# Patient Record
Sex: Male | Born: 1995 | Race: White | Hispanic: No | Marital: Married
Health system: Southern US, Community
[De-identification: ages and names within clinical notes are randomized; demographics above are authoritative.]

## PROBLEM LIST (undated history)

## (undated) DIAGNOSIS — F32A Depression, unspecified: Secondary | ICD-10-CM

## (undated) DIAGNOSIS — F319 Bipolar disorder, unspecified: Secondary | ICD-10-CM

## (undated) DIAGNOSIS — F419 Anxiety disorder, unspecified: Secondary | ICD-10-CM

## (undated) DIAGNOSIS — W5911XA Bitten by nonvenomous snake, initial encounter: Secondary | ICD-10-CM

## (undated) DIAGNOSIS — Z930 Tracheostomy status: Secondary | ICD-10-CM

## (undated) HISTORY — PX: ELBOW SURGERY: SHX618

## (undated) HISTORY — DX: Bipolar disorder, unspecified: F31.9

## (undated) HISTORY — PX: FOREARM SURGERY: SHX651

## (undated) HISTORY — DX: Tracheostomy status: Z93.0

## (undated) HISTORY — PX: SPLENECTOMY, TOTAL: SHX788

## (undated) HISTORY — PX: FRACTURE SURGERY: SHX138

## (undated) HISTORY — PX: SHOULDER SURGERY: SHX246

## (undated) HISTORY — PX: CRANIOTOMY: SHX93

---
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
## 2013-11-26 HISTORY — PX: FOOT SURGERY: SHX648

## 2014-05-11 ENCOUNTER — Emergency Department (HOSPITAL_COMMUNITY): Payer: 59

## 2014-05-11 ENCOUNTER — Encounter (HOSPITAL_COMMUNITY): Payer: Self-pay | Admitting: Emergency Medicine

## 2014-05-11 ENCOUNTER — Emergency Department (HOSPITAL_COMMUNITY)
Admission: EM | Admit: 2014-05-11 | Discharge: 2014-05-12 | Disposition: A | Discharge status: Home / Self Care | Payer: 59 | Attending: Emergency Medicine | Admitting: Emergency Medicine

## 2014-05-11 DIAGNOSIS — F1010 Alcohol abuse, uncomplicated: Secondary | ICD-10-CM | POA: Insufficient documentation

## 2014-05-11 DIAGNOSIS — S0003XA Contusion of scalp, initial encounter: Secondary | ICD-10-CM | POA: Insufficient documentation

## 2014-05-11 DIAGNOSIS — T148XXA Other injury of unspecified body region, initial encounter: Secondary | ICD-10-CM

## 2014-05-11 DIAGNOSIS — S199XXA Unspecified injury of neck, initial encounter: Secondary | ICD-10-CM

## 2014-05-11 DIAGNOSIS — F17200 Nicotine dependence, unspecified, uncomplicated: Secondary | ICD-10-CM | POA: Insufficient documentation

## 2014-05-11 DIAGNOSIS — IMO0002 Reserved for concepts with insufficient information to code with codable children: Secondary | ICD-10-CM | POA: Insufficient documentation

## 2014-05-11 DIAGNOSIS — S0993XA Unspecified injury of face, initial encounter: Secondary | ICD-10-CM | POA: Insufficient documentation

## 2014-05-11 DIAGNOSIS — S1093XA Contusion of unspecified part of neck, initial encounter: Principal | ICD-10-CM

## 2014-05-11 DIAGNOSIS — S0083XA Contusion of other part of head, initial encounter: Principal | ICD-10-CM | POA: Insufficient documentation
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
## 2014-05-12 MED ORDER — IBUPROFEN 600 MG PO TABS: 600.0000 mg | ORAL_TABLET | Freq: Four times a day (QID) | ORAL | Status: DC | PRN
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
## 2014-05-12 NOTE — ED Notes (Signed)
Discharge instructions reviewed with pt. Pt verbalized understanding.   

## 2015-11-27 HISTORY — PX: ORIF ANKLE FRACTURE BIMALLEOLAR: SUR920

## 2016-03-08 ENCOUNTER — Encounter (HOSPITAL_COMMUNITY): Payer: Self-pay

## 2016-03-08 ENCOUNTER — Emergency Department (HOSPITAL_COMMUNITY)
Admission: EM | Admit: 2016-03-08 | Discharge: 2016-03-09 | Disposition: A | Discharge status: Home / Self Care | Payer: No Typology Code available for payment source | Attending: Emergency Medicine | Admitting: Emergency Medicine

## 2016-03-08 ENCOUNTER — Emergency Department (HOSPITAL_COMMUNITY): Payer: No Typology Code available for payment source

## 2016-03-08 DIAGNOSIS — S022XXA Fracture of nasal bones, initial encounter for closed fracture: Secondary | ICD-10-CM

## 2016-03-08 DIAGNOSIS — F17200 Nicotine dependence, unspecified, uncomplicated: Secondary | ICD-10-CM | POA: Insufficient documentation

## 2016-03-08 DIAGNOSIS — K08419 Partial loss of teeth due to trauma, unspecified class: Secondary | ICD-10-CM | POA: Diagnosis not present

## 2016-03-08 DIAGNOSIS — Y9389 Activity, other specified: Secondary | ICD-10-CM | POA: Diagnosis not present

## 2016-03-08 DIAGNOSIS — S0181XA Laceration without foreign body of other part of head, initial encounter: Secondary | ICD-10-CM | POA: Insufficient documentation

## 2016-03-08 DIAGNOSIS — Y998 Other external cause status: Secondary | ICD-10-CM | POA: Diagnosis not present

## 2016-03-08 DIAGNOSIS — Y92410 Unspecified street and highway as the place of occurrence of the external cause: Secondary | ICD-10-CM | POA: Diagnosis not present

## 2016-03-08 DIAGNOSIS — S0992XA Unspecified injury of nose, initial encounter: Secondary | ICD-10-CM | POA: Diagnosis present

## 2016-03-08 NOTE — ED Notes (Signed)
Pt refusing IV access and phleb stick.
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
## 2016-03-09 MED ORDER — ACETAMINOPHEN 500 MG PO TABS
1000.0000 mg | ORAL_TABLET | Freq: Once | ORAL | Status: AC
  Administered 2016-03-09: 1000 mg via ORAL
  Filled 2016-03-09: qty 2
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
## 2016-03-09 NOTE — Discharge Instructions (Signed)
Nasal Fracture Mr. Hunter Cook, your CT scan results are below and show a broken nose.  See ENT within 3 days for close follow up.  Take tylenol or ibuprofen as needed for pain. If symptoms worsen, come back to the ED immediately. Thank you.   IMPRESSION: 1. Comminuted and mildly displaced nasal bone fractures bilaterally. Nasal septal fracture. Marked soft tissue swelling. 2. Negative for acute intracranial traumatic injury. Normal brain. 3. Negative for acute cervical spine fracture.   A fracture is a break in a bone. A nasal fracture is a broken nose. Minor breaks do not need treatment. Serious breaks may need surgery. HOME CARE  If directed, put ice on the injured area:  Put ice in a plastic bag.  Place a towel between your skin and the bag.  Leave the ice on for 20 minutes, 2-3 times per day.  Take over-the-counter and prescription medicines only as told by your doctor.  If your nose bleeds, sit up while you gently squeeze your nose shut for 10 minutes.  Try to not blow your nose.  Return to your normal activities as told by your doctor. Ask your doctor what activities are safe for you.  Do not play contact sports for 3-4 weeks or as told by your doctor.  Keep all follow-up visits as told by your doctor. This is important. GET HELP IF:  You have more pain or very bad pain.  You keep having nosebleeds.  The shape of your nose does not return to normal after 5 days.  You have pus coming out of your nose. GET HELP RIGHT AWAY IF:  Your nose bleeds for more than 20 minutes.  You have clear fluid draining out of your nose.  You have a grape-like swelling on the inside of your nose.  You have trouble moving your eyes.  You keep throwing up (vomiting).   This information is not intended to replace advice given to you by your health care provider. Make sure you discuss any questions you have with your health care provider.   Document Released: 08/21/2008 Document  Revised: 08/03/2015 Document Reviewed: 12/20/2014 Elsevier Interactive Patient Education [employer].

## 2016-03-09 NOTE — ED Notes (Signed)
Reported pain to Dr. Mora Bellmanni, MD gives verbal order for tylenol 1gm.

## 2016-03-09 NOTE — ED Notes (Signed)
Patient up and ambulatory without assistance. [facility] informed.

## 2016-10-16 ENCOUNTER — Emergency Department (HOSPITAL_COMMUNITY)
Admission: EM | Admit: 2016-10-16 | Discharge: 2016-10-16 | Disposition: A | Discharge status: Home / Self Care | Payer: BLUE CROSS/BLUE SHIELD | Attending: Emergency Medicine | Admitting: Emergency Medicine

## 2016-10-16 ENCOUNTER — Encounter (HOSPITAL_COMMUNITY): Payer: Self-pay

## 2016-10-16 ENCOUNTER — Emergency Department (HOSPITAL_COMMUNITY): Payer: BLUE CROSS/BLUE SHIELD

## 2016-10-16 ENCOUNTER — Ambulatory Visit (INDEPENDENT_AMBULATORY_CARE_PROVIDER_SITE_OTHER): Payer: Self-pay | Admitting: Orthopaedic Surgery

## 2016-10-16 DIAGNOSIS — Y939 Activity, unspecified: Secondary | ICD-10-CM | POA: Diagnosis not present

## 2016-10-16 DIAGNOSIS — Y929 Unspecified place or not applicable: Secondary | ICD-10-CM | POA: Diagnosis not present

## 2016-10-16 DIAGNOSIS — S82852A Displaced trimalleolar fracture of left lower leg, initial encounter for closed fracture: Secondary | ICD-10-CM | POA: Diagnosis not present

## 2016-10-16 DIAGNOSIS — Y999 Unspecified external cause status: Secondary | ICD-10-CM | POA: Insufficient documentation

## 2016-10-16 DIAGNOSIS — F17200 Nicotine dependence, unspecified, uncomplicated: Secondary | ICD-10-CM | POA: Diagnosis not present

## 2016-10-16 DIAGNOSIS — S99912A Unspecified injury of left ankle, initial encounter: Secondary | ICD-10-CM | POA: Diagnosis present

## 2016-10-16 MED ORDER — OXYCODONE-ACETAMINOPHEN 5-325 MG PO TABS
1.0000 | ORAL_TABLET | Freq: Once | ORAL | Status: AC
  Administered 2016-10-16: 1 via ORAL
  Filled 2016-10-16: qty 1

## 2016-10-16 MED ORDER — OXYCODONE-ACETAMINOPHEN 5-325 MG PO TABS: 1.0000 | ORAL_TABLET | ORAL | 0 refills | Status: DC | PRN

## 2016-10-16 MED ORDER — IBUPROFEN 600 MG PO TABS: 600.0000 mg | ORAL_TABLET | Freq: Four times a day (QID) | ORAL | 0 refills | Status: DC | PRN

## 2016-10-16 MED ORDER — ONDANSETRON HCL 4 MG/2ML IJ SOLN
4.0000 mg | Freq: Once | INTRAMUSCULAR | Status: AC
  Administered 2016-10-16: 4 mg via INTRAVENOUS
  Filled 2016-10-16: qty 2

## 2016-10-16 MED ORDER — HYDROMORPHONE HCL 2 MG/ML IJ SOLN
1.0000 mg | Freq: Once | INTRAMUSCULAR | Status: AC
  Administered 2016-10-16: 1 mg via INTRAVENOUS
  Filled 2016-10-16: qty 1

## 2016-10-16 NOTE — ED Notes (Signed)
PT ambulating in hallway with crutches with assistance from ortho tech
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
## 2016-10-16 NOTE — ED Notes (Signed)
ED Provider at bedside. 

## 2016-10-16 NOTE — Discharge Instructions (Signed)
Ice and elevate your ankle. Ibuprofen for pain and inflammation. Percocet for severe pain only. Do not put any weight on your ankle. Follow up with Dr. Ophelia CharterYates in the office.

## 2016-10-16 NOTE — ED Notes (Signed)
Ortho tech aware 

## 2016-10-16 NOTE — Progress Notes (Signed)
Orthopedic Tech Progress Note Patient Details:  Hunter SereneCody L Cook 01/08/1996 [id number]  Ortho Devices Type of Ortho Device: Ace wrap, Post (short leg) splint, Stirrup splint Ortho Device/Splint Location: lle Ortho Device/Splint Interventions: Application  crutches Nikki DomCrawford, Verne Lanuza 10/16/2016, 12:44 PM

## 2016-10-16 NOTE — ED Notes (Signed)
Assisted Ortho with applying splint.
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
## 2016-10-16 NOTE — ED Notes (Signed)
PT reports left toes are numb. Osie Bondatyana K., PA made aware. She returns to bedside to reevaluate foot and pulses.

## 2016-10-17 ENCOUNTER — Encounter (INDEPENDENT_AMBULATORY_CARE_PROVIDER_SITE_OTHER): Payer: Self-pay | Admitting: Orthopaedic Surgery

## 2016-10-17 ENCOUNTER — Other Ambulatory Visit (INDEPENDENT_AMBULATORY_CARE_PROVIDER_SITE_OTHER): Payer: Self-pay | Admitting: Orthopaedic Surgery

## 2016-10-17 ENCOUNTER — Ambulatory Visit (INDEPENDENT_AMBULATORY_CARE_PROVIDER_SITE_OTHER): Payer: BLUE CROSS/BLUE SHIELD | Admitting: Orthopaedic Surgery

## 2016-10-17 VITALS — BP 140/83 | HR 99 | Ht 67.0 in | Wt 200.0 lb

## 2016-10-17 DIAGNOSIS — S82842A Displaced bimalleolar fracture of left lower leg, initial encounter for closed fracture: Secondary | ICD-10-CM

## 2016-10-17 DIAGNOSIS — S82852A Displaced trimalleolar fracture of left lower leg, initial encounter for closed fracture: Secondary | ICD-10-CM

## 2016-10-17 HISTORY — DX: Displaced trimalleolar fracture of left lower leg, initial encounter for closed fracture: S82.852A

## 2016-10-17 MED ORDER — OXYCODONE-ACETAMINOPHEN 5-325 MG PO TABS: 1.0000 | ORAL_TABLET | Freq: Four times a day (QID) | ORAL | 0 refills | Status: DC | PRN

## 2016-10-17 NOTE — Addendum Note (Signed)
Addended by: Rogers SeedsYEATTS, Rosanna Bickle M on: 10/17/2016 12:48 PM   Modules accepted: Orders
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
## 2016-10-19 ENCOUNTER — Encounter (HOSPITAL_COMMUNITY): Payer: Self-pay | Admitting: *Deleted

## 2016-10-19 NOTE — Progress Notes (Signed)
Spoke with pt for pre-op call. Pt denies cardiac history. Pt states he is not diabetic.  

## 2016-10-22 ENCOUNTER — Observation Stay (HOSPITAL_COMMUNITY)
Admission: RE | Admit: 2016-10-22 | Discharge: 2016-10-23 | Disposition: A | Discharge status: Home / Self Care | Payer: BLUE CROSS/BLUE SHIELD | Source: Ambulatory Visit | Attending: Orthopaedic Surgery | Admitting: Orthopaedic Surgery

## 2016-10-22 ENCOUNTER — Ambulatory Visit (HOSPITAL_COMMUNITY): Payer: BLUE CROSS/BLUE SHIELD

## 2016-10-22 ENCOUNTER — Encounter (HOSPITAL_COMMUNITY): Payer: Self-pay | Admitting: *Deleted

## 2016-10-22 ENCOUNTER — Ambulatory Visit (HOSPITAL_COMMUNITY): Payer: BLUE CROSS/BLUE SHIELD | Admitting: Anesthesiology

## 2016-10-22 ENCOUNTER — Encounter (HOSPITAL_COMMUNITY)
Admission: RE | Disposition: A | Discharge status: Home / Self Care | Payer: Self-pay | Source: Ambulatory Visit | Attending: Orthopaedic Surgery

## 2016-10-22 DIAGNOSIS — S82892A Other fracture of left lower leg, initial encounter for closed fracture: Secondary | ICD-10-CM | POA: Insufficient documentation

## 2016-10-22 DIAGNOSIS — Z419 Encounter for procedure for purposes other than remedying health state, unspecified: Secondary | ICD-10-CM

## 2016-10-22 DIAGNOSIS — F319 Bipolar disorder, unspecified: Secondary | ICD-10-CM | POA: Insufficient documentation

## 2016-10-22 DIAGNOSIS — Z8249 Family history of ischemic heart disease and other diseases of the circulatory system: Secondary | ICD-10-CM | POA: Insufficient documentation

## 2016-10-22 DIAGNOSIS — X58XXXA Exposure to other specified factors, initial encounter: Secondary | ICD-10-CM | POA: Insufficient documentation

## 2016-10-22 DIAGNOSIS — S82842A Displaced bimalleolar fracture of left lower leg, initial encounter for closed fracture: Secondary | ICD-10-CM

## 2016-10-22 DIAGNOSIS — S82852A Displaced trimalleolar fracture of left lower leg, initial encounter for closed fracture: Secondary | ICD-10-CM | POA: Diagnosis not present

## 2016-10-22 DIAGNOSIS — S93432A Sprain of tibiofibular ligament of left ankle, initial encounter: Secondary | ICD-10-CM | POA: Diagnosis present

## 2016-10-22 DIAGNOSIS — Z7982 Long term (current) use of aspirin: Secondary | ICD-10-CM | POA: Diagnosis not present

## 2016-10-22 DIAGNOSIS — F17210 Nicotine dependence, cigarettes, uncomplicated: Secondary | ICD-10-CM | POA: Diagnosis not present

## 2016-10-22 HISTORY — DX: Other fracture of left lower leg, initial encounter for closed fracture: S82.892A

## 2016-10-22 HISTORY — PX: ORIF ANKLE FRACTURE BIMALLEOLAR: SUR920

## 2016-10-22 HISTORY — PX: ORIF ANKLE FRACTURE: SHX5408

## 2016-10-22 LAB — BASIC METABOLIC PANEL
ANION GAP: 10 (ref 5–15)
BUN: 13 mg/dL (ref 6–20)
CO2: 24 mmol/L (ref 22–32)
Calcium: 9.6 mg/dL (ref 8.9–10.3)
Chloride: 103 mmol/L (ref 101–111)
Creatinine, Ser: 0.75 mg/dL (ref 0.61–1.24)
Glucose, Bld: 95 mg/dL (ref 65–99)
POTASSIUM: 4.1 mmol/L (ref 3.5–5.1)
SODIUM: 137 mmol/L (ref 135–145)

## 2016-10-22 LAB — SURGICAL PCR SCREEN
MRSA, PCR: NEGATIVE
STAPHYLOCOCCUS AUREUS: POSITIVE — AB

## 2016-10-22 LAB — CBC
HEMATOCRIT: 39.8 % (ref 39.0–52.0)
HEMOGLOBIN: 13.7 g/dL (ref 13.0–17.0)
MCH: 30.3 pg (ref 26.0–34.0)
MCHC: 34.4 g/dL (ref 30.0–36.0)
MCV: 88.1 fL (ref 78.0–100.0)
Platelets: 294 10*3/uL (ref 150–400)
RBC: 4.52 MIL/uL (ref 4.22–5.81)
RDW: 12.3 % (ref 11.5–15.5)
WBC: 8.2 10*3/uL (ref 4.0–10.5)

## 2016-10-22 SURGERY — OPEN REDUCTION INTERNAL FIXATION (ORIF) ANKLE FRACTURE
Anesthesia: General | Site: Ankle | Laterality: Left

## 2016-10-22 MED ORDER — METOCLOPRAMIDE HCL 5 MG PO TABS: 5.0000 mg | ORAL_TABLET | Freq: Three times a day (TID) | ORAL | Status: DC | PRN

## 2016-10-22 MED ORDER — ACETAMINOPHEN 650 MG RE SUPP: 650.0000 mg | Freq: Four times a day (QID) | RECTAL | Status: DC | PRN

## 2016-10-22 MED ORDER — FENTANYL CITRATE (PF) 100 MCG/2ML IJ SOLN
INTRAMUSCULAR | Status: AC
  Filled 2016-10-22: qty 4

## 2016-10-22 MED ORDER — ONDANSETRON HCL 4 MG PO TABS: 4.0000 mg | ORAL_TABLET | Freq: Four times a day (QID) | ORAL | Status: DC | PRN

## 2016-10-22 MED ORDER — CEFAZOLIN SODIUM-DEXTROSE 2-4 GM/100ML-% IV SOLN: 2.0000 g | INTRAVENOUS | Status: DC

## 2016-10-22 MED ORDER — FENTANYL CITRATE (PF) 100 MCG/2ML IJ SOLN
INTRAMUSCULAR | Status: DC | PRN
  Administered 2016-10-22 (×2): 50 ug via INTRAVENOUS
  Administered 2016-10-22: 25 ug via INTRAVENOUS
  Administered 2016-10-22 (×5): 50 ug via INTRAVENOUS
  Administered 2016-10-22: 25 ug via INTRAVENOUS

## 2016-10-22 MED ORDER — ACETAMINOPHEN 10 MG/ML IV SOLN
INTRAVENOUS | Status: AC
  Administered 2016-10-22: 1000 mg
  Filled 2016-10-22: qty 100

## 2016-10-22 MED ORDER — ACETAMINOPHEN 325 MG PO TABS: 650.0000 mg | ORAL_TABLET | Freq: Four times a day (QID) | ORAL | Status: DC | PRN

## 2016-10-22 MED ORDER — 0.9 % SODIUM CHLORIDE (POUR BTL) OPTIME
TOPICAL | Status: DC | PRN
  Administered 2016-10-22: 1000 mL

## 2016-10-22 MED ORDER — DOCUSATE SODIUM 100 MG PO CAPS
100.0000 mg | ORAL_CAPSULE | Freq: Two times a day (BID) | ORAL | Status: DC
  Administered 2016-10-22 – 2016-10-23 (×2): 100 mg via ORAL
  Filled 2016-10-22 (×2): qty 1

## 2016-10-22 MED ORDER — GLYCOPYRROLATE 0.2 MG/ML IJ SOLN
INTRAMUSCULAR | Status: DC | PRN
  Administered 2016-10-22: 0.4 mg via INTRAVENOUS

## 2016-10-22 MED ORDER — MIDAZOLAM HCL 2 MG/2ML IJ SOLN
1.0000 mg | Freq: Once | INTRAMUSCULAR | Status: AC
  Administered 2016-10-22: 2 mg via INTRAVENOUS

## 2016-10-22 MED ORDER — ASPIRIN 325 MG PO TABS
325.0000 mg | ORAL_TABLET | Freq: Every day | ORAL | Status: DC
  Administered 2016-10-23: 325 mg via ORAL
  Filled 2016-10-22: qty 1

## 2016-10-22 MED ORDER — METHOCARBAMOL 500 MG PO TABS
500.0000 mg | ORAL_TABLET | Freq: Four times a day (QID) | ORAL | Status: DC | PRN
  Administered 2016-10-22 – 2016-10-23 (×2): 500 mg via ORAL
  Filled 2016-10-22 (×2): qty 1

## 2016-10-22 MED ORDER — METHOCARBAMOL 1000 MG/10ML IJ SOLN
500.0000 mg | Freq: Four times a day (QID) | INTRAVENOUS | Status: DC | PRN
  Administered 2016-10-22: 500 mg via INTRAVENOUS
  Filled 2016-10-22 (×2): qty 5

## 2016-10-22 MED ORDER — CEFAZOLIN SODIUM-DEXTROSE 2-4 GM/100ML-% IV SOLN
INTRAVENOUS | Status: AC
  Filled 2016-10-22: qty 100

## 2016-10-22 MED ORDER — ONDANSETRON HCL 4 MG/2ML IJ SOLN: 4.0000 mg | Freq: Four times a day (QID) | INTRAMUSCULAR | Status: DC | PRN

## 2016-10-22 MED ORDER — PROPOFOL 10 MG/ML IV BOLUS
INTRAVENOUS | Status: DC | PRN
  Administered 2016-10-22: 200 mg via INTRAVENOUS

## 2016-10-22 MED ORDER — MIDAZOLAM HCL 5 MG/5ML IJ SOLN
INTRAMUSCULAR | Status: DC | PRN
  Administered 2016-10-22: 2 mg via INTRAVENOUS

## 2016-10-22 MED ORDER — FENTANYL CITRATE (PF) 100 MCG/2ML IJ SOLN
INTRAMUSCULAR | Status: AC
  Administered 2016-10-22: 100 ug via INTRAVENOUS
  Filled 2016-10-22: qty 2

## 2016-10-22 MED ORDER — HYDROMORPHONE HCL 1 MG/ML IJ SOLN
0.2500 mg | INTRAMUSCULAR | Status: DC | PRN
  Administered 2016-10-22: 0.5 mg via INTRAVENOUS

## 2016-10-22 MED ORDER — FENTANYL CITRATE (PF) 100 MCG/2ML IJ SOLN
INTRAMUSCULAR | Status: AC
  Filled 2016-10-22: qty 2

## 2016-10-22 MED ORDER — CEFAZOLIN SODIUM-DEXTROSE 2-4 GM/100ML-% IV SOLN
2.0000 g | INTRAVENOUS | Status: AC
  Administered 2016-10-22: 2 g via INTRAVENOUS

## 2016-10-22 MED ORDER — KETOROLAC TROMETHAMINE 30 MG/ML IJ SOLN
30.0000 mg | Freq: Four times a day (QID) | INTRAMUSCULAR | Status: DC | PRN
  Administered 2016-10-22: 30 mg via INTRAVENOUS
  Filled 2016-10-22: qty 1

## 2016-10-22 MED ORDER — BUPIVACAINE HCL (PF) 0.25 % IJ SOLN
INTRAMUSCULAR | Status: DC | PRN
Start: 2016-10-22 — End: 2016-10-22
  Administered 2016-10-22: 18 mL

## 2016-10-22 MED ORDER — HYDROMORPHONE HCL 1 MG/ML IJ SOLN
INTRAMUSCULAR | Status: AC
  Filled 2016-10-22: qty 1

## 2016-10-22 MED ORDER — MIDAZOLAM HCL 2 MG/2ML IJ SOLN
INTRAMUSCULAR | Status: AC
  Administered 2016-10-22: 2 mg via INTRAVENOUS
  Filled 2016-10-22: qty 2

## 2016-10-22 MED ORDER — ONDANSETRON HCL 4 MG/2ML IJ SOLN
INTRAMUSCULAR | Status: DC | PRN
  Administered 2016-10-22: 4 mg via INTRAVENOUS

## 2016-10-22 MED ORDER — BUPIVACAINE HCL (PF) 0.25 % IJ SOLN
INTRAMUSCULAR | Status: AC
  Filled 2016-10-22: qty 30

## 2016-10-22 MED ORDER — OXYCODONE HCL 5 MG PO TABS
ORAL_TABLET | ORAL | Status: AC
  Administered 2016-10-22: 10 mg via ORAL
  Filled 2016-10-22: qty 10

## 2016-10-22 MED ORDER — METOCLOPRAMIDE HCL 5 MG/ML IJ SOLN: 5.0000 mg | Freq: Three times a day (TID) | INTRAMUSCULAR | Status: DC | PRN

## 2016-10-22 MED ORDER — MIDAZOLAM HCL 2 MG/2ML IJ SOLN
INTRAMUSCULAR | Status: AC
  Filled 2016-10-22: qty 2

## 2016-10-22 MED ORDER — CHLORHEXIDINE GLUCONATE 4 % EX LIQD: 60.0000 mL | Freq: Once | CUTANEOUS | Status: DC

## 2016-10-22 MED ORDER — CEFAZOLIN IN D5W 1 GM/50ML IV SOLN
1.0000 g | Freq: Three times a day (TID) | INTRAVENOUS | Status: AC
Start: 2016-10-22 — End: 2016-10-23
  Administered 2016-10-22 – 2016-10-23 (×2): 1 g via INTRAVENOUS
  Filled 2016-10-22 (×2): qty 50

## 2016-10-22 MED ORDER — DIVALPROEX SODIUM ER 500 MG PO TB24
500.0000 mg | ORAL_TABLET | Freq: Every day | ORAL | Status: DC
  Administered 2016-10-22 – 2016-10-23 (×2): 500 mg via ORAL
  Filled 2016-10-22 (×2): qty 1

## 2016-10-22 MED ORDER — ROCURONIUM BROMIDE 100 MG/10ML IV SOLN
INTRAVENOUS | Status: DC | PRN
  Administered 2016-10-22: 50 mg via INTRAVENOUS

## 2016-10-22 MED ORDER — DEXAMETHASONE SODIUM PHOSPHATE 10 MG/ML IJ SOLN
INTRAMUSCULAR | Status: DC | PRN
  Administered 2016-10-22: 10 mg via INTRAVENOUS

## 2016-10-22 MED ORDER — PROPOFOL 10 MG/ML IV BOLUS
INTRAVENOUS | Status: AC
  Filled 2016-10-22: qty 20

## 2016-10-22 MED ORDER — SODIUM CHLORIDE 0.9 % IV SOLN
INTRAVENOUS | Status: DC
  Administered 2016-10-22: 17:00:00 via INTRAVENOUS

## 2016-10-22 MED ORDER — FENTANYL CITRATE (PF) 100 MCG/2ML IJ SOLN
50.0000 ug | Freq: Once | INTRAMUSCULAR | Status: AC
  Administered 2016-10-22: 100 ug via INTRAVENOUS

## 2016-10-22 MED ORDER — OXYCODONE HCL 5 MG PO TABS
5.0000 mg | ORAL_TABLET | ORAL | Status: DC | PRN
  Administered 2016-10-22 – 2016-10-23 (×5): 10 mg via ORAL
  Filled 2016-10-22 (×5): qty 2

## 2016-10-22 MED ORDER — LIDOCAINE HCL (CARDIAC) 20 MG/ML IV SOLN
INTRAVENOUS | Status: DC | PRN
  Administered 2016-10-22: 60 mg via INTRAVENOUS

## 2016-10-22 MED ORDER — OXYCODONE HCL 5 MG PO TABS: 5.0000 mg | ORAL_TABLET | Freq: Once | ORAL | Status: DC | PRN

## 2016-10-22 MED ORDER — OXYCODONE HCL 5 MG/5ML PO SOLN: 5.0000 mg | Freq: Once | ORAL | Status: DC | PRN

## 2016-10-22 MED ORDER — LACTATED RINGERS IV SOLN
INTRAVENOUS | Status: DC
  Administered 2016-10-22 (×2): via INTRAVENOUS

## 2016-10-22 MED ORDER — HYDROMORPHONE HCL 2 MG/ML IJ SOLN
1.0000 mg | INTRAMUSCULAR | Status: DC | PRN
  Administered 2016-10-22 – 2016-10-23 (×5): 1 mg via INTRAVENOUS
  Filled 2016-10-22 (×6): qty 1

## 2016-10-22 MED ORDER — NEOSTIGMINE METHYLSULFATE 10 MG/10ML IV SOLN
INTRAVENOUS | Status: DC | PRN
  Administered 2016-10-22: 3 mg via INTRAVENOUS

## 2016-10-22 SURGICAL SUPPLY — 68 items
BANDAGE ACE 4X5 VEL STRL LF (GAUZE/BANDAGES/DRESSINGS) ×3 IMPLANT
BANDAGE ACE 6X5 VEL STRL LF (GAUZE/BANDAGES/DRESSINGS) ×3 IMPLANT
BANDAGE ESMARK 6X9 LF (GAUZE/BANDAGES/DRESSINGS) IMPLANT
BIT DRILL 110X2.5XQCK CNCT (BIT) ×1 IMPLANT
BIT DRILL 2.5 (BIT) ×2
BIT DRILL 2.7XCANN QCK CNCT (BIT) ×1 IMPLANT
BIT DRILL CANN 2.7 (BIT) ×1
BIT DRILL CANN 2.7MM (BIT) ×1
BIT DRL 110X2.5XQCK CNCT (BIT) ×1
BIT DRL 2.7XCANN QCK CNCT (BIT) ×1
BNDG ESMARK 6X9 LF (GAUZE/BANDAGES/DRESSINGS)
COVER MAYO STAND STRL (DRAPES) IMPLANT
COVER SURGICAL LIGHT HANDLE (MISCELLANEOUS) ×3 IMPLANT
CUFF TOURNIQUET SINGLE 34IN LL (TOURNIQUET CUFF) ×3 IMPLANT
CUFF TOURNIQUET SINGLE 44IN (TOURNIQUET CUFF) IMPLANT
DRAPE C-ARM 42X72 X-RAY (DRAPES) IMPLANT
DRAPE INCISE IOBAN 66X45 STRL (DRAPES) ×3 IMPLANT
DRAPE PROXIMA HALF (DRAPES) IMPLANT
DRAPE U-SHAPE 47X51 STRL (DRAPES) ×3 IMPLANT
DRSG PAD ABDOMINAL 8X10 ST (GAUZE/BANDAGES/DRESSINGS) ×6 IMPLANT
DURAPREP 26ML APPLICATOR (WOUND CARE) ×3 IMPLANT
ELECT REM PT RETURN 9FT ADLT (ELECTROSURGICAL) ×3
ELECTRODE REM PT RTRN 9FT ADLT (ELECTROSURGICAL) ×1 IMPLANT
GAUZE SPONGE 4X4 12PLY STRL (GAUZE/BANDAGES/DRESSINGS) ×6 IMPLANT
GAUZE XEROFORM 5X9 LF (GAUZE/BANDAGES/DRESSINGS) ×3 IMPLANT
GLOVE BIOGEL PI IND STRL 6.5 (GLOVE) ×2 IMPLANT
GLOVE BIOGEL PI IND STRL 8 (GLOVE) ×2 IMPLANT
GLOVE BIOGEL PI INDICATOR 6.5 (GLOVE) ×4
GLOVE BIOGEL PI INDICATOR 8 (GLOVE) ×4
GLOVE ORTHO TXT STRL SZ7.5 (GLOVE) ×6 IMPLANT
GLOVE SURG SS PI 6.5 STRL IVOR (GLOVE) ×3 IMPLANT
GOWN STRL REUS W/ TWL LRG LVL3 (GOWN DISPOSABLE) ×1 IMPLANT
GOWN STRL REUS W/ TWL XL LVL3 (GOWN DISPOSABLE) ×1 IMPLANT
GOWN STRL REUS W/TWL 2XL LVL3 (GOWN DISPOSABLE) ×3 IMPLANT
GOWN STRL REUS W/TWL LRG LVL3 (GOWN DISPOSABLE) ×2
GOWN STRL REUS W/TWL XL LVL3 (GOWN DISPOSABLE) ×2
GUIDEWIRE PIN ORTH 6X1.6XSMTH (WIRE) ×2 IMPLANT
K-WIRE 1.6 (WIRE) ×4
KIT BASIN OR (CUSTOM PROCEDURE TRAY) ×3 IMPLANT
KIT ROOM TURNOVER OR (KITS) ×3 IMPLANT
MANIFOLD NEPTUNE II (INSTRUMENTS) ×3 IMPLANT
NS IRRIG 1000ML POUR BTL (IV SOLUTION) ×3 IMPLANT
PACK ORTHO EXTREMITY (CUSTOM PROCEDURE TRAY) ×3 IMPLANT
PAD ARMBOARD 7.5X6 YLW CONV (MISCELLANEOUS) ×6 IMPLANT
PAD CAST 4YDX4 CTTN HI CHSV (CAST SUPPLIES) ×1 IMPLANT
PADDING CAST COTTON 4X4 STRL (CAST SUPPLIES) ×2
PADDING CAST COTTON 6X4 STRL (CAST SUPPLIES) ×6 IMPLANT
PLATE 7HOLE 1/3 TUBULAR (Plate) ×3 IMPLANT
SCREW CANC PARTIAL THREAD 4.0 (Screw) ×3 IMPLANT
SCREW CANCELLOUS 35MM 4.0 (Screw) ×2 IMPLANT
SCREW CANCELLOUS 4.0X10MM (Screw) ×3 IMPLANT
SCREW CANN 1/3 THREAD 4.0X40MM (Screw) ×6 IMPLANT
SCREW CORTICAL 3.5 18MM (Screw) ×3 IMPLANT
SCREW CORTICAL 3.5X10 (Screw) ×9 IMPLANT
SCREW CORTICAL 3.5X12 (Screw) ×9 IMPLANT
SPONGE LAP 18X18 X RAY DECT (DISPOSABLE) ×3 IMPLANT
STAPLER VISISTAT 35W (STAPLE) ×3 IMPLANT
SUCTION FRAZIER HANDLE 10FR (MISCELLANEOUS) ×2
SUCTION TUBE FRAZIER 10FR DISP (MISCELLANEOUS) ×1 IMPLANT
SUT ETHILON 3 0 PS 1 (SUTURE) IMPLANT
SUT VIC AB 2-0 CT1 27 (SUTURE) ×4
SUT VIC AB 2-0 CT1 TAPERPNT 27 (SUTURE) ×2 IMPLANT
TOWEL OR 17X24 6PK STRL BLUE (TOWEL DISPOSABLE) ×3 IMPLANT
TOWEL OR 17X26 10 PK STRL BLUE (TOWEL DISPOSABLE) ×3 IMPLANT
TUBE CONNECTING 12'X1/4 (SUCTIONS) ×1
TUBE CONNECTING 12X1/4 (SUCTIONS) ×2 IMPLANT
WATER STERILE IRR 1000ML POUR (IV SOLUTION) ×3 IMPLANT
YANKAUER SUCT BULB TIP NO VENT (SUCTIONS) ×3 IMPLANT

## 2016-10-22 NOTE — Interval H&P Note (Signed)
History and Physical Interval Note:  10/22/2016 11:56 AM  Hunter Cook  has presented today for surgery, with the diagnosis of LEFT BIMALLEOLAR ANKLE FRACTURE  The various methods of treatment have been discussed with the patient and family. After consideration of risks, benefits and other options for treatment, the patient has consented to  Procedure(s): OPEN REDUCTION INTERNAL FIXATION (ORIF) BIMALLEOLAR ANKLE FRACTURE (Left) as a surgical intervention .  The patient's history has been reviewed, patient examined, no change in status, stable for surgery.  I have reviewed the patient's chart and labs.  Questions were answered to the patient's satisfaction.     Eldred MangesMark C Emory Leaver

## 2016-10-22 NOTE — Op Note (Signed)
Preop diagnosis: Left trimalleolar ankle fracture, possible syndesmosis disruption  Postop diagnosis: Left trimalleolar ankle fracture with syndesmosis rupture.  Procedure: ORIF bimalleolar fixation of trimalleolar ankle fracture (fixation of medial and lateral malleolus, posterior malleolus did not require fixation) open reduction and syndesmosis fixation with syndesmotic screw.  Surgeon: Annell GreeningMark Dezerae Freiberger M.D.  Assistant: Zonia KiefJames Owens PA-C present for the entire procedure  Anesthesia: General anesthesia plus Marcaine local  Tourniquet: 350 pressure 44 minutes  Procedure: After induction general anesthesia preoperative Ancef prophylaxis DuraPrep with proximal thigh tourniquet the usual extremity sheets and drapes were applied timeout procedure was completed. Incision was made laterally fibula was reduced held with self-retaining clamps and a 7 hole one third tubular plate was placed with fixation followed by interfrag screw. Second from the most distal screw hole was left open for syndesmotic screw placement.  Small incision was made medially over the medial malleolus it was reduced and fixed with 240 mm 40 cancellus partially threaded screws. C-arm was used intermittently to check screw length and screw position in fracture position. Syndesmosis was widened and a partially threaded 45 mm cancellus lag screw was placed through the second hole from the bottom on the fibular plate through both cortices of the fibula and into the metaphyseal region of the tibia with pressure applied between the medial lateral malleolus closing the syndesmosis and tightening the screw down securely. Zonia KiefJames Owens PA-C was used for compression and holding the syndesmosis closest screw was tightened down securely. The 2 K wires for the medial fixation medial malleolus were then removed and AP lateral mortise and oblique spot films were taken for confirmation of reduction of the syndesmosis anatomic fixation of the fibula except  for a small medial butterfly sliver too small for screw that was posterior medial. Syndesmosis was closed and ankle was in dorsiflexion is a syndesmosis was drilled and tightened. Tourniquet was deflated hemostasis was obtained all wounds were irrigated and then subtendinous tissue reapproximated with 2-0 Vicryl followed by skin staples and then a short leg splint.
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
## 2016-10-22 NOTE — Progress Notes (Signed)
patient complaining of pain, states  " If I'm getting the same thing here that I'm taking at home then I just want to go home.  What's the use", and stated  I don't want to take it. "just nevermind".  Encouraged pt to take osy and then would give him dilaudid in about 30 minutes when it's closer to the time that it's due.  Pt declined. Informed him I will notify MD.
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
## 2016-10-22 NOTE — Transfer of Care (Signed)
Immediate Anesthesia Transfer of Care Note  Patient: Hunter Cook  Procedure(s) Performed: Procedure(s): OPEN REDUCTION INTERNAL FIXATION (ORIF) BIMALLEOLAR ANKLE FRACTURE (Left)  Patient Location: PACU  Anesthesia Type:General  Level of Consciousness: awake, alert , oriented and patient cooperative  Airway & Oxygen Therapy: Patient Spontanous Breathing  Post-op Assessment: Report given to RN and Post -op Vital signs reviewed and stable  Post vital signs: Reviewed and stable  Last Vitals:  Vitals:   10/22/16 0937  BP: (!) 151/83  Pulse: 82  Resp: 18  Temp: 36.6 C    Last Pain:  Vitals:   10/22/16 1035  TempSrc:   PainSc: 10-Worst pain ever      Patients Stated Pain Goal: 5 (10/22/16 1035)  Complications: No apparent anesthesia complications

## 2016-10-22 NOTE — Brief Op Note (Signed)
10/22/2016  2:30 PM  PATIENT:  Hunter Cook  20 y.o. male  PRE-OPERATIVE DIAGNOSIS:  LEFT BIMALLEOLAR ANKLE FRACTURE  POST-OPERATIVE DIAGNOSIS:  LEFT BIMALLEOLAR ANKLE FRACTURE  PROCEDURE:  Procedure(s): OPEN REDUCTION INTERNAL FIXATION (ORIF) BIMALLEOLAR ANKLE FRACTURE (Left)  SURGEON:  Surgeon(s) and Role:    * Eldred MangesMark C Yates, MD - Primary  PHYSICIAN ASSISTANT: Zonia Kiefjames Masen Salvas pa-c    ANESTHESIA:   general  EBL:  Total I/O In: 800 [I.V.:800] Out: 40 [Blood:40]  BLOOD ADMINISTERED:none  DRAINS: none   LOCAL MEDICATIONS USED:  MARCAINE     SPECIMEN:  No Specimen  DISPOSITION OF SPECIMEN:  N/A  COUNTS:  YES  TOURNIQUET:   Total Tourniquet Time Documented: Thigh (Left) - 45 minutes Total: Thigh (Left) - 45 minutes   DICTATION: .Reubin Milanragon Dictation  PLAN OF CARE: Admit for overnight observation  PATIENT DISPOSITION:  PACU - hemodynamically stable.

## 2016-10-22 NOTE — Anesthesia Postprocedure Evaluation (Signed)
Anesthesia Post Note  Patient: Vicente SereneCody L Fleury  Procedure(s) Performed: Procedure(s) (LRB): OPEN REDUCTION INTERNAL FIXATION (ORIF) BIMALLEOLAR ANKLE FRACTURE (Left)  Patient location during evaluation: PACU Anesthesia Type: General Level of consciousness: awake and alert Pain management: pain level controlled Vital Signs Assessment: post-procedure vital signs reviewed and stable Respiratory status: spontaneous breathing, nonlabored ventilation, respiratory function stable and patient connected to nasal cannula oxygen Cardiovascular status: blood pressure returned to baseline and stable Postop Assessment: no signs of nausea or vomiting Anesthetic complications: no    Last Vitals:  Vitals:   10/22/16 1600 10/22/16 1605  BP:  139/89  Pulse: 73 (!) 51  Resp: 12 12  Temp:  36.6 C    Last Pain:  Vitals:   10/22/16 1600  TempSrc:   PainSc: 4                  Kennieth RadFitzgerald, Prentice Sackrider E

## 2016-10-22 NOTE — Progress Notes (Signed)
Due to patients anxiety and pain 2 versed and 2 fentanyl was given prior to surgery with verbal from Dr. Chaney MallingHodierne
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
## 2016-10-23 DIAGNOSIS — S82852A Displaced trimalleolar fracture of left lower leg, initial encounter for closed fracture: Secondary | ICD-10-CM | POA: Diagnosis not present

## 2016-10-23 LAB — BASIC METABOLIC PANEL
Anion gap: 10 (ref 5–15)
BUN: 14 mg/dL (ref 6–20)
CO2: 27 mmol/L (ref 22–32)
CREATININE: 0.79 mg/dL (ref 0.61–1.24)
Calcium: 9.8 mg/dL (ref 8.9–10.3)
Chloride: 101 mmol/L (ref 101–111)
GFR calc Af Amer: >60 mL/min (ref >60)
Glucose, Bld: 111 mg/dL — ABNORMAL HIGH (ref 65–99)
Potassium: 4.2 mmol/L (ref 3.5–5.1)
SODIUM: 138 mmol/L (ref 135–145)

## 2016-10-23 MED ORDER — ASPIRIN 325 MG PO TABS: 325.0000 mg | ORAL_TABLET | Freq: Every day | ORAL | Status: DC

## 2016-10-23 MED ORDER — OXYCODONE-ACETAMINOPHEN 5-325 MG PO TABS: 1.0000 | ORAL_TABLET | ORAL | 0 refills | Status: DC | PRN

## 2016-10-23 NOTE — Care Management Note (Signed)
Case Management Note  Patient Details  Name: Hunter Cook MRN: [id number] Date of Birth: 10/24/1996  Subjective/Objective:   [age] yr. old  Male s/p Left ankle ORIF.              Action/Plan: Case manager spoke with patient concerning discharge plan. Patient has no home health needs.     Expected Discharge Date:    10/23/16             Expected Discharge Plan:  Home/Self Care  In-House Referral:  NA  Discharge planning Services  CM Consult  Post Acute Care Choice:  Durable Medical Equipment Choice offered to:  Patient  DME Arranged:  Crutches DME Agency:  NA  HH Arranged:  NA HH Agency:  NA  Status of Service:  Completed, signed off  If discussed at Long Length of Stay Meetings, dates discussed:    Additional Comments:  Durenda GuthrieBrady, Kayston Jodoin Naomi, RN 10/23/2016, 10:50 AM

## 2016-10-23 NOTE — Progress Notes (Signed)
   Subjective: 1 Day Post-Op Procedure(s) (LRB): OPEN REDUCTION INTERNAL FIXATION (ORIF) BIMALLEOLAR ANKLE FRACTURE (Left) Patient reports pain as moderate and severe.  " this pain medicine does not work, it makes me itch, Hydro 5's don't work for me either, I don't like pain, get me out of this hospital"  Objective: Vital signs in last 24 hours: Temp:  [97.9 F (36.6 C)-98.6 F (37 C)] 97.9 F (36.6 C) (11/28 0416) Pulse Rate:  [49-92] 70 (11/28 0416) Resp:  [6-18] 16 (11/28 0416) BP: (127-161)/(68-103) 136/81 (11/28 0416) SpO2:  [96 %-100 %] 98 % (11/28 0416) Weight:  [200 lb (90.7 kg)] 200 lb (90.7 kg) (11/27 0937)  Intake/Output from previous day: 11/27 0701 - 11/28 0700 In: 2033.8 [P.O.:360; I.V.:1568.8; IV Piggyback:105] Out: 540 [Urine:500; Blood:40] Intake/Output this shift: No intake/output data recorded.   Recent Labs  10/22/16 1012  HGB 13.7    Recent Labs  10/22/16 1012  WBC 8.2  RBC 4.52  HCT 39.8  PLT 294    Recent Labs  10/22/16 1012 10/23/16 0401  NA 137 138  K 4.1 4.2  CL 103 101  CO2 24 27  BUN 13 14  CREATININE 0.75 0.79  GLUCOSE 95 111*  CALCIUM 9.6 9.8   No results for input(s): LABPT, INR in the last 72 hours.  Neurologically intact Dg Ankle Complete Left  Result Date: 10/22/2016 CLINICAL DATA:  ORIF of left ankle fracture FLUOROSCOPY TIME:  37 seconds. Images: 5 EXAM: LEFT ANKLE COMPLETE - 3+ VIEW; DG C-ARM 61-120 MIN COMPARISON:  None. FINDINGS: By the end of the study, 2 screws have been placed through the medial malleolus with plate and screws crossing the distal fibular fracture. A single screw traverses both the fibula and lateral aspect of the distal tibia. Hardware is in good position by the end of the study. IMPRESSION: ORIF of left ankle as above. Electronically Signed   By: Gerome Samavid  Williams III M.D   On: 10/22/2016 13:53   Dg C-arm 1-60 Min  Result Date: 10/22/2016 CLINICAL DATA:  ORIF of left ankle fracture FLUOROSCOPY  TIME:  37 seconds. Images: 5 EXAM: LEFT ANKLE COMPLETE - 3+ VIEW; DG C-ARM 61-120 MIN COMPARISON:  None. FINDINGS: By the end of the study, 2 screws have been placed through the medial malleolus with plate and screws crossing the distal fibular fracture. A single screw traverses both the fibula and lateral aspect of the distal tibia. Hardware is in good position by the end of the study. IMPRESSION: ORIF of left ankle as above. Electronically Signed   By: Gerome Samavid  Williams III M.D   On: 10/22/2016 13:53    Assessment/Plan: 1 Day Post-Op Procedure(s) (LRB): OPEN REDUCTION INTERNAL FIXATION (ORIF) BIMALLEOLAR ANKLE FRACTURE (Left) Plan: discharge Home with walker. Office one week  Eldred MangesMark C Laekyn Rayos 10/23/2016, 7:28 AM
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
## 2016-10-23 NOTE — Progress Notes (Addendum)
Pt insistent on being discharged. Refuses to work with therapy or to speak with a CM, stating "I don't need that, I move fine. What exercises could they possibly teach me? My grandma has crutches for me to use." Reinforced safety indications for working with trained therapists. Education/instructions reviewed with pt and prescriptions given. All questions/concerns addressed. IV removed and belongings gathered. Pt will be transported out via wheelchair to grandmother's vehicle. Will continue to monitor  After pt had time to calm down and do some relaxation techniques, he is now agreeable to wait and work with PT and speak with CM prior to discharge. Currently resting comfortably in bed with call-bell in reach. Will continue to monitor

## 2016-10-25 ENCOUNTER — Encounter (HOSPITAL_COMMUNITY): Payer: Self-pay | Admitting: Orthopaedic Surgery

## 2016-10-31 ENCOUNTER — Ambulatory Visit (INDEPENDENT_AMBULATORY_CARE_PROVIDER_SITE_OTHER): Payer: Self-pay

## 2016-10-31 ENCOUNTER — Ambulatory Visit (INDEPENDENT_AMBULATORY_CARE_PROVIDER_SITE_OTHER): Payer: Self-pay | Admitting: Orthopaedic Surgery

## 2016-10-31 ENCOUNTER — Telehealth (INDEPENDENT_AMBULATORY_CARE_PROVIDER_SITE_OTHER): Payer: Self-pay | Admitting: Orthopaedic Surgery

## 2016-10-31 ENCOUNTER — Encounter (INDEPENDENT_AMBULATORY_CARE_PROVIDER_SITE_OTHER): Payer: Self-pay | Admitting: Orthopaedic Surgery

## 2016-10-31 VITALS — Ht 67.0 in | Wt 200.0 lb

## 2016-10-31 DIAGNOSIS — S93432A Sprain of tibiofibular ligament of left ankle, initial encounter: Secondary | ICD-10-CM

## 2016-10-31 DIAGNOSIS — S82852D Displaced trimalleolar fracture of left lower leg, subsequent encounter for closed fracture with routine healing: Secondary | ICD-10-CM

## 2016-10-31 DIAGNOSIS — S82852A Displaced trimalleolar fracture of left lower leg, initial encounter for closed fracture: Secondary | ICD-10-CM

## 2016-10-31 MED ORDER — OXYCODONE-ACETAMINOPHEN 5-325 MG PO TABS: 1.0000 | ORAL_TABLET | ORAL | 0 refills | Status: DC | PRN

## 2016-10-31 NOTE — Telephone Encounter (Signed)
OK 

## 2016-10-31 NOTE — Telephone Encounter (Signed)
Patient  States he is sch'd to appear in court on 11/06/16 & 11/07/16 and wants to know if Dr Ophelia CharterYates will write a letter to his Atty Rayna Sextonichard Bruce requesting a medical continences.
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
## 2016-10-31 NOTE — Telephone Encounter (Signed)
Please advise 

## 2016-11-01 ENCOUNTER — Telehealth (INDEPENDENT_AMBULATORY_CARE_PROVIDER_SITE_OTHER): Payer: Self-pay | Admitting: Orthopaedic Surgery

## 2016-11-01 NOTE — Telephone Encounter (Signed)
Pt called with fax number for medical continuation for court date to send to his lawyer. Fax [phone]. For court dates on 12/12 and 12/13

## 2016-11-01 NOTE — Telephone Encounter (Signed)
Note done. I called patient and advised he could come and pick up. He wants to call back with a fax number.

## 2016-11-05 ENCOUNTER — Telehealth (INDEPENDENT_AMBULATORY_CARE_PROVIDER_SITE_OTHER): Payer: Self-pay | Admitting: Orthopaedic Surgery

## 2016-11-05 NOTE — Telephone Encounter (Signed)
Pt calling asking for the status of the paperwork he needed to be faxed over for medical continuation, he stated he needed it by tomorrow. [phone]

## 2016-11-06 NOTE — Telephone Encounter (Signed)
Letter faxed [phone]. I called left voicemail for the patient to make him aware this was addressed and done this morning.

## 2016-11-06 NOTE — Telephone Encounter (Signed)
Letter faxed [phone]

## 2016-11-09 ENCOUNTER — Ambulatory Visit (INDEPENDENT_AMBULATORY_CARE_PROVIDER_SITE_OTHER): Payer: Self-pay | Admitting: Orthopaedic Surgery

## 2016-11-09 ENCOUNTER — Encounter (INDEPENDENT_AMBULATORY_CARE_PROVIDER_SITE_OTHER): Payer: Self-pay | Admitting: Orthopaedic Surgery

## 2016-11-09 VITALS — BP 114/76 | HR 94 | Ht 68.0 in | Wt 200.0 lb

## 2016-11-09 DIAGNOSIS — S82852A Displaced trimalleolar fracture of left lower leg, initial encounter for closed fracture: Secondary | ICD-10-CM

## 2016-11-09 NOTE — Progress Notes (Signed)
   Office Visit Note   Patient: Hunter Cook           Date of Birth: 04/04/1996           MRN: [id number] Visit Date: 11/09/2016              [facility] [address] [place] [id number] [facility]   Assessment & Plan: Visit Diagnoses:  1. Trimalleolar fracture of ankle, closed, left, initial encounter            Surgery on 10/22/16  Plan: Staples out Steri-Strips applied short leg fiberglass cast return 4 weeks for cast off x-rays and probable inability.  Follow-Up Instructions: Return in about 4 weeks (around 12/07/2016).   Orders:  No orders of the defined types were placed in this encounter.  No orders of the defined types were placed in this encounter.     Procedures: No procedures performed   Clinical Data: No additional findings.   Subjective: Chief Complaint  Patient presents with  . Left Ankle - Routine Post Op    Patient ambulates with crutches, currently in a splint, staples still intact. States doing okay.      Review of Systems unchanged from surgery   Objective: Vital Signs: BP 114/76   Pulse 94   Ht 5\' 8"  (1.727 m)   Wt 200 lb (90.7 kg)   BMI 30.41 kg/m   Physical Exam incisions look good still mild swelling of the foot  Ortho Exam  Specialty Comments:  No specialty comments available.  Imaging: No results found.   PMFS History: Patient Active Problem List   Diagnosis Date Noted  . Closed left ankle fracture 10/22/2016  . Ankle syndesmosis disruption, left, initial encounter 10/22/2016  . Trimalleolar fracture of ankle, closed, left, initial encounter 10/17/2016   Past Medical History:  Diagnosis Date  . Bipolar 1 disorder (HCC)     Family History  Problem Relation Age of Onset  . CAD Father   . Heart attack Father     Past Surgical History:  Procedure Laterality Date  . FRACTURE SURGERY    . INCISION AND DRAINAGE Right 2012   snake bite to hand;  had ~ 6 ORs related to this  . ORIF ANKLE FRACTURE Left 10/22/2016   Procedure: OPEN REDUCTION INTERNAL FIXATION (ORIF) BIMALLEOLAR ANKLE FRACTURE;  Surgeon: Eldred MangesMark C Huda Petrey, MD;  Location: [facility] OR;  Service: Orthopedics;  Laterality: Left;  . ORIF ANKLE FRACTURE BIMALLEOLAR Left 10/22/2016  . SKIN GRAFT  2012   "related to snake bite"   Social History   Occupational History  . Not on file.   Social History Main Topics  . Smoking status: Current Every Day Smoker    Packs/day: 1.00    Years: 8.00    Types: Cigarettes  . Smokeless tobacco: Current User    Types: Chew     Comment: 10/22/2016 "chew q once in a while"  . Alcohol use Yes     Comment: 10/22/2016 "quit in October 2017"  . Drug use:     Types: Marijuana     Comment: 10/22/2016 "qd"  . Sexual activity: Yes

## 2016-11-13 ENCOUNTER — Telehealth (INDEPENDENT_AMBULATORY_CARE_PROVIDER_SITE_OTHER): Payer: Self-pay | Admitting: *Deleted
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
## 2016-11-13 NOTE — Telephone Encounter (Signed)
Can you send these? Thanks.

## 2016-11-13 NOTE — Telephone Encounter (Signed)
[employer] adjuster called for op report and office notes faxed. FAX [phone]

## 2016-11-25 ENCOUNTER — Emergency Department
Admission: EM | Admit: 2016-11-25 | Discharge: 2016-11-25 | Disposition: A | Discharge status: Home / Self Care | Payer: BLUE CROSS/BLUE SHIELD | Attending: Emergency Medicine | Admitting: Emergency Medicine

## 2016-11-25 ENCOUNTER — Encounter: Payer: Self-pay | Admitting: Emergency Medicine

## 2016-11-25 DIAGNOSIS — F17220 Nicotine dependence, chewing tobacco, uncomplicated: Secondary | ICD-10-CM | POA: Insufficient documentation

## 2016-11-25 DIAGNOSIS — Z791 Long term (current) use of non-steroidal anti-inflammatories (NSAID): Secondary | ICD-10-CM | POA: Insufficient documentation

## 2016-11-25 DIAGNOSIS — F1490 Cocaine use, unspecified, uncomplicated: Secondary | ICD-10-CM | POA: Insufficient documentation

## 2016-11-25 DIAGNOSIS — F4320 Adjustment disorder, unspecified: Secondary | ICD-10-CM

## 2016-11-25 DIAGNOSIS — Z5181 Encounter for therapeutic drug level monitoring: Secondary | ICD-10-CM | POA: Insufficient documentation

## 2016-11-25 DIAGNOSIS — F17210 Nicotine dependence, cigarettes, uncomplicated: Secondary | ICD-10-CM | POA: Insufficient documentation

## 2016-11-25 DIAGNOSIS — Z7982 Long term (current) use of aspirin: Secondary | ICD-10-CM | POA: Insufficient documentation

## 2016-11-25 LAB — CBC
HEMATOCRIT: 44.3 % (ref 40.0–52.0)
HEMOGLOBIN: 15.4 g/dL (ref 13.0–18.0)
MCH: 30.5 pg (ref 26.0–34.0)
MCHC: 34.7 g/dL (ref 32.0–36.0)
MCV: 87.9 fL (ref 80.0–100.0)
Platelets: 276 10*3/uL (ref 150–440)
RBC: 5.04 MIL/uL (ref 4.40–5.90)
RDW: 13.3 % (ref 11.5–14.5)
WBC: 10.2 10*3/uL (ref 3.8–10.6)

## 2016-11-25 LAB — COMPREHENSIVE METABOLIC PANEL
ALT: 14 U/L — ABNORMAL LOW (ref 17–63)
ANION GAP: 12 (ref 5–15)
AST: 22 U/L (ref 15–41)
Albumin: 4.7 g/dL (ref 3.5–5.0)
Alkaline Phosphatase: 65 U/L (ref 38–126)
BILIRUBIN TOTAL: 0.9 mg/dL (ref 0.3–1.2)
BUN: 10 mg/dL (ref 6–20)
CO2: 26 mmol/L (ref 22–32)
Calcium: 9.8 mg/dL (ref 8.9–10.3)
Chloride: 103 mmol/L (ref 101–111)
Creatinine, Ser: 0.94 mg/dL (ref 0.61–1.24)
Glucose, Bld: 96 mg/dL (ref 65–99)
POTASSIUM: 4 mmol/L (ref 3.5–5.1)
Sodium: 141 mmol/L (ref 135–145)
TOTAL PROTEIN: 8 g/dL (ref 6.5–8.1)

## 2016-11-25 LAB — URINE DRUG SCREEN, QUALITATIVE (ARMC ONLY)
Amphetamines, Ur Screen: NOT DETECTED
BARBITURATES, UR SCREEN: NOT DETECTED
Benzodiazepine, Ur Scrn: NOT DETECTED
CANNABINOID 50 NG, UR ~~LOC~~: POSITIVE — AB
COCAINE METABOLITE, UR ~~LOC~~: POSITIVE — AB
MDMA (ECSTASY) UR SCREEN: NOT DETECTED
METHADONE SCREEN, URINE: NOT DETECTED
OPIATE, UR SCREEN: NOT DETECTED
Phencyclidine (PCP) Ur S: NOT DETECTED
TRICYCLIC, UR SCREEN: NOT DETECTED

## 2016-11-25 LAB — ETHANOL: ALCOHOL ETHYL (B): 15 mg/dL — AB (ref <5)

## 2016-11-25 LAB — SALICYLATE LEVEL: Salicylate Lvl: <7 mg/dL (ref 2.8–30.0)

## 2016-11-25 LAB — ACETAMINOPHEN LEVEL: Acetaminophen (Tylenol), Serum: <10 ug/mL — ABNORMAL LOW (ref 10–30)

## 2016-11-25 NOTE — ED Notes (Signed)
IVC papers rescinded. Pt given clothes and cell phone. Waiting discharge paperwork.

## 2016-11-25 NOTE — ED Provider Notes (Signed)
[facility] Emergency Department Provider Note   ____________________________________________   First MD Initiated Contact with Patient 11/25/16 1603     (approximate)  I have reviewed the triage vital signs and the nursing notes.   HISTORY  Chief Complaint Psychiatric Evaluation    HPI Hunter Cook is a 20 y.o. male history of bipolar disorder. Brought in on involuntary commitment for reportedly text and suicidal statements to girlfriend.  Patient tells me that he is not suicidal, states that he was getting childish and wanted to upset his girlfriend whom he is been having problems with and therefore texture that he was going to kill himself and slit his wrists, and reports that he did this as he was upset with her and did not have any intent to harm himself.  Denies any thoughts of suicide, hallucinations, or wanting to harm anyone.     Past Medical History:  Diagnosis Date  . Bipolar 1 disorder [facility](HCC)     Patient Active Problem List   Diagnosis Date Noted  . Closed left ankle fracture 10/22/2016  . Ankle syndesmosis disruption, left, initial encounter 10/22/2016  . Trimalleolar fracture of ankle, closed, left, initial encounter 10/17/2016    Past Surgical History:  Procedure Laterality Date  . FRACTURE SURGERY    . INCISION AND DRAINAGE Right 2012   snake bite to hand; had ~ 6 ORs related to this  . ORIF ANKLE FRACTURE Left 10/22/2016   Procedure: OPEN REDUCTION INTERNAL FIXATION (ORIF) BIMALLEOLAR ANKLE FRACTURE;  Surgeon: Eldred MangesMark C Yates, MD;  Location: [facility] OR;  Service: Orthopedics;  Laterality: Left;  . ORIF ANKLE FRACTURE BIMALLEOLAR Left 10/22/2016  . SKIN GRAFT  2012   "related to snake bite"    Prior to Admission medications   Medication Sig Start Date End Date Taking? Authorizing Provider  aspirin (BAYER ASPIRIN) 325 MG tablet Take 1 tablet (325 mg total) by mouth daily. 10/23/16   Eldred MangesMark C Yates, MD  divalproex (DEPAKOTE ER) 500  MG 24 hr tablet Take 500 mg by mouth daily.  09/25/16   Historical Provider, MD  ibuprofen (ADVIL,MOTRIN) 600 MG tablet Take 1 tablet (600 mg total) by mouth every 6 (six) hours as needed. 10/16/16   Tatyana Kirichenko, PA-C  oxyCODONE-acetaminophen (ROXICET) 5-325 MG tablet Take 1-2 tablets by mouth every 4 (four) hours as needed for severe pain. 10/31/16   Eldred MangesMark C Yates, MD    Allergies No known allergies  Family History  Problem Relation Age of Onset  . CAD Father   . Heart attack Father     Social History Social History  Substance Use Topics  . Smoking status: Current Every Day Smoker    Packs/day: 1.00    Years: 8.00    Types: Cigarettes  . Smokeless tobacco: Current User    Types: Chew     Comment: 10/22/2016 "chew q once in a while"  . Alcohol use Yes     Comment: 10/22/2016 "quit in October 2017"    Review of Systems Constitutional: No fever/chills Eyes: No visual changes. ENT: No sore throat. Cardiovascular: Denies chest pain. Respiratory: Denies shortness of breath. Gastrointestinal: No abdominal pain.  No nausea, no vomiting.  No diarrhea.  No constipation. Genitourinary: Negative for dysuria. Musculoskeletal: Negative for back pain. Skin: Negative for rash. Neurological: Negative for headaches, focal weakness or numbness.  10-point ROS otherwise negative.  ____________________________________________   PHYSICAL EXAM:  VITAL SIGNS: ED Triage Vitals  Enc Vitals Group  BP 11/25/16 1409 (!) 146/97     Pulse Rate 11/25/16 1409 (!) 118     Resp 11/25/16 1409 16     Temp 11/25/16 1409 98 F (36.7 C)     Temp Source 11/25/16 1409 Oral     SpO2 11/25/16 1409 97 %     Weight 11/25/16 1410 200 lb (90.7 kg)     Height 11/25/16 1410 5\' 7"  (1.702 m)     Head Circumference --      Peak Flow --      Pain Score 11/25/16 1435 8     Pain Loc --      Pain Edu? --      Excl. in GC? --     Constitutional: Alert and oriented. Well appearing and in no acute  distress. Eyes: Conjunctivae are normal. PERRL. EOMI. Head: Atraumatic. Nose: No congestion/rhinnorhea. Mouth/Throat: Mucous membranes are moist.  Oropharynx non-erythematous. Neck: No stridor.   Cardiovascular: Normal rate, regular rhythm. Grossly normal heart sounds.  Good peripheral circulation. Respiratory: Normal respiratory effort.  No retractions. Lungs CTAB. Gastrointestinal: Soft and nontender. No distention. No abdominal bruits. No CVA tenderness. Musculoskeletal: No lower extremity tenderness nor edemaWith normal use except left lower leg is in a cast, toes able to wiggle well with normal capillary refill.  No joint effusions. Neurologic:  Normal speech and language. No gross focal neurologic deficits are appreciated. No gait instability. Skin:  Skin is warm, dry and intact. No rash noted. Psychiatric: Mood and affect are normal. Speech and behavior are normal. Denies suicidal thoughts or harm himself.  ____________________________________________   LABS (all labs ordered are listed, but only abnormal results are displayed)  Labs Reviewed  COMPREHENSIVE METABOLIC PANEL - Abnormal; Notable for the following:       Result Value   ALT 14 (*)    All other components within normal limits  ETHANOL - Abnormal; Notable for the following:    Alcohol, Ethyl (B) 15 (*)    All other components within normal limits  ACETAMINOPHEN LEVEL - Abnormal; Notable for the following:    Acetaminophen (Tylenol), Serum <10 (*)    All other components within normal limits  URINE DRUG SCREEN, QUALITATIVE ([facility] ONLY) - Abnormal; Notable for the following:    Cocaine Metabolite,Ur [place] POSITIVE (*)    Cannabinoid 50 Ng, Ur [place] POSITIVE (*)    All other components within normal limits  SALICYLATE LEVEL  CBC    ____________________________________________  EKG   ____________________________________________  RADIOLOGY   ____________________________________________   PROCEDURES  Procedure(s) performed: None  Procedures  Critical Care performed: No  ____________________________________________   INITIAL IMPRESSION / ASSESSMENT AND PLAN / ED COURSE  Pertinent labs & imaging results that were available during my care of the patient were reviewed by me and considered in my medical decision making (see chart for details).  Patient normal hemodynamics, no complaint of any acute medical illness, denies any overdose or ingestion. Patient presently denying any suicidal ideation or intent, but reports this was acting out to anger upset his girlfriend potentially. Despite this, I will continue him on involuntary commitment and consult specialist from psychiatry team for further evaluation  ------  Patient seen by psychiatrist, may recommend discharge. I agree, patient denies any ideation to harm himself, anyone else and has been pleasant and compliant throughout his ED stay. Recommend he follow up with [facility] in the outpatient setting and he is agreeable  Clinical Course      ____________________________________________   FINAL  CLINICAL IMPRESSION(S) / ED DIAGNOSES  Final diagnoses:  Adjustment disorder, unspecified type  Cocaine use      NEW MEDICATIONS STARTED DURING THIS VISIT:  Discharge Medication List as of 11/25/2016  5:48 PM       Note:  This document was prepared using Dragon voice recognition software and may include unintentional dictation errors.     Sharyn CreamerMark Quale, MD 11/26/16 0100

## 2016-11-25 NOTE — ED Notes (Signed)
FIRST NURSE NOTE: Pt arrived with [employer] after they received reports of the patient wanting to hurt himself.

## 2016-11-25 NOTE — ED Notes (Signed)
Inventory: shorts, shoes, hat, shirt  iphone (sent with security)

## 2016-11-25 NOTE — ED Notes (Signed)
Pt given meal and drink ? ?

## 2016-11-25 NOTE — Discharge Instructions (Signed)
You have been seen in the Emergency Department (ED)  today for psychiatric issues.  You have been evaluated by the behavioral medicine specialists and are being referred to: ° °[facility] °[address] °[place], [place] [address] °Phone:  [phone] or [phone] ° °Open Access:   °Walk-in ASSESSMENT hours, M-W-F, 8:00am - 3:00pm °Advanced Acess CRISIS:  M-F, 8:00am - 8:00pm °Outpatient Services Office Hours:  M-F, 8:00am - 5:00pm ° °Please return to the Emergency Department (ED)  immediately if you have ANY thoughts of hurting yourself or anyone else, so that we may help you. ° °Please avoid alcohol and drug use. ° °Follow up with your doctor and/or therapist as soon as possible regarding today's ED  visit.   Please follow up any other recommendations and clinic appointments provided by the psychiatry team that saw you in the Emergency Department. ° °
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
## 2016-11-25 NOTE — ED Notes (Signed)
Key # [id number] in ED main pyxis.

## 2016-11-30 ENCOUNTER — Telehealth (INDEPENDENT_AMBULATORY_CARE_PROVIDER_SITE_OTHER): Payer: Self-pay | Admitting: *Deleted

## 2016-11-30 NOTE — Telephone Encounter (Signed)
Yes. Rx called into pharmacy.

## 2016-11-30 NOTE — Telephone Encounter (Signed)
OK for ultram # 15 .   One po bid prn pain. No refills
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
## 2016-11-30 NOTE — Telephone Encounter (Signed)
Did you call for patient?

## 2016-11-30 NOTE — Telephone Encounter (Signed)
Pt called for hydrocodone 5mg  refill

## 2016-11-30 NOTE — Telephone Encounter (Signed)
Please advise 

## 2016-12-07 ENCOUNTER — Ambulatory Visit (INDEPENDENT_AMBULATORY_CARE_PROVIDER_SITE_OTHER): Payer: Self-pay | Admitting: Orthopaedic Surgery

## 2016-12-07 ENCOUNTER — Ambulatory Visit (INDEPENDENT_AMBULATORY_CARE_PROVIDER_SITE_OTHER): Payer: Self-pay

## 2016-12-07 ENCOUNTER — Encounter (INDEPENDENT_AMBULATORY_CARE_PROVIDER_SITE_OTHER): Payer: Self-pay | Admitting: Orthopaedic Surgery

## 2016-12-07 VITALS — BP 121/80 | HR 65 | Ht 68.0 in | Wt 200.0 lb

## 2016-12-07 DIAGNOSIS — S82852D Displaced trimalleolar fracture of left lower leg, subsequent encounter for closed fracture with routine healing: Secondary | ICD-10-CM
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
## 2017-01-01 ENCOUNTER — Encounter (INDEPENDENT_AMBULATORY_CARE_PROVIDER_SITE_OTHER): Payer: Self-pay | Admitting: Orthopaedic Surgery

## 2017-01-01 ENCOUNTER — Ambulatory Visit (INDEPENDENT_AMBULATORY_CARE_PROVIDER_SITE_OTHER): Payer: No Typology Code available for payment source | Admitting: Orthopaedic Surgery

## 2017-01-01 VITALS — BP 148/90 | HR 79 | Ht 68.0 in | Wt 200.0 lb

## 2017-01-01 DIAGNOSIS — S82852A Displaced trimalleolar fracture of left lower leg, initial encounter for closed fracture: Secondary | ICD-10-CM

## 2017-01-01 NOTE — Progress Notes (Signed)
   Post-Op Visit Note   Patient: Hunter SereneCody L Dobratz           Date of Birth: 05/10/1996           MRN: [id number] Visit Date: 01/01/2017 PCP: No PCP Per Patient   Assessment & Plan:  Chief Complaint:  Chief Complaint  Patient presents with  . Left Ankle - Routine Post Op   Visit Diagnoses:Bimalleolar ankle fracture with syndesmosis ORIF.  Plan: We'll schedule him for a syndesmotic screw removal he states he cannot tolerated having done in the office and got very anxious only discussed this.  Follow-Up Instructions: Patient be scheduled for syndesmotic removal as an outpatient with some IV sedation Versed and fentanyl as well as local anesthesia mini C-arm use and use of a Zimmer small fragment screwdriver  Orders:  No orders of the defined types were placed in this encounter.  No orders of the defined types were placed in this encounter.  HPI Patient returns for wound check. He is status post ORIF Bimalleolar Ankle Fracture on Left on 10/22/16.  He is 10 weeks 1 day post op. He complains of continued pain and swelling in his left ankle. He is nonweightbearing in the CAM boot and on crutches. He states that the pain is worse if trying to apply weight. He is not taking anything for pain because he doesn't have anything.  Imaging: No results found.  PMFS History: Patient Active Problem List   Diagnosis Date Noted  . Closed left ankle fracture 10/22/2016  . Ankle syndesmosis disruption, left, initial encounter 10/22/2016  . Trimalleolar fracture of ankle, closed, left, initial encounter 10/17/2016   Past Medical History:  Diagnosis Date  . Bipolar 1 disorder (HCC)     Family History  Problem Relation Age of Onset  . CAD Father   . Heart attack Father     Past Surgical History:  Procedure Laterality Date  . FRACTURE SURGERY    . INCISION AND DRAINAGE Right 2012   snake bite to hand; had ~ 6 ORs related to this  . ORIF ANKLE FRACTURE Left 10/22/2016   Procedure: OPEN  REDUCTION INTERNAL FIXATION (ORIF) BIMALLEOLAR ANKLE FRACTURE;  Surgeon: Eldred MangesMark C Demarie Hyneman, MD;  Location: [facility] OR;  Service: Orthopedics;  Laterality: Left;  . ORIF ANKLE FRACTURE BIMALLEOLAR Left 10/22/2016  . SKIN GRAFT  2012   "related to snake bite"   Social History   Occupational History  . Not on file.   Social History Main Topics  . Smoking status: Current Every Day Smoker    Packs/day: 1.00    Years: 8.00    Types: Cigarettes  . Smokeless tobacco: Current User    Types: Chew     Comment: 10/22/2016 "chew q once in a while"  . Alcohol use Yes     Comment: 10/22/2016 "quit in October 2017"  . Drug use: Yes    Types: Marijuana     Comment: 10/22/2016 "qd"  . Sexual activity: Yes

## 2017-01-11 ENCOUNTER — Encounter (HOSPITAL_COMMUNITY): Payer: Self-pay | Admitting: *Deleted

## 2017-01-14 ENCOUNTER — Encounter (HOSPITAL_COMMUNITY)
Admission: RE | Disposition: A | Discharge status: Home / Self Care | Payer: Self-pay | Source: Ambulatory Visit | Attending: Orthopaedic Surgery

## 2017-01-14 ENCOUNTER — Ambulatory Visit (HOSPITAL_COMMUNITY): Payer: Self-pay | Admitting: Anesthesiology

## 2017-01-14 ENCOUNTER — Ambulatory Visit (HOSPITAL_COMMUNITY)
Admission: RE | Admit: 2017-01-14 | Discharge: 2017-01-14 | Disposition: A | Discharge status: Home / Self Care | Payer: Self-pay | Source: Ambulatory Visit | Attending: Orthopaedic Surgery | Admitting: Orthopaedic Surgery

## 2017-01-14 ENCOUNTER — Encounter (HOSPITAL_COMMUNITY): Payer: Self-pay | Admitting: Anesthesiology

## 2017-01-14 DIAGNOSIS — F319 Bipolar disorder, unspecified: Secondary | ICD-10-CM | POA: Insufficient documentation

## 2017-01-14 DIAGNOSIS — F17210 Nicotine dependence, cigarettes, uncomplicated: Secondary | ICD-10-CM | POA: Insufficient documentation

## 2017-01-14 DIAGNOSIS — Z472 Encounter for removal of internal fixation device: Secondary | ICD-10-CM | POA: Insufficient documentation

## 2017-01-14 DIAGNOSIS — Z8249 Family history of ischemic heart disease and other diseases of the circulatory system: Secondary | ICD-10-CM | POA: Insufficient documentation

## 2017-01-14 DIAGNOSIS — F1290 Cannabis use, unspecified, uncomplicated: Secondary | ICD-10-CM | POA: Insufficient documentation

## 2017-01-14 DIAGNOSIS — S93432A Sprain of tibiofibular ligament of left ankle, initial encounter: Secondary | ICD-10-CM | POA: Diagnosis present

## 2017-01-14 HISTORY — PX: HARDWARE REMOVAL: SHX979

## 2017-01-14 LAB — COMPREHENSIVE METABOLIC PANEL
ALT: 20 U/L (ref 17–63)
AST: 21 U/L (ref 15–41)
Albumin: 4.1 g/dL (ref 3.5–5.0)
Alkaline Phosphatase: 63 U/L (ref 38–126)
Anion gap: 11 (ref 5–15)
BUN: 15 mg/dL (ref 6–20)
CHLORIDE: 106 mmol/L (ref 101–111)
CO2: 23 mmol/L (ref 22–32)
Calcium: 9.5 mg/dL (ref 8.9–10.3)
Creatinine, Ser: 0.9 mg/dL (ref 0.61–1.24)
GFR calc non Af Amer: >60 mL/min (ref >60)
Glucose, Bld: 82 mg/dL (ref 65–99)
Potassium: 4.1 mmol/L (ref 3.5–5.1)
SODIUM: 140 mmol/L (ref 135–145)
Total Bilirubin: 0.5 mg/dL (ref 0.3–1.2)
Total Protein: 6.9 g/dL (ref 6.5–8.1)

## 2017-01-14 LAB — CBC
HEMATOCRIT: 48 % (ref 39.0–52.0)
Hemoglobin: 16.3 g/dL (ref 13.0–17.0)
MCH: 30.8 pg (ref 26.0–34.0)
MCHC: 34 g/dL (ref 30.0–36.0)
MCV: 90.6 fL (ref 78.0–100.0)
Platelets: 266 10*3/uL (ref 150–400)
RBC: 5.3 MIL/uL (ref 4.22–5.81)
RDW: 13.9 % (ref 11.5–15.5)
WBC: 9.4 10*3/uL (ref 4.0–10.5)

## 2017-01-14 SURGERY — REMOVAL, HARDWARE
Anesthesia: Monitor Anesthesia Care | Site: Foot | Laterality: Left

## 2017-01-14 MED ORDER — CHLORHEXIDINE GLUCONATE 4 % EX LIQD: 60.0000 mL | Freq: Once | CUTANEOUS | Status: DC

## 2017-01-14 MED ORDER — MEPERIDINE HCL 25 MG/ML IJ SOLN
6.2500 mg | INTRAMUSCULAR | Status: DC | PRN
  Administered 2017-01-14: 12.5 mg via INTRAVENOUS

## 2017-01-14 MED ORDER — MIDAZOLAM HCL 2 MG/2ML IJ SOLN
INTRAMUSCULAR | Status: AC
  Filled 2017-01-14: qty 2

## 2017-01-14 MED ORDER — PROPOFOL 10 MG/ML IV BOLUS
INTRAVENOUS | Status: AC
  Filled 2017-01-14: qty 20

## 2017-01-14 MED ORDER — HYDROMORPHONE HCL 1 MG/ML IJ SOLN
0.2500 mg | INTRAMUSCULAR | Status: DC | PRN
  Administered 2017-01-14 (×2): 0.5 mg via INTRAVENOUS

## 2017-01-14 MED ORDER — FENTANYL CITRATE (PF) 100 MCG/2ML IJ SOLN
INTRAMUSCULAR | Status: AC
  Filled 2017-01-14: qty 2

## 2017-01-14 MED ORDER — MIDAZOLAM HCL 5 MG/5ML IJ SOLN
INTRAMUSCULAR | Status: DC | PRN
  Administered 2017-01-14: 2 mg via INTRAVENOUS

## 2017-01-14 MED ORDER — PROPOFOL 10 MG/ML IV BOLUS
INTRAVENOUS | Status: DC | PRN
  Administered 2017-01-14 (×2): 50 mg via INTRAVENOUS

## 2017-01-14 MED ORDER — MIDAZOLAM HCL 2 MG/2ML IJ SOLN
0.5000 mg | Freq: Once | INTRAMUSCULAR | Status: AC | PRN
  Administered 2017-01-14: 1 mg via INTRAVENOUS

## 2017-01-14 MED ORDER — BUPIVACAINE HCL (PF) 0.25 % IJ SOLN
INTRAMUSCULAR | Status: AC
  Filled 2017-01-14: qty 30

## 2017-01-14 MED ORDER — PROPOFOL 500 MG/50ML IV EMUL
INTRAVENOUS | Status: DC | PRN
  Administered 2017-01-14: 300 ug/kg/min via INTRAVENOUS

## 2017-01-14 MED ORDER — LIDOCAINE 2% (20 MG/ML) 5 ML SYRINGE
INTRAMUSCULAR | Status: AC
  Filled 2017-01-14: qty 5

## 2017-01-14 MED ORDER — OXYCODONE-ACETAMINOPHEN 5-325 MG PO TABS: 1.0000 | ORAL_TABLET | Freq: Three times a day (TID) | ORAL | 0 refills | Status: DC | PRN

## 2017-01-14 MED ORDER — PROMETHAZINE HCL 25 MG/ML IJ SOLN: 6.2500 mg | INTRAMUSCULAR | Status: DC | PRN

## 2017-01-14 MED ORDER — BUPIVACAINE HCL (PF) 0.25 % IJ SOLN
INTRAMUSCULAR | Status: DC | PRN
  Administered 2017-01-14: 2 mL

## 2017-01-14 MED ORDER — MIDAZOLAM HCL 2 MG/2ML IJ SOLN
INTRAMUSCULAR | Status: AC
  Administered 2017-01-14: 1 mg via INTRAVENOUS
  Filled 2017-01-14: qty 2

## 2017-01-14 MED ORDER — FENTANYL CITRATE (PF) 100 MCG/2ML IJ SOLN
INTRAMUSCULAR | Status: DC | PRN
  Administered 2017-01-14 (×4): 50 ug via INTRAVENOUS

## 2017-01-14 MED ORDER — LACTATED RINGERS IV SOLN
INTRAVENOUS | Status: DC
  Administered 2017-01-14: 14:00:00 via INTRAVENOUS

## 2017-01-14 MED ORDER — HYDROMORPHONE HCL 1 MG/ML IJ SOLN
INTRAMUSCULAR | Status: AC
  Administered 2017-01-14: 0.5 mg via INTRAVENOUS
  Filled 2017-01-14: qty 1

## 2017-01-14 MED ORDER — MEPERIDINE HCL 25 MG/ML IJ SOLN
INTRAMUSCULAR | Status: AC
  Administered 2017-01-14: 12.5 mg via INTRAVENOUS
  Filled 2017-01-14: qty 1

## 2017-01-14 MED ORDER — CEFAZOLIN IN D5W 1 GM/50ML IV SOLN
1.0000 g | INTRAVENOUS | Status: AC
  Administered 2017-01-14: 2 g via INTRAVENOUS
  Filled 2017-01-14: qty 50

## 2017-01-14 MED ORDER — OXYCODONE-ACETAMINOPHEN 5-325 MG PO TABS
ORAL_TABLET | ORAL | Status: AC
  Administered 2017-01-14: 2
  Filled 2017-01-14: qty 2

## 2017-01-14 MED ORDER — PROPOFOL 1000 MG/100ML IV EMUL
INTRAVENOUS | Status: AC
  Filled 2017-01-14: qty 100

## 2017-01-14 SURGICAL SUPPLY — 46 items
BANDAGE ACE 4X5 VEL STRL LF (GAUZE/BANDAGES/DRESSINGS) IMPLANT
BANDAGE ACE 6X5 VEL STRL LF (GAUZE/BANDAGES/DRESSINGS) ×3 IMPLANT
BANDAGE ESMARK 6X9 LF (GAUZE/BANDAGES/DRESSINGS) IMPLANT
BNDG COHESIVE 4X5 TAN STRL (GAUZE/BANDAGES/DRESSINGS) IMPLANT
BNDG ESMARK 6X9 LF (GAUZE/BANDAGES/DRESSINGS)
BNDG GAUZE ELAST 4 BULKY (GAUZE/BANDAGES/DRESSINGS) IMPLANT
COVER SURGICAL LIGHT HANDLE (MISCELLANEOUS) ×3 IMPLANT
DRAPE C-ARM 42X72 X-RAY (DRAPES) IMPLANT
DRAPE C-ARM MINI 42X72 WSTRAPS (DRAPES) ×3 IMPLANT
DRAPE HALF SHEET 40X57 (DRAPES) IMPLANT
DRAPE INCISE IOBAN 66X45 STRL (DRAPES) IMPLANT
DRAPE ORTHO SPLIT 77X108 STRL (DRAPES)
DRAPE PROXIMA HALF (DRAPES) IMPLANT
DRAPE SURG ORHT 6 SPLT 77X108 (DRAPES) IMPLANT
DRSG EMULSION OIL 3X3 NADH (GAUZE/BANDAGES/DRESSINGS) IMPLANT
DRSG PAD ABDOMINAL 8X10 ST (GAUZE/BANDAGES/DRESSINGS) IMPLANT
ELECT REM PT RETURN 9FT ADLT (ELECTROSURGICAL)
ELECTRODE REM PT RTRN 9FT ADLT (ELECTROSURGICAL) IMPLANT
GAUZE SPONGE 4X4 12PLY STRL (GAUZE/BANDAGES/DRESSINGS) ×3 IMPLANT
GLOVE BIOGEL PI IND STRL 8 (GLOVE) ×2 IMPLANT
GLOVE BIOGEL PI INDICATOR 8 (GLOVE) ×4
GLOVE ORTHO TXT STRL SZ7.5 (GLOVE) ×6 IMPLANT
GOWN STRL REUS W/ TWL LRG LVL3 (GOWN DISPOSABLE) ×1 IMPLANT
GOWN STRL REUS W/ TWL XL LVL3 (GOWN DISPOSABLE) ×1 IMPLANT
GOWN STRL REUS W/TWL 2XL LVL3 (GOWN DISPOSABLE) ×3 IMPLANT
GOWN STRL REUS W/TWL LRG LVL3 (GOWN DISPOSABLE) ×2
GOWN STRL REUS W/TWL XL LVL3 (GOWN DISPOSABLE) ×2
KIT BASIN OR (CUSTOM PROCEDURE TRAY) ×3 IMPLANT
KIT ROOM TURNOVER OR (KITS) ×3 IMPLANT
MANIFOLD NEPTUNE II (INSTRUMENTS) IMPLANT
NS IRRIG 1000ML POUR BTL (IV SOLUTION) ×3 IMPLANT
PACK GENERAL/GYN (CUSTOM PROCEDURE TRAY) IMPLANT
PACK ORTHO EXTREMITY (CUSTOM PROCEDURE TRAY) ×3 IMPLANT
PAD ARMBOARD 7.5X6 YLW CONV (MISCELLANEOUS) ×6 IMPLANT
PAD CAST 4YDX4 CTTN HI CHSV (CAST SUPPLIES) IMPLANT
PADDING CAST COTTON 4X4 STRL (CAST SUPPLIES)
STAPLER VISISTAT 35W (STAPLE) IMPLANT
STOCKINETTE IMPERVIOUS 9X36 MD (GAUZE/BANDAGES/DRESSINGS) IMPLANT
SUT ETHILON 4 0 FS 1 (SUTURE) ×6 IMPLANT
SUT VIC AB 0 CT1 27 (SUTURE)
SUT VIC AB 0 CT1 27XBRD ANBCTR (SUTURE) IMPLANT
SUT VIC AB 2-0 CT1 27 (SUTURE)
SUT VIC AB 2-0 CT1 TAPERPNT 27 (SUTURE) IMPLANT
TOWEL OR 17X24 6PK STRL BLUE (TOWEL DISPOSABLE) IMPLANT
TOWEL OR 17X26 10 PK STRL BLUE (TOWEL DISPOSABLE) IMPLANT
WATER STERILE IRR 1000ML POUR (IV SOLUTION) IMPLANT

## 2017-01-14 NOTE — Brief Op Note (Signed)
01/14/2017  2:57 PM  PATIENT:  Vicente Sereneody L Dimaio  21 y.o. male  PRE-OPERATIVE DIAGNOSIS:  Retained Hardware Left Ankle  POST-OPERATIVE DIAGNOSIS:  Retained Hardware Left Ankle  PROCEDURE:  Procedure(s): Left Ankle Syndesmotic Screw Removal (Left)  SURGEON:  Surgeon(s) and Role:    * Eldred MangesMark C Yates, MD - Primary  PHYSICIAN ASSISTANT:     ANESTHESIA:   general  EBL:  Total I/O In: 700 [I.V.:700] Out: -   BLOOD ADMINISTERED:none  DRAINS: none   LOCAL MEDICATIONS USED:  Marcaine  SPECIMEN:  No Specimen  DISPOSITION OF SPECIMEN:  N/A  COUNTS:  YES  TOURNIQUET:  * No tourniquets in log *  DICTATION: .Dragon Dictation  PLAN OF CARE: Discharge to home after PACU  PATIENT DISPOSITION:  PACU - hemodynamically stable.

## 2017-01-14 NOTE — Anesthesia Preprocedure Evaluation (Addendum)
Anesthesia Evaluation  Patient identified by MRN, date of birth, ID band Patient awake    Reviewed: Allergy & Precautions, NPO status , Patient's Chart, lab work & pertinent test results  History of Anesthesia Complications Negative for: history of anesthetic complications  Airway Mallampati: II  TM Distance: >3 FB Neck ROM: Full    Dental  (+) Poor Dentition, Chipped, Caps, Dental Advisory Given, Loose   Pulmonary Current Smoker,    breath sounds clear to auscultation       Cardiovascular negative cardio ROS   Rhythm:Regular Rate:Normal     Neuro/Psych Bipolar Disorder negative neurological ROS     GI/Hepatic negative GI ROS, (+)     substance abuse  marijuana use,   Endo/Other  negative endocrine ROSMorbid obesity  Renal/GU      Musculoskeletal   Abdominal (+) + obese,   Peds  Hematology negative hematology ROS (+)   Anesthesia Other Findings   Reproductive/Obstetrics                            Anesthesia Physical Anesthesia Plan  ASA: II  Anesthesia Plan: MAC   Post-op Pain Management:    Induction:   Airway Management Planned: Natural Airway and Nasal Cannula  Additional Equipment:   Intra-op Plan:   Post-operative Plan:   Informed Consent: I have reviewed the patients History and Physical, chart, labs and discussed the procedure including the risks, benefits and alternatives for the proposed anesthesia with the patient or authorized representative who has indicated his/her understanding and acceptance.   Dental advisory given  Plan Discussed with: CRNA and Surgeon  Anesthesia Plan Comments: (Plan routine monitors, MAC)        Anesthesia Quick Evaluation

## 2017-01-14 NOTE — Anesthesia Postprocedure Evaluation (Addendum)
Anesthesia Post Note  Patient: Hunter Cook  Procedure(s) Performed: Procedure(s) (LRB): Left Ankle Syndesmotic Screw Removal (Left)  Patient location during evaluation: PACU Anesthesia Type: MAC Level of consciousness: awake and alert, oriented and patient cooperative Pain management: pain level controlled Vital Signs Assessment: post-procedure vital signs reviewed and stable Respiratory status: spontaneous breathing, nonlabored ventilation and respiratory function stable Cardiovascular status: blood pressure returned to baseline and stable Postop Assessment: no signs of nausea or vomiting Anesthetic complications: no       Last Vitals:  Vitals:   01/14/17 1515 01/14/17 1530  BP: 114/80 125/80  Pulse: 74 81  Resp: 19 15  Temp:  36.7 C    Last Pain:  Vitals:   01/14/17 1545  TempSrc:   PainSc: 4                  Jasher Barkan,E. Jaelyn Bourgoin

## 2017-01-14 NOTE — Anesthesia Procedure Notes (Signed)
Procedure Name: MAC Date/Time: 01/14/2017 2:32 PM Performed by: Lance Coon Pre-anesthesia Checklist: Patient identified, Emergency Drugs available, Suction available, Patient being monitored and Timeout performed Patient Re-evaluated:Patient Re-evaluated prior to inductionOxygen Delivery Method: Nasal cannula

## 2017-01-14 NOTE — Interval H&P Note (Signed)
History and Physical Interval Note:  01/14/2017 1:52 PM  Hunter Cook  has presented today for surgery, with the diagnosis of Retained Hardware Left Ankle  The various methods of treatment have been discussed with the patient and family. After consideration of risks, benefits and other options for treatment, the patient has consented to  Procedure(s): Left Ankle Syndesmotic Screw Removal (Left) as a surgical intervention .  The patient's history has been reviewed, patient examined, no change in status, stable for surgery.  I have reviewed the patient's chart and labs.  Questions were answered to the patient's satisfaction.     Eldred MangesMark C Mase Dhondt

## 2017-01-14 NOTE — Op Note (Signed)
Preop diagnosis: Left ankle fracture with retained syndesmotic screw  Postop diagnosis: Same  Procedure: Removal of left ankle syndesmotic screw  Surgeon: Annell GreeningMark Tuesday Terlecki M.D.  Anesthesia: IV sedation plus Marcaine local.  Procedure after induction of IV sedation standard prepping with DuraPrep Ancef prophylaxis C-arm was brought in for localization after externally sheets and drapes of been applied with the stockinette. Palpable screws of bone by hand second screw from the distal end of the plate and photo was taken with the scissors in line with the screw. Stab incision was made directly over the screw head screwdriver was inserted photograph was taken then the screw was removed. Freer elevator was used to clean off soft tissue over the screw head and the 3 different screwdrivers or try before we had the appropriate screwdriver to fit the screw. Cancellus partially threaded screw was removed irrigated and a single 2-0 nylon suture was placed followed by soft dressing 4 x 4 Curlex and Ace wrap. Patient was transferred recovery room and will be discharged home weightbearing as tolerated

## 2017-01-14 NOTE — H&P (Signed)
Hunter Cook is an 21 y.o. male.   Chief Complaint: Left ankle pain and retained hardware  HPI: patient s/p orif left ankle presents with above complaint.    Past Medical History:  Diagnosis Date  . Bipolar 1 disorder [facility](HCC)     Past Surgical History:  Procedure Laterality Date  . FRACTURE SURGERY    . INCISION AND DRAINAGE Right 2012   snake bite to hand; had ~ 6 ORs related to this  . ORIF ANKLE FRACTURE Left 10/22/2016   Procedure: OPEN REDUCTION INTERNAL FIXATION (ORIF) BIMALLEOLAR ANKLE FRACTURE;  Surgeon: Eldred MangesMark C Yates, MD;  Location: [facility] OR;  Service: Orthopedics;  Laterality: Left;  . ORIF ANKLE FRACTURE BIMALLEOLAR Left 10/22/2016  . SKIN GRAFT  2012   "related to snake bite"    Family History  Problem Relation Age of Onset  . CAD Father   . Heart attack Father    Social History:  reports that he has been smoking Cigarettes.  He has a 8.00 pack-year smoking history. His smokeless tobacco use includes Chew. He reports that he drinks about 1.2 oz of alcohol per week . He reports that he uses drugs, including Marijuana.  Allergies: No Known Allergies  No prescriptions prior to admission.    No results found for this or any previous visit (from the past 48 hour(s)). No results found.  Review of Systems  Constitutional: Negative.   HENT: Negative.   Respiratory: Negative.   Cardiovascular: Negative.   Genitourinary: Negative.   Musculoskeletal: Positive for joint pain.  Skin: Negative.   Neurological: Negative.   Psychiatric/Behavioral: Negative.     There were no vitals taken for this visit. Physical Exam  Constitutional: He is oriented to person, place, and time. He appears well-developed. No distress.  HENT:  Head: Normocephalic and atraumatic.  Eyes: EOM are normal. Pupils are equal, round, and reactive to light.  Neck: Normal range of motion.  Respiratory: No respiratory distress.  GI: He exhibits no distension.  Musculoskeletal: He exhibits  tenderness (ankle).  Neurological: He is alert and oriented to person, place, and time.  Skin: Skin is warm and dry.  Psychiatric: He has a normal mood and affect.     Assessment/Plan Left ankle pain and retained hardware  Will proceed with Left Ankle Syndesmotic Screw Removal as scheduled.  Procedure discussed and all questions answered.    Hunter KiefJames Jaiah Weigel, PA-C 01/14/2017, 9:28 AM

## 2017-01-14 NOTE — Transfer of Care (Signed)
Immediate Anesthesia Transfer of Care Note  Patient: Hunter Cook  Procedure(s) Performed: Procedure(s): Left Ankle Syndesmotic Screw Removal (Left)  Patient Location: PACU  Anesthesia Type:MAC  Level of Consciousness: awake, alert , oriented and patient cooperative  Airway & Oxygen Therapy: Patient Spontanous Breathing  Post-op Assessment: Report given to RN and Post -op Vital signs reviewed and stable  Post vital signs: Reviewed and stable  Last Vitals:  Vitals:   01/14/17 1259  BP: (!) 154/78  Pulse: 79  Resp: 20  Temp: 36.9 C    Last Pain:  Vitals:   01/14/17 1321  TempSrc:   PainSc: 8       Patients Stated Pain Goal: 8 (24/46/28 6381)  Complications: No apparent anesthesia complications

## 2017-01-15 ENCOUNTER — Encounter (HOSPITAL_COMMUNITY): Payer: Self-pay | Admitting: Orthopaedic Surgery

## 2017-01-22 ENCOUNTER — Ambulatory Visit (INDEPENDENT_AMBULATORY_CARE_PROVIDER_SITE_OTHER): Payer: Self-pay

## 2017-01-22 ENCOUNTER — Encounter (INDEPENDENT_AMBULATORY_CARE_PROVIDER_SITE_OTHER): Payer: Self-pay | Admitting: Orthopaedic Surgery

## 2017-01-22 ENCOUNTER — Ambulatory Visit (INDEPENDENT_AMBULATORY_CARE_PROVIDER_SITE_OTHER): Payer: No Typology Code available for payment source | Admitting: Orthopaedic Surgery

## 2017-01-22 VITALS — BP 142/95 | HR 77 | Ht 68.0 in | Wt 200.0 lb

## 2017-01-22 DIAGNOSIS — S82892D Other fracture of left lower leg, subsequent encounter for closed fracture with routine healing: Secondary | ICD-10-CM

## 2017-01-22 NOTE — Progress Notes (Signed)
Post-Op Visit Note   Patient: Hunter Cook           Date of Birth: 11/01/1996           MRN: [id number] Visit Date: 01/22/2017 PCP: No PCP Per Patient   Assessment & Plan:  Chief Complaint:  Chief Complaint  Patient presents with  . Left Ankle - Routine Post Op   Visit Diagnoses:  1. Closed fracture of left ankle with routine healing, subsequent encounter     Plan: He can gradually progress his weightbearing of from 2 crutches to 1 in the opposite side and then go to a cane. We discussed working on ankle range of motion. Suture was removed from the syndesmotic screw removal done as an outpatient. Return one month.  Follow-Up Instructions: Return in about 1 month (around 02/19/2017).   Orders:  Orders Placed This Encounter  Procedures  . XR Ankle Complete Left   No orders of the defined types were placed in this encounter.  HPI Patient returns for follow up status post left ankle syndesmotic screw removal 01/14/2017. He is 8 days post op. He states that the pain he had lateral ankle is better but the medial ankle pain is still there. He was taking Oxycodone for pain, but states that he is out. Suture is intact. He is nonweightbearing on crutches and in CAM boot.    Imaging: Xr Ankle Complete Left  Result Date: 01/22/2017 Three-view x-rays right ankle obtained post syndesmotic screw removal with continued medial ankle pain. Bimalleolar ankle fracture is completely healed. Remnant of sclerotic bone for the syndesmotic screw was removed as visualized. Impression: Healed bimalleolar ankle fracture. Syndesmotic screw removal. There is calcification of the interosseous membrane from the old injury. Fracture has healed.   PMFS History: Patient Active Problem List   Diagnosis Date Noted  . Closed left ankle fracture 10/22/2016  . Ankle syndesmosis disruption, left, initial encounter 10/22/2016  . Trimalleolar fracture of ankle, closed, left, initial encounter 10/17/2016    Past Medical History:  Diagnosis Date  . Bipolar 1 disorder (HCC)     Family History  Problem Relation Age of Onset  . CAD Father   . Heart attack Father     Past Surgical History:  Procedure Laterality Date  . FRACTURE SURGERY    . HARDWARE REMOVAL Left 01/14/2017   Procedure: Left Ankle Syndesmotic Screw Removal;  Surgeon: Eldred MangesMark C Yates, MD;  [facility];  Service: Orthopedics;  Laterality: Left;  . INCISION AND DRAINAGE Right 2012   snake bite to hand; had ~ 6 ORs related to this  . ORIF ANKLE FRACTURE Left 10/22/2016   Procedure: OPEN REDUCTION INTERNAL FIXATION (ORIF) BIMALLEOLAR ANKLE FRACTURE;  Surgeon: Eldred MangesMark C Yates, MD;  Location: [facility] OR;  Service: Orthopedics;  Laterality: Left;  . ORIF ANKLE FRACTURE BIMALLEOLAR Left 10/22/2016  . SKIN GRAFT  2012   "related to snake bite"   Social History   Occupational History  . Not on file.   Social History Main Topics  . Smoking status: Current Every Day Smoker    Packs/day: 1.00    Years: 8.00    Types: Cigarettes  . Smokeless tobacco: Current User    Types: Chew     Comment: quit using chewing tobacco  . Alcohol use 1.2 oz/week    2 Cans of beer per week  . Drug use: Yes    Types: Marijuana     Comment: 10/22/2016 "qd"  . Sexual activity: Yes

## 2017-02-26 ENCOUNTER — Ambulatory Visit (INDEPENDENT_AMBULATORY_CARE_PROVIDER_SITE_OTHER): Payer: Self-pay | Admitting: Orthopaedic Surgery

## 2017-03-01 ENCOUNTER — Ambulatory Visit (INDEPENDENT_AMBULATORY_CARE_PROVIDER_SITE_OTHER): Payer: No Typology Code available for payment source | Admitting: Orthopaedic Surgery

## 2017-03-01 ENCOUNTER — Ambulatory Visit (INDEPENDENT_AMBULATORY_CARE_PROVIDER_SITE_OTHER): Payer: Self-pay

## 2017-03-01 ENCOUNTER — Encounter (INDEPENDENT_AMBULATORY_CARE_PROVIDER_SITE_OTHER): Payer: Self-pay | Admitting: Orthopaedic Surgery

## 2017-03-01 VITALS — BP 137/85 | HR 83 | Ht 68.0 in | Wt 190.0 lb

## 2017-03-01 DIAGNOSIS — M25572 Pain in left ankle and joints of left foot: Secondary | ICD-10-CM

## 2017-03-01 DIAGNOSIS — S82852A Displaced trimalleolar fracture of left lower leg, initial encounter for closed fracture: Secondary | ICD-10-CM

## 2017-03-05 DIAGNOSIS — M25572 Pain in left ankle and joints of left foot: Secondary | ICD-10-CM | POA: Insufficient documentation
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
## 2017-03-25 ENCOUNTER — Telehealth (INDEPENDENT_AMBULATORY_CARE_PROVIDER_SITE_OTHER): Payer: Self-pay

## 2017-03-25 NOTE — Telephone Encounter (Signed)
Faxed completed 25r form back to [employer]. Rating is 10% to the left foot.

## 2017-03-29 ENCOUNTER — Ambulatory Visit (INDEPENDENT_AMBULATORY_CARE_PROVIDER_SITE_OTHER): Payer: Self-pay | Admitting: Orthopaedic Surgery

## 2017-06-10 NOTE — Addendum Note (Signed)
Addendum  created 06/10/17 1539 by Jairo BenJackson, Fardeen Steinberger, MD   Sign clinical note

## 2017-06-10 NOTE — Anesthesia Postprocedure Evaluation (Deleted)
Anesthesia Post Note  Patient: Hunter SereneCody L Mouser  Procedure(s) Performed: Procedure(s) (LRB): Left Ankle Syndesmotic Screw Removal (Left)     Anesthesia Post Evaluation  Last Vitals:  Vitals:   01/14/17 1515 01/14/17 1530  BP: 114/80 125/80  Pulse: 74 81  Resp: 19 15  Temp:  36.7 C    Last Pain:  Vitals:   01/14/17 1545  TempSrc:   PainSc: 4                  Jimy Gates,E. Amonte Brookover

## 2017-06-10 NOTE — Addendum Note (Signed)
Addendum  created 06/10/17 1623 by Jairo BenJackson, Olajuwon Fosdick, MD   Delete clinical note, Sign clinical note

## 2018-07-10 ENCOUNTER — Emergency Department (HOSPITAL_COMMUNITY): Payer: Self-pay

## 2018-07-10 ENCOUNTER — Encounter (HOSPITAL_COMMUNITY): Payer: Self-pay

## 2018-07-10 ENCOUNTER — Emergency Department (HOSPITAL_COMMUNITY)
Admission: EM | Admit: 2018-07-10 | Discharge: 2018-07-10 | Disposition: A | Discharge status: Home / Self Care | Payer: Self-pay | Attending: Emergency Medicine | Admitting: Emergency Medicine

## 2018-07-10 ENCOUNTER — Other Ambulatory Visit: Payer: Self-pay

## 2018-07-10 DIAGNOSIS — F17210 Nicotine dependence, cigarettes, uncomplicated: Secondary | ICD-10-CM | POA: Insufficient documentation

## 2018-07-10 DIAGNOSIS — L03115 Cellulitis of right lower limb: Secondary | ICD-10-CM | POA: Insufficient documentation

## 2018-07-10 LAB — CBC WITH DIFFERENTIAL/PLATELET
Abs Immature Granulocytes: 0.1 10*3/uL (ref 0.0–0.1)
BASOS ABS: 0.1 10*3/uL (ref 0.0–0.1)
BASOS PCT: 1 %
EOS ABS: 0.1 10*3/uL (ref 0.0–0.7)
EOS PCT: 0 %
HCT: 43.5 % (ref 39.0–52.0)
Hemoglobin: 14.3 g/dL (ref 13.0–17.0)
IMMATURE GRANULOCYTES: 1 %
Lymphocytes Relative: 12 %
Lymphs Abs: 1.6 10*3/uL (ref 0.7–4.0)
MCH: 30.5 pg (ref 26.0–34.0)
MCHC: 32.9 g/dL (ref 30.0–36.0)
MCV: 92.8 fL (ref 78.0–100.0)
Monocytes Absolute: 1 10*3/uL (ref 0.1–1.0)
Monocytes Relative: 7 %
Neutro Abs: 10.5 10*3/uL — ABNORMAL HIGH (ref 1.7–7.7)
Neutrophils Relative %: 79 %
PLATELETS: 257 10*3/uL (ref 150–400)
RBC: 4.69 MIL/uL (ref 4.22–5.81)
RDW: 14.6 % (ref 11.5–15.5)
WBC: 13.2 10*3/uL — AB (ref 4.0–10.5)

## 2018-07-10 LAB — BASIC METABOLIC PANEL
Anion gap: 10 (ref 5–15)
BUN: 15 mg/dL (ref 6–20)
CALCIUM: 9.3 mg/dL (ref 8.9–10.3)
CHLORIDE: 104 mmol/L (ref 98–111)
CO2: 23 mmol/L (ref 22–32)
CREATININE: 1.08 mg/dL (ref 0.61–1.24)
GFR calc Af Amer: >60 mL/min (ref >60)
GFR calc non Af Amer: >60 mL/min (ref >60)
Glucose, Bld: 119 mg/dL — ABNORMAL HIGH (ref 70–99)
Potassium: 3.7 mmol/L (ref 3.5–5.1)
Sodium: 137 mmol/L (ref 135–145)

## 2018-07-10 LAB — I-STAT CG4 LACTIC ACID, ED: Lactic Acid, Venous: 0.94 mmol/L (ref 0.5–1.9)

## 2018-07-10 MED ORDER — HYDROMORPHONE HCL 1 MG/ML IJ SOLN
1.0000 mg | Freq: Once | INTRAMUSCULAR | Status: AC
  Administered 2018-07-10: 1 mg via INTRAVENOUS
  Filled 2018-07-10: qty 1

## 2018-07-10 MED ORDER — OXYCODONE-ACETAMINOPHEN 5-325 MG PO TABS: 2.0000 | ORAL_TABLET | Freq: Four times a day (QID) | ORAL | 0 refills | Status: AC | PRN

## 2018-07-10 MED ORDER — CLINDAMYCIN PHOSPHATE 600 MG/50ML IV SOLN
600.0000 mg | Freq: Once | INTRAVENOUS | Status: AC
  Administered 2018-07-10: 600 mg via INTRAVENOUS
  Filled 2018-07-10: qty 50

## 2018-07-10 MED ORDER — IOHEXOL 300 MG/ML  SOLN
100.0000 mL | Freq: Once | INTRAMUSCULAR | Status: AC | PRN
  Administered 2018-07-10: 100 mL via INTRAVENOUS

## 2018-07-10 MED ORDER — MORPHINE SULFATE (PF) 4 MG/ML IV SOLN
4.0000 mg | Freq: Once | INTRAVENOUS | Status: AC
  Administered 2018-07-10: 4 mg via INTRAVENOUS
  Filled 2018-07-10: qty 1

## 2018-07-10 MED ORDER — ONDANSETRON HCL 4 MG PO TABS: 4.0000 mg | ORAL_TABLET | Freq: Four times a day (QID) | ORAL | 0 refills | Status: AC

## 2018-07-10 MED ORDER — CLINDAMYCIN HCL 300 MG PO CAPS: 300.0000 mg | ORAL_CAPSULE | Freq: Three times a day (TID) | ORAL | 0 refills | Status: AC

## 2018-07-10 NOTE — ED Triage Notes (Signed)
Pt arrives to ED from home with complaints of right ankle swelling and redness since two days ago after being bit by what he thinks was a spider. pt reports the redness has gotten worse and what used to be one bite mark has progressed into several "pimple looking things". Pt has tried to treat it at home with neosporin and hydrogen peroxide. Pt placed in position of comfort with bed locked and lowered, call bell in reach.

## 2018-07-10 NOTE — ED Provider Notes (Signed)
[facility] Provider Note   CSN: [id number] Arrival date & time: 07/10/18  1334     History   Chief Complaint Chief Complaint  Patient presents with  . Insect Bite    HPI Hunter Cook is a 22 y.o. male ending for evaluation of redness, pain, and swelling of his right foot.  Patient states that 2 days ago, he noticed an area of redness and some pain of his right lower leg.  Since then, his entire right lower leg, foot, and ankle has become red and swollen and painful.  It is to the point where he can no longer walk due to pain.  He denies numbness or tingling.  He states it radiates up the back of his calf.  He is unsure if he was bit by anything.  He denies fall, trauma, or injury.  He denies history of similar.  He takes testosterone shots, is on no other medications.  He is not on any blood thinners.  He is immunocompetent.  Is been using Neosporin and hydrogen peroxide without improvement of symptoms.  He has not tried anything else.  He denies symptoms elsewhere on his body including his other foot.  Patient denies fevers, chills, chest pain, shortness of breath, nausea, vomiting, abdominal pain.  HPI  Past Medical History:  Diagnosis Date  . Bipolar 1 disorder [facility](HCC)     Patient Active Problem List   Diagnosis Date Noted  . Pain in left ankle and joints of left foot 03/05/2017  . Closed left ankle fracture 10/22/2016  . Ankle syndesmosis disruption, left, initial encounter 10/22/2016  . Trimalleolar fracture of ankle, closed, left, initial encounter 10/17/2016    Past Surgical History:  Procedure Laterality Date  . FRACTURE SURGERY    . HARDWARE REMOVAL Left 01/14/2017   Procedure: Left Ankle Syndesmotic Screw Removal;  Surgeon: Eldred MangesMark C Yates, MD;  [facility];  Service: Orthopedics;  Laterality: Left;  . INCISION AND DRAINAGE Right 2012   snake bite to hand; had ~ 6 ORs related to this  . ORIF ANKLE FRACTURE Left 10/22/2016   Procedure: OPEN REDUCTION INTERNAL FIXATION (ORIF) BIMALLEOLAR ANKLE FRACTURE;  Surgeon: Eldred MangesMark C Yates, MD;  Location: [facility] OR;  Service: Orthopedics;  Laterality: Left;  . ORIF ANKLE FRACTURE BIMALLEOLAR Left 10/22/2016  . SKIN GRAFT  2012   "related to snake bite"        Home Medications    Prior to Admission medications   Medication Sig Start Date End Date Taking? Authorizing Provider  ibuprofen (ADVIL,MOTRIN) 600 MG tablet Take 1 tablet (600 mg total) by mouth every 6 (six) hours as needed. Patient not taking: Reported on 01/01/2017 10/16/16   Jaynie CrumbleKirichenko, Tatyana, PA-C  oxyCODONE-acetaminophen (ROXICET) 5-325 MG tablet Take 1-2 tablets by mouth every 8 (eight) hours as needed for severe pain. Patient not taking: Reported on 01/22/2017 01/14/17   Naida Sleightwens, James M, PA-C    Family History Family History  Problem Relation Age of Onset  . CAD Father   . Heart attack Father     Social History Social History   Tobacco Use  . Smoking status: Current Every Day Smoker    Packs/day: 0.50    Years: 8.00    Pack years: 4.00    Types: Cigarettes  . Smokeless tobacco: Current User    Types: Chew  . Tobacco comment: quit using chewing tobacco  Substance Use Topics  . Alcohol use: Yes    Alcohol/week: 40.0 standard  drinks    Types: 40 Cans of beer per week    Comment: every day use  . Drug use: Yes    Types: Marijuana    Comment: rarely     Allergies   Patient has no known allergies.   Review of Systems Review of Systems  Musculoskeletal: Positive for arthralgias, joint swelling and myalgias.  Skin: Positive for color change.  All other systems reviewed and are negative.    Physical Exam Updated Vital Signs BP (!) 144/87 (BP Location: Right Arm)   Pulse 95   Temp 98.8 F (37.1 C) (Oral)   Ht 5\' 8"  (1.727 m)   Wt 88.5 kg   SpO2 100%   BMI 29.65 kg/m   Physical Exam  Constitutional: He is oriented to person, place, and time. He appears well-developed and  well-nourished. No distress.  Appears uncomfortable due to pain, no acute distress.  HENT:  Head: Normocephalic and atraumatic.  Eyes: Pupils are equal, round, and reactive to light. Conjunctivae and EOM are normal.  Neck: Normal range of motion. Neck supple.  Cardiovascular: Normal rate, regular rhythm and intact distal pulses.  Pulmonary/Chest: Effort normal and breath sounds normal. No respiratory distress. He has no wheezes.  Abdominal: Soft. He exhibits no distension. There is no tenderness.  Musculoskeletal: Normal range of motion. He exhibits tenderness.  Obvious swelling of the right foot, ankle, and lower leg.  Pedal pulses intact bilaterally.  Erythema noted of the skin.  Significant tenderness palpation of the right foot.  No streaking noted up the calf.  She is unable to range ankle due to pain.  No difficulty ranging knee and toes.  Sensation intact, good cap refill.  Neurological: He is alert and oriented to person, place, and time.  Skin: Skin is warm and dry. Capillary refill takes less than 2 seconds. There is erythema.  See picture below.  Generalized erythema of the lower leg and foot with localized area of deeper redness with small blood and pus filled vesicles.  Psychiatric: He has a normal mood and affect.  Nursing note and vitals reviewed.       ED Treatments / Results  Labs (all labs ordered are listed, but only abnormal results are displayed) Labs Reviewed  CBC WITH DIFFERENTIAL/PLATELET - Abnormal; Notable for the following components:      Result Value   WBC 13.2 (*)    Neutro Abs 10.5 (*)    All other components within normal limits  BASIC METABOLIC PANEL  I-STAT CG4 LACTIC ACID, ED    EKG None  Radiology No results found.  Procedures Procedures (including critical care time)  Medications Ordered in ED Medications  clindamycin (CLEOCIN) IVPB 600 mg (600 mg Intravenous New Bag/Given 07/10/18 1530)  morphine 4 MG/ML injection 4 mg (4 mg  Intravenous Given 07/10/18 1530)     Initial Impression / Assessment and Plan / ED Course  I have reviewed the triage vital signs and the nursing notes.  Pertinent labs & imaging results that were available during my care of the patient were reviewed by me and considered in my medical decision making (see chart for details).  Clinical Course as of Jul 11 1735  Thu Jul 10, 2018  1707 Case discussed with Daphine Deutscher, PA-C. Pending CT to evaluate for deep tissue infection. Patient has received IV Clindamycin 600mg  x1 already. If no deeper structures affected, patient can be discharged with PO antibiotics and pain medications. If abnormal CT, then follow-up with surgery for possible  intervention.   [GM]    Clinical Course User Index [GM] Windy CarinaMortis, Gabrielle I, [place]PA-C    Patient presenting for evaluation of right lower leg pain and swelling.  Physical exam concerning for infection.  Patient states it is to the point where he cannot walk.  Significant tenderness when checking pedal pulses.  However, patient is afebrile not tachycardic.  Appears nontoxic.  Doubt systemic infection, however will obtain labs, lactic, and x-ray to rule out subcu gas.  CT soft tissue to rule out deep space infection.  Will start antibiotics and medication for pain while work-up is proceeding.  Discussed with attending, Dr. Criss AlvineGoldston agrees to plan.  Labs show leukocytosis at 13.5, which is consistent with an infection.  Lactic reassuring.  Otherwise, labs are reassuring.  Pain mildly improved with morphine, but returned quickly.  Will give dose of Dilaudid.  CT pending.  Pt signed out to [employer] Mortis, PA-C for f/u on CT foot.   Final Clinical Impressions(s) / ED Diagnoses   Final diagnoses:  None    ED Discharge Orders    None       Alveria ApleyCaccavale, Nana [place], PA-C 07/10/18 1736    Pricilla LovelessGoldston, Scott, MD 07/14/18 1354

## 2018-07-10 NOTE — ED Provider Notes (Addendum)
  Physical Exam  BP 122/82   Pulse 86   Temp 98.8 F (37.1 C) (Oral)   Ht 5\' 8"  (1.727 m)   Wt 88.5 kg   SpO2 96%   BMI 29.65 kg/m   Physical Exam  Constitutional: Vital signs are normal. He appears well-developed and well-nourished. No distress.  Musculoskeletal:       Right lower leg: He exhibits tenderness.       Right foot: There is decreased range of motion, tenderness and swelling.  Right foot is erythematous with a more pronounced erythematous patch with 4 pustules superior to medial malleolus. Right foot and calf exquistely tender to light palpation. Minimal ROM with toe flexion. Unable to bear weight due to pain.  Skin: Capillary refill takes 2 to 3 seconds. Rash noted. There is erythema.  Nursing note and vitals reviewed.     ED Course/Procedures   Clinical Course as of Jul 10 1899  Thu Jul 10, 2018  1707 Case discussed with Hunter DeutscherSofia Caccavale, PA-C. Pending CT to evaluate for deep tissue infection. Patient has received IV Clindamycin 600mg  x1 already. If no deeper structures affected, patient can be discharged with PO antibiotics and pain medications. If abnormal CT, then follow-up with surgery for possible intervention.   [GM]  1853 CT of right lower extremity shows inflammation of SQ tissue. No abscesses visualized. No infiltration or infection of bones, muscles or deeper structures.   [GM]    Clinical Course User Index [GM] Hunter Cook, Hunter MedicusGabrielle I, PA-C    MDM  Hunter Cook is a 22 yo male with no significant medical history who presented to the ED for right foot pain x2 days. He reports gradually worsening pain, redness and inability to bear weight on right foot. [facility]avale[facility] PA-C. Cellulitis apparent, but given worsening of symptoms there is concern of infection penetrating deeper structures like muscle and bone. Fortunately, CT of the right lower extremity only showed that the infection was limited to the dermis and SQ layers. IV Diluadid given  for pain control.  1. Cellulitis. IV Clindamycin 600mg  x1 given in the ED. Prescribed Clindamycin 300mg  QID x7 days for outpatient treatment. Roxicet prescribed for pain. No active Rxs in [place] Controlled Substance Database. Erythematous area was outlined with skin marker. Education provided on follow-up and care.   Dispo: Home. After thorough clinical evaluation, this patient is determined to be medically stable and can be safely discharged with the previously mentioned treatment and/or outpatient follow-up/referral(s). At this time, there are no other apparent medical conditions that require further screening, evaluation or treatment.      Hunter Cook, Hunter Shorten I, PA-C 07/10/18 1935    [address] StreetMortis, [name], PA-C 07/10/18 1936    Hunter Cook, Scott, MD 07/10/18 2236

## 2018-07-10 NOTE — ED Notes (Signed)
Patient transported to CT 

## 2018-07-10 NOTE — Discharge Instructions (Addendum)
Your imaging done today shows that the infection is currently limited to the skin and fat layers of your leg. It is not in the muscles, bones or other deep structures of the foot. This is a positive thing.   You have received 1 dose of antibiotics today for treatment, but you will need to continue taking the medication, Clindamycin, for the next 7 days.  Be sure to monitor the red area on your leg. I have outlined it and if the redness, starts to spread please return to the emergency department. Also return if the pain becomes more severe, you have pain further up the leg, numbness in the foot or you begin running a fever.  I have prescribed you pain medication and also some nausea medication as well to help with any side effects from this or the antibiotics.

## 2018-11-26 DIAGNOSIS — S069XAA Unspecified intracranial injury with loss of consciousness status unknown, initial encounter: Secondary | ICD-10-CM

## 2018-11-26 DIAGNOSIS — Z9889 Other specified postprocedural states: Secondary | ICD-10-CM

## 2018-11-26 HISTORY — DX: Other specified postprocedural states: Z98.890

## 2018-11-26 HISTORY — DX: Unspecified intracranial injury with loss of consciousness status unknown, initial encounter: S06.9XAA

## 2018-12-19 ENCOUNTER — Emergency Department (HOSPITAL_COMMUNITY): Payer: Medicaid Other

## 2018-12-19 ENCOUNTER — Encounter (HOSPITAL_COMMUNITY): Payer: Self-pay | Admitting: General Surgery

## 2018-12-19 ENCOUNTER — Inpatient Hospital Stay (HOSPITAL_COMMUNITY)
Admission: EM | Admit: 2018-12-19 | Discharge: 2019-02-10 | DRG: 003 | Disposition: A | Discharge status: Rehabilitation Facility | Payer: Medicaid Other | Attending: General Surgery | Admitting: General Surgery

## 2018-12-19 DIAGNOSIS — Z781 Physical restraint status: Secondary | ICD-10-CM

## 2018-12-19 DIAGNOSIS — J9601 Acute respiratory failure with hypoxia: Secondary | ICD-10-CM | POA: Diagnosis present

## 2018-12-19 DIAGNOSIS — J9690 Respiratory failure, unspecified, unspecified whether with hypoxia or hypercapnia: Secondary | ICD-10-CM

## 2018-12-19 DIAGNOSIS — I615 Nontraumatic intracerebral hemorrhage, intraventricular: Secondary | ICD-10-CM

## 2018-12-19 DIAGNOSIS — R402112 Coma scale, eyes open, never, at arrival to emergency department: Secondary | ICD-10-CM | POA: Diagnosis present

## 2018-12-19 DIAGNOSIS — J189 Pneumonia, unspecified organism: Secondary | ICD-10-CM | POA: Diagnosis not present

## 2018-12-19 DIAGNOSIS — S069X9A Unspecified intracranial injury with loss of consciousness of unspecified duration, initial encounter: Secondary | ICD-10-CM

## 2018-12-19 DIAGNOSIS — S27321A Contusion of lung, unilateral, initial encounter: Secondary | ICD-10-CM | POA: Diagnosis present

## 2018-12-19 DIAGNOSIS — D62 Acute posthemorrhagic anemia: Secondary | ICD-10-CM | POA: Diagnosis present

## 2018-12-19 DIAGNOSIS — S52272A Monteggia's fracture of left ulna, initial encounter for closed fracture: Secondary | ICD-10-CM

## 2018-12-19 DIAGNOSIS — E876 Hypokalemia: Secondary | ICD-10-CM | POA: Diagnosis present

## 2018-12-19 DIAGNOSIS — N390 Urinary tract infection, site not specified: Secondary | ICD-10-CM | POA: Diagnosis not present

## 2018-12-19 DIAGNOSIS — J939 Pneumothorax, unspecified: Secondary | ICD-10-CM

## 2018-12-19 DIAGNOSIS — R402222 Coma scale, best verbal response, incomprehensible words, at arrival to emergency department: Secondary | ICD-10-CM | POA: Diagnosis present

## 2018-12-19 DIAGNOSIS — S06359A Traumatic hemorrhage of left cerebrum with loss of consciousness of unspecified duration, initial encounter: Secondary | ICD-10-CM | POA: Diagnosis not present

## 2018-12-19 DIAGNOSIS — S52209A Unspecified fracture of shaft of unspecified ulna, initial encounter for closed fracture: Secondary | ICD-10-CM

## 2018-12-19 DIAGNOSIS — S3609XA Other injury of spleen, initial encounter: Secondary | ICD-10-CM

## 2018-12-19 DIAGNOSIS — F10129 Alcohol abuse with intoxication, unspecified: Secondary | ICD-10-CM | POA: Diagnosis present

## 2018-12-19 DIAGNOSIS — Z930 Tracheostomy status: Secondary | ICD-10-CM

## 2018-12-19 DIAGNOSIS — K5900 Constipation, unspecified: Secondary | ICD-10-CM | POA: Diagnosis not present

## 2018-12-19 DIAGNOSIS — R509 Fever, unspecified: Secondary | ICD-10-CM

## 2018-12-19 DIAGNOSIS — S2242XA Multiple fractures of ribs, left side, initial encounter for closed fracture: Secondary | ICD-10-CM | POA: Diagnosis present

## 2018-12-19 DIAGNOSIS — S12691A Other nondisplaced fracture of seventh cervical vertebra, initial encounter for closed fracture: Secondary | ICD-10-CM | POA: Diagnosis present

## 2018-12-19 DIAGNOSIS — Z419 Encounter for procedure for purposes other than remedying health state, unspecified: Secondary | ICD-10-CM

## 2018-12-19 DIAGNOSIS — E872 Acidosis, unspecified: Secondary | ICD-10-CM

## 2018-12-19 DIAGNOSIS — K661 Hemoperitoneum: Secondary | ICD-10-CM | POA: Diagnosis present

## 2018-12-19 DIAGNOSIS — R578 Other shock: Secondary | ICD-10-CM | POA: Diagnosis present

## 2018-12-19 DIAGNOSIS — S2249XA Multiple fractures of ribs, unspecified side, initial encounter for closed fracture: Secondary | ICD-10-CM

## 2018-12-19 DIAGNOSIS — S42112A Displaced fracture of body of scapula, left shoulder, initial encounter for closed fracture: Secondary | ICD-10-CM | POA: Diagnosis present

## 2018-12-19 DIAGNOSIS — S06369D Traumatic hemorrhage of cerebrum, unspecified, with loss of consciousness of unspecified duration, subsequent encounter: Secondary | ICD-10-CM | POA: Diagnosis not present

## 2018-12-19 DIAGNOSIS — S2239XA Fracture of one rib, unspecified side, initial encounter for closed fracture: Secondary | ICD-10-CM

## 2018-12-19 DIAGNOSIS — R0989 Other specified symptoms and signs involving the circulatory and respiratory systems: Secondary | ICD-10-CM

## 2018-12-19 DIAGNOSIS — Z0189 Encounter for other specified special examinations: Secondary | ICD-10-CM

## 2018-12-19 DIAGNOSIS — R451 Restlessness and agitation: Secondary | ICD-10-CM

## 2018-12-19 DIAGNOSIS — S2232XA Fracture of one rib, left side, initial encounter for closed fracture: Secondary | ICD-10-CM

## 2018-12-19 DIAGNOSIS — I10 Essential (primary) hypertension: Secondary | ICD-10-CM | POA: Diagnosis present

## 2018-12-19 DIAGNOSIS — S42102A Fracture of unspecified part of scapula, left shoulder, initial encounter for closed fracture: Secondary | ICD-10-CM

## 2018-12-19 DIAGNOSIS — S42402A Unspecified fracture of lower end of left humerus, initial encounter for closed fracture: Secondary | ICD-10-CM | POA: Diagnosis present

## 2018-12-19 DIAGNOSIS — S270XXA Traumatic pneumothorax, initial encounter: Secondary | ICD-10-CM | POA: Diagnosis present

## 2018-12-19 DIAGNOSIS — R4182 Altered mental status, unspecified: Secondary | ICD-10-CM | POA: Diagnosis present

## 2018-12-19 DIAGNOSIS — Z23 Encounter for immunization: Secondary | ICD-10-CM

## 2018-12-19 DIAGNOSIS — Z9911 Dependence on respirator [ventilator] status: Secondary | ICD-10-CM | POA: Diagnosis not present

## 2018-12-19 DIAGNOSIS — S22048A Other fracture of fourth thoracic vertebra, initial encounter for closed fracture: Secondary | ICD-10-CM | POA: Diagnosis not present

## 2018-12-19 DIAGNOSIS — Z4659 Encounter for fitting and adjustment of other gastrointestinal appliance and device: Secondary | ICD-10-CM

## 2018-12-19 DIAGNOSIS — R404 Transient alteration of awareness: Secondary | ICD-10-CM

## 2018-12-19 DIAGNOSIS — M25512 Pain in left shoulder: Secondary | ICD-10-CM

## 2018-12-19 DIAGNOSIS — Z978 Presence of other specified devices: Secondary | ICD-10-CM

## 2018-12-19 DIAGNOSIS — D72829 Elevated white blood cell count, unspecified: Secondary | ICD-10-CM

## 2018-12-19 DIAGNOSIS — Y92410 Unspecified street and highway as the place of occurrence of the external cause: Secondary | ICD-10-CM

## 2018-12-19 DIAGNOSIS — T1490XA Injury, unspecified, initial encounter: Secondary | ICD-10-CM

## 2018-12-19 DIAGNOSIS — F17210 Nicotine dependence, cigarettes, uncomplicated: Secondary | ICD-10-CM | POA: Diagnosis present

## 2018-12-19 DIAGNOSIS — S36039A Unspecified laceration of spleen, initial encounter: Secondary | ICD-10-CM | POA: Diagnosis not present

## 2018-12-19 DIAGNOSIS — R402312 Coma scale, best motor response, none, at arrival to emergency department: Secondary | ICD-10-CM | POA: Diagnosis present

## 2018-12-19 DIAGNOSIS — T148XXA Other injury of unspecified body region, initial encounter: Secondary | ICD-10-CM

## 2018-12-19 DIAGNOSIS — L8990 Pressure ulcer of unspecified site, unspecified stage: Secondary | ICD-10-CM

## 2018-12-19 DIAGNOSIS — B9620 Unspecified Escherichia coli [E. coli] as the cause of diseases classified elsewhere: Secondary | ICD-10-CM | POA: Diagnosis not present

## 2018-12-19 DIAGNOSIS — F319 Bipolar disorder, unspecified: Secondary | ICD-10-CM | POA: Diagnosis present

## 2018-12-19 HISTORY — DX: Bitten by nonvenomous snake, initial encounter: W59.11XA

## 2018-12-19 HISTORY — DX: Bipolar disorder, unspecified: F31.9

## 2018-12-19 LAB — CBC
HCT: 46.7 % (ref 39.0–52.0)
Hemoglobin: 15.1 g/dL (ref 13.0–17.0)
MCH: 30.2 pg (ref 26.0–34.0)
MCHC: 32.3 g/dL (ref 30.0–36.0)
MCV: 93.4 fL (ref 80.0–100.0)
Platelets: 319 K/uL (ref 150–400)
RBC: 5 MIL/uL (ref 4.22–5.81)
RDW: 12.9 % (ref 11.5–15.5)
WBC: 10.2 K/uL (ref 4.0–10.5)
nRBC: 0 % (ref 0.0–0.2)

## 2018-12-19 LAB — COMPREHENSIVE METABOLIC PANEL WITH GFR
ALT: 67 U/L — ABNORMAL HIGH (ref 0–44)
AST: 89 U/L — ABNORMAL HIGH (ref 15–41)
Albumin: 4.2 g/dL (ref 3.5–5.0)
Alkaline Phosphatase: 49 U/L (ref 38–126)
Anion gap: 19 — ABNORMAL HIGH (ref 5–15)
BUN: 8 mg/dL (ref 6–20)
CO2: 17 mmol/L — ABNORMAL LOW (ref 22–32)
Calcium: 8.4 mg/dL — ABNORMAL LOW (ref 8.9–10.3)
Chloride: 103 mmol/L (ref 98–111)
Creatinine, Ser: 1.11 mg/dL (ref 0.61–1.24)
GFR calc Af Amer: >60 mL/min
GFR calc non Af Amer: >60 mL/min
Glucose, Bld: 195 mg/dL — ABNORMAL HIGH (ref 70–99)
Potassium: 3.7 mmol/L (ref 3.5–5.1)
Sodium: 139 mmol/L (ref 135–145)
Total Bilirubin: 0.4 mg/dL (ref 0.3–1.2)
Total Protein: 7.1 g/dL (ref 6.5–8.1)

## 2018-12-19 LAB — POCT I-STAT 7, (LYTES, BLD GAS, ICA,H+H)
Acid-base deficit: 9 mmol/L — ABNORMAL HIGH (ref 0.0–2.0)
Bicarbonate: 17.8 mmol/L — ABNORMAL LOW (ref 20.0–28.0)
Calcium, Ion: 1.05 mmol/L — ABNORMAL LOW (ref 1.15–1.40)
HCT: 42 % (ref 39.0–52.0)
Hemoglobin: 14.3 g/dL (ref 13.0–17.0)
O2 Saturation: 100 %
PO2 ART: 354 mmHg — AB (ref 83.0–108.0)
Potassium: 3.9 mmol/L (ref 3.5–5.1)
Sodium: 138 mmol/L (ref 135–145)
TCO2: 19 mmol/L — ABNORMAL LOW (ref 22–32)
pCO2 arterial: 42.2 mmHg (ref 32.0–48.0)
pH, Arterial: 7.232 — ABNORMAL LOW (ref 7.350–7.450)

## 2018-12-19 LAB — ABO/RH: ABO/RH(D): A POS

## 2018-12-19 LAB — PROTIME-INR
INR: 1.06
Prothrombin Time: 13.7 seconds (ref 11.4–15.2)

## 2018-12-19 LAB — CDS SEROLOGY

## 2018-12-19 LAB — ETHANOL: Alcohol, Ethyl (B): 264 mg/dL — ABNORMAL HIGH

## 2018-12-19 LAB — LACTIC ACID, PLASMA: Lactic Acid, Venous: 3.6 mmol/L (ref 0.5–1.9)

## 2018-12-19 MED ORDER — TETANUS-DIPHTH-ACELL PERTUSSIS 5-2.5-18.5 LF-MCG/0.5 IM SUSP
0.5000 mL | Freq: Once | INTRAMUSCULAR | Status: AC
  Administered 2018-12-19: 0.5 mL via INTRAMUSCULAR
  Filled 2018-12-19: qty 0.5

## 2018-12-19 MED ORDER — ONDANSETRON 4 MG PO TBDP: 4.0000 mg | ORAL_TABLET | Freq: Four times a day (QID) | ORAL | Status: DC | PRN

## 2018-12-19 MED ORDER — SODIUM CHLORIDE 0.9 % IV SOLN
INTRAVENOUS | Status: AC | PRN
  Administered 2018-12-19: 1000 mL via INTRAVENOUS

## 2018-12-19 MED ORDER — SUCCINYLCHOLINE CHLORIDE 20 MG/ML IJ SOLN
INTRAMUSCULAR | Status: AC | PRN
  Administered 2018-12-19: 150 mg via INTRAVENOUS

## 2018-12-19 MED ORDER — PROPOFOL 1000 MG/100ML IV EMUL
5.0000 ug/kg/min | INTRAVENOUS | Status: DC
  Administered 2018-12-19: 20 ug/kg/min via INTRAVENOUS
  Administered 2018-12-20: 80 ug/kg/min via INTRAVENOUS
  Administered 2018-12-20: 60 ug/kg/min via INTRAVENOUS
  Administered 2018-12-20: 45 ug/kg/min via INTRAVENOUS
  Administered 2018-12-20: 60 ug/kg/min via INTRAVENOUS
  Administered 2018-12-20: 03:00:00 via INTRAVENOUS
  Administered 2018-12-20 – 2018-12-21 (×5): 60 ug/kg/min via INTRAVENOUS
  Administered 2018-12-21: 70 ug/kg/min via INTRAVENOUS
  Administered 2018-12-21 (×3): 60 ug/kg/min via INTRAVENOUS
  Filled 2018-12-19 (×3): qty 100
  Filled 2018-12-19: qty 200
  Filled 2018-12-19 (×10): qty 100

## 2018-12-19 MED ORDER — PROPOFOL 10 MG/ML IV BOLUS
INTRAVENOUS | Status: AC | PRN
  Administered 2018-12-19: 30 ug via INTRAVENOUS
  Administered 2018-12-19: 20 ug via INTRAVENOUS

## 2018-12-19 MED ORDER — DEXTROSE-NACL 5-0.9 % IV SOLN
INTRAVENOUS | Status: DC
  Administered 2018-12-19: via INTRAVENOUS

## 2018-12-19 MED ORDER — ETOMIDATE 2 MG/ML IV SOLN
INTRAVENOUS | Status: AC | PRN
  Administered 2018-12-19: 20 mg via INTRAVENOUS

## 2018-12-19 MED ORDER — IOHEXOL 300 MG/ML  SOLN
100.0000 mL | Freq: Once | INTRAMUSCULAR | Status: AC | PRN
  Administered 2018-12-19: 100 mL via INTRAVENOUS

## 2018-12-19 MED ORDER — ONDANSETRON HCL 4 MG/2ML IJ SOLN
4.0000 mg | Freq: Four times a day (QID) | INTRAMUSCULAR | Status: DC | PRN
  Administered 2019-02-03 – 2019-02-05 (×2): 4 mg via INTRAVENOUS
  Filled 2018-12-19 (×2): qty 2

## 2018-12-19 MED ORDER — PROPOFOL 1000 MG/100ML IV EMUL: 5.0000 ug/kg/min | INTRAVENOUS | Status: DC
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
## 2018-12-19 NOTE — Progress Notes (Signed)
Orthopedic Tech Progress Note Patient Details:  Hunter Cook 05-23-1996 [id number]  Ortho Devices Type of Ortho Device: Sugartong splint Ortho Device/Splint Interventions: Ordered, Application, Adjustment   Post Interventions Patient Tolerated: Well Instructions Provided: Adjustment of device, Care of device   Norva Karvonen T 12/19/2018, 10:48 PM
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
## 2018-12-20 ENCOUNTER — Inpatient Hospital Stay (HOSPITAL_COMMUNITY): Payer: Medicaid Other

## 2018-12-20 ENCOUNTER — Encounter (HOSPITAL_COMMUNITY): Payer: Self-pay | Admitting: *Deleted

## 2018-12-20 ENCOUNTER — Encounter (HOSPITAL_COMMUNITY): Admission: EM | Disposition: A | Discharge status: Rehabilitation Facility | Payer: Self-pay | Source: Home / Self Care

## 2018-12-20 ENCOUNTER — Inpatient Hospital Stay (HOSPITAL_COMMUNITY): Payer: Medicaid Other | Admitting: Anesthesiology

## 2018-12-20 HISTORY — PX: LAPAROTOMY: SHX154

## 2018-12-20 LAB — PREPARE FRESH FROZEN PLASMA
UNIT DIVISION: 0
Unit division: 0

## 2018-12-20 LAB — COMPREHENSIVE METABOLIC PANEL
ALK PHOS: 32 U/L — AB (ref 38–126)
ALT: 69 U/L — ABNORMAL HIGH (ref 0–44)
ANION GAP: 4 — AB (ref 5–15)
AST: 156 U/L — ABNORMAL HIGH (ref 15–41)
Albumin: 2.8 g/dL — ABNORMAL LOW (ref 3.5–5.0)
BUN: 8 mg/dL (ref 6–20)
CO2: 26 mmol/L (ref 22–32)
Calcium: 7 mg/dL — ABNORMAL LOW (ref 8.9–10.3)
Chloride: 111 mmol/L (ref 98–111)
Creatinine, Ser: 0.93 mg/dL (ref 0.61–1.24)
GFR calc Af Amer: >60 mL/min (ref >60)
GFR calc non Af Amer: >60 mL/min (ref >60)
GLUCOSE: 130 mg/dL — AB (ref 70–99)
Potassium: 3.6 mmol/L (ref 3.5–5.1)
Sodium: 141 mmol/L (ref 135–145)
Total Bilirubin: 0.7 mg/dL (ref 0.3–1.2)
Total Protein: 4.6 g/dL — ABNORMAL LOW (ref 6.5–8.1)

## 2018-12-20 LAB — POCT I-STAT 7, (LYTES, BLD GAS, ICA,H+H)
ACID-BASE DEFICIT: 13 mmol/L — AB (ref 0.0–2.0)
Acid-base deficit: 4 mmol/L — ABNORMAL HIGH (ref 0.0–2.0)
Bicarbonate: 15 mmol/L — ABNORMAL LOW (ref 20.0–28.0)
Bicarbonate: 22.7 mmol/L (ref 20.0–28.0)
Calcium, Ion: 0.95 mmol/L — ABNORMAL LOW (ref 1.15–1.40)
Calcium, Ion: 1.06 mmol/L — ABNORMAL LOW (ref 1.15–1.40)
HCT: 34 % — ABNORMAL LOW (ref 39.0–52.0)
HCT: 31 % — ABNORMAL LOW (ref 39.0–52.0)
Hemoglobin: 11.6 g/dL — ABNORMAL LOW (ref 13.0–17.0)
Hemoglobin: 10.5 g/dL — ABNORMAL LOW (ref 13.0–17.0)
O2 Saturation: 100 %
O2 Saturation: 95 %
Patient temperature: 36.2
Patient temperature: 37.2
Potassium: 4.2 mmol/L (ref 3.5–5.1)
Potassium: 4.3 mmol/L (ref 3.5–5.1)
Sodium: 142 mmol/L (ref 135–145)
Sodium: 141 mmol/L (ref 135–145)
TCO2: 16 mmol/L — ABNORMAL LOW (ref 22–32)
TCO2: 24 mmol/L (ref 22–32)
pCO2 arterial: 41.7 mmHg (ref 32.0–48.0)
pCO2 arterial: 46 mmHg (ref 32.0–48.0)
pH, Arterial: 7.16 — CL (ref 7.350–7.450)
pH, Arterial: 7.303 — ABNORMAL LOW (ref 7.350–7.450)
pO2, Arterial: 216 mmHg — ABNORMAL HIGH (ref 83.0–108.0)
pO2, Arterial: 86 mmHg (ref 83.0–108.0)

## 2018-12-20 LAB — BPAM FFP
Blood Product Expiration Date: 202002142359
Blood Product Expiration Date: 202002142359
ISSUE DATE / TIME: 202001242112
ISSUE DATE / TIME: 202001242112
UNIT TYPE AND RH: 6200
Unit Type and Rh: 6200

## 2018-12-20 LAB — URINALYSIS, ROUTINE W REFLEX MICROSCOPIC
Bilirubin Urine: NEGATIVE
Glucose, UA: NEGATIVE mg/dL
Ketones, ur: NEGATIVE mg/dL
Leukocytes, UA: NEGATIVE
Nitrite: NEGATIVE
Protein, ur: 30 mg/dL — AB
Specific Gravity, Urine: 1.027 (ref 1.005–1.030)
WBC, UA: >50 WBC/hpf — ABNORMAL HIGH (ref 0–5)
pH: 5 (ref 5.0–8.0)

## 2018-12-20 LAB — BASIC METABOLIC PANEL
Anion gap: 9 (ref 5–15)
BUN: 8 mg/dL (ref 6–20)
CHLORIDE: 113 mmol/L — AB (ref 98–111)
CO2: 19 mmol/L — ABNORMAL LOW (ref 22–32)
Calcium: 7.4 mg/dL — ABNORMAL LOW (ref 8.9–10.3)
Creatinine, Ser: 1.08 mg/dL (ref 0.61–1.24)
GFR calc Af Amer: >60 mL/min (ref >60)
GFR calc non Af Amer: >60 mL/min (ref >60)
Glucose, Bld: 135 mg/dL — ABNORMAL HIGH (ref 70–99)
Potassium: 4.2 mmol/L (ref 3.5–5.1)
Sodium: 141 mmol/L (ref 135–145)

## 2018-12-20 LAB — CBC
HCT: 42.4 % (ref 39.0–52.0)
HCT: 38.2 % — ABNORMAL LOW (ref 39.0–52.0)
Hemoglobin: 14.1 g/dL (ref 13.0–17.0)
Hemoglobin: 13.2 g/dL (ref 13.0–17.0)
MCH: 30.9 pg (ref 26.0–34.0)
MCH: 31.5 pg (ref 26.0–34.0)
MCHC: 33.3 g/dL (ref 30.0–36.0)
MCHC: 34.6 g/dL (ref 30.0–36.0)
MCV: 92.8 fL (ref 80.0–100.0)
MCV: 91.2 fL (ref 80.0–100.0)
Platelets: 273 10*3/uL (ref 150–400)
Platelets: 185 10*3/uL (ref 150–400)
RBC: 4.57 MIL/uL (ref 4.22–5.81)
RBC: 4.19 MIL/uL — ABNORMAL LOW (ref 4.22–5.81)
RDW: 13.4 % (ref 11.5–15.5)
RDW: 13.4 % (ref 11.5–15.5)
WBC: 14.7 10*3/uL — ABNORMAL HIGH (ref 4.0–10.5)
WBC: 27.2 10*3/uL — AB (ref 4.0–10.5)
nRBC: 0.1 % (ref 0.0–0.2)
nRBC: 0 % (ref 0.0–0.2)

## 2018-12-20 LAB — TRIGLYCERIDES: Triglycerides: 328 mg/dL — ABNORMAL HIGH (ref <150)

## 2018-12-20 LAB — MRSA PCR SCREENING: MRSA by PCR: NEGATIVE

## 2018-12-20 LAB — HEMOGLOBIN AND HEMATOCRIT, BLOOD
HCT: 35.1 % — ABNORMAL LOW (ref 39.0–52.0)
HEMATOCRIT: 39 % (ref 39.0–52.0)
Hemoglobin: 11.8 g/dL — ABNORMAL LOW (ref 13.0–17.0)
Hemoglobin: 12.5 g/dL — ABNORMAL LOW (ref 13.0–17.0)

## 2018-12-20 LAB — PREPARE RBC (CROSSMATCH)

## 2018-12-20 LAB — LACTIC ACID, PLASMA: LACTIC ACID, VENOUS: 2.1 mmol/L — AB (ref 0.5–1.9)

## 2018-12-20 LAB — HIV ANTIBODY (ROUTINE TESTING W REFLEX): HIV Screen 4th Generation wRfx: NONREACTIVE

## 2018-12-20 SURGERY — LAPAROTOMY, EXPLORATORY
Anesthesia: General | Site: Abdomen

## 2018-12-20 MED ORDER — SODIUM CHLORIDE 0.9 % IV SOLN
10.0000 mL/h | Freq: Once | INTRAVENOUS | Status: AC
  Administered 2018-12-24 – 2018-12-25 (×2): 10 mL/h via INTRAVENOUS

## 2018-12-20 MED ORDER — ALBUMIN HUMAN 5 % IV SOLN
12.5000 g | Freq: Once | INTRAVENOUS | Status: AC
  Administered 2018-12-20: 12.5 g via INTRAVENOUS

## 2018-12-20 MED ORDER — SODIUM CHLORIDE 0.9 % IV SOLN
INTRAVENOUS | Status: DC | PRN
  Administered 2018-12-20: 25 ug/min via INTRAVENOUS

## 2018-12-20 MED ORDER — LEVETIRACETAM IN NACL 500 MG/100ML IV SOLN
500.0000 mg | Freq: Two times a day (BID) | INTRAVENOUS | Status: AC
  Administered 2018-12-20 – 2018-12-26 (×13): 500 mg via INTRAVENOUS
  Filled 2018-12-20 (×13): qty 100

## 2018-12-20 MED ORDER — CALCIUM CHLORIDE 10 % IV SOLN
INTRAVENOUS | Status: DC | PRN
  Administered 2018-12-20: 500 mg via INTRAVENOUS

## 2018-12-20 MED ORDER — SODIUM BICARBONATE 8.4 % IV SOLN
INTRAVENOUS | Status: AC
  Filled 2018-12-20: qty 50

## 2018-12-20 MED ORDER — ROCURONIUM BROMIDE 50 MG/5ML IV SOSY
PREFILLED_SYRINGE | INTRAVENOUS | Status: AC
  Filled 2018-12-20: qty 20

## 2018-12-20 MED ORDER — CALCIUM CHLORIDE 10 % IV SOLN
INTRAVENOUS | Status: AC
  Filled 2018-12-20: qty 10

## 2018-12-20 MED ORDER — ORAL CARE MOUTH RINSE
15.0000 mL | OROMUCOSAL | Status: DC
  Administered 2018-12-20 – 2019-01-28 (×386): 15 mL via OROMUCOSAL

## 2018-12-20 MED ORDER — FENTANYL CITRATE (PF) 100 MCG/2ML IJ SOLN
50.0000 ug | Freq: Once | INTRAMUSCULAR | Status: AC
  Administered 2018-12-20: 50 ug via INTRAVENOUS

## 2018-12-20 MED ORDER — 0.9 % SODIUM CHLORIDE (POUR BTL) OPTIME
TOPICAL | Status: DC | PRN
  Administered 2018-12-20 (×4): 1000 mL

## 2018-12-20 MED ORDER — HYDROMORPHONE HCL 1 MG/ML IJ SOLN
1.0000 mg | INTRAMUSCULAR | Status: DC | PRN
  Administered 2018-12-20: 1 mg via INTRAVENOUS
  Administered 2018-12-20 – 2018-12-22 (×9): 2 mg via INTRAVENOUS
  Administered 2018-12-26: 1 mg via INTRAVENOUS
  Administered 2018-12-27 – 2019-01-05 (×5): 2 mg via INTRAVENOUS
  Administered 2019-01-06: 1 mg via INTRAVENOUS
  Administered 2019-01-06 – 2019-01-12 (×5): 2 mg via INTRAVENOUS
  Administered 2019-01-13: 1 mg via INTRAVENOUS
  Administered 2019-01-13 – 2019-01-23 (×15): 2 mg via INTRAVENOUS
  Administered 2019-01-23: 1 mg via INTRAVENOUS
  Administered 2019-01-24: 2 mg via INTRAVENOUS
  Administered 2019-01-24: 1 mg via INTRAVENOUS
  Administered 2019-01-24 – 2019-02-04 (×27): 2 mg via INTRAVENOUS
  Administered 2019-02-04 – 2019-02-09 (×2): 1 mg via INTRAVENOUS
  Filled 2018-12-20 (×20): qty 2
  Filled 2018-12-20: qty 1
  Filled 2018-12-20 (×31): qty 2
  Filled 2018-12-20: qty 1
  Filled 2018-12-20 (×18): qty 2
  Filled 2018-12-20: qty 1
  Filled 2018-12-20 (×2): qty 2
  Filled 2018-12-20: qty 1
  Filled 2018-12-20: qty 2

## 2018-12-20 MED ORDER — SODIUM CHLORIDE 0.9 % IV SOLN
2.0000 g | INTRAVENOUS | Status: AC
  Administered 2018-12-20: 2 g via INTRAVENOUS
  Filled 2018-12-20: qty 2

## 2018-12-20 MED ORDER — FENTANYL CITRATE (PF) 250 MCG/5ML IJ SOLN
INTRAMUSCULAR | Status: DC | PRN
  Administered 2018-12-20: 50 ug via INTRAVENOUS
  Administered 2018-12-20: 100 ug via INTRAVENOUS
  Administered 2018-12-20 (×2): 50 ug via INTRAVENOUS

## 2018-12-20 MED ORDER — FENTANYL CITRATE (PF) 250 MCG/5ML IJ SOLN
INTRAMUSCULAR | Status: AC
  Filled 2018-12-20: qty 5

## 2018-12-20 MED ORDER — DEXTROSE-NACL 5-0.9 % IV SOLN
INTRAVENOUS | Status: DC
  Administered 2018-12-20 – 2018-12-22 (×5): via INTRAVENOUS

## 2018-12-20 MED ORDER — HEMOSTATIC AGENTS (NO CHARGE) OPTIME
TOPICAL | Status: DC | PRN
  Administered 2018-12-20 (×3): 1 via TOPICAL

## 2018-12-20 MED ORDER — PHENYLEPHRINE 40 MCG/ML (10ML) SYRINGE FOR IV PUSH (FOR BLOOD PRESSURE SUPPORT)
PREFILLED_SYRINGE | INTRAVENOUS | Status: DC | PRN
  Administered 2018-12-20: 80 ug via INTRAVENOUS

## 2018-12-20 MED ORDER — SODIUM BICARBONATE 8.4 % IV SOLN
INTRAVENOUS | Status: DC | PRN
  Administered 2018-12-20: 50 meq via INTRAVENOUS

## 2018-12-20 MED ORDER — LACTATED RINGERS IV SOLN
INTRAVENOUS | Status: DC | PRN
  Administered 2018-12-20: 02:00:00 via INTRAVENOUS

## 2018-12-20 MED ORDER — ROCURONIUM BROMIDE 10 MG/ML (PF) SYRINGE
PREFILLED_SYRINGE | INTRAVENOUS | Status: DC | PRN
  Administered 2018-12-20: 20 mg via INTRAVENOUS
  Administered 2018-12-20: 50 mg via INTRAVENOUS
  Administered 2018-12-20: 80 mg via INTRAVENOUS

## 2018-12-20 MED ORDER — PROPOFOL 1000 MG/100ML IV EMUL
INTRAVENOUS | Status: AC
  Filled 2018-12-20: qty 100

## 2018-12-20 MED ORDER — HYDROMORPHONE HCL 1 MG/ML IJ SOLN
INTRAMUSCULAR | Status: AC
  Administered 2018-12-20: 1 mg via INTRAVENOUS
  Filled 2018-12-20: qty 1

## 2018-12-20 MED ORDER — SODIUM CHLORIDE 0.9 % IV BOLUS
1000.0000 mL | Freq: Once | INTRAVENOUS | Status: AC
  Administered 2018-12-20: 1000 mL via INTRAVENOUS

## 2018-12-20 MED ORDER — LACTATED RINGERS IV SOLN
INTRAVENOUS | Status: DC | PRN
  Administered 2018-12-20 (×2): via INTRAVENOUS

## 2018-12-20 MED ORDER — CHLORHEXIDINE GLUCONATE 0.12% ORAL RINSE (MEDLINE KIT)
15.0000 mL | Freq: Two times a day (BID) | OROMUCOSAL | Status: DC
  Administered 2018-12-20 – 2019-02-09 (×102): 15 mL via OROMUCOSAL

## 2018-12-20 MED ORDER — ALBUMIN HUMAN 5 % IV SOLN
INTRAVENOUS | Status: DC | PRN
  Administered 2018-12-20 (×2): via INTRAVENOUS

## 2018-12-20 MED ORDER — ALBUMIN HUMAN 5 % IV SOLN
INTRAVENOUS | Status: AC
  Filled 2018-12-20: qty 500

## 2018-12-20 SURGICAL SUPPLY — 49 items
BLADE CLIPPER SURG (BLADE) ×6 IMPLANT
CANISTER SUCT 3000ML PPV (MISCELLANEOUS) IMPLANT
CHLORAPREP W/TINT 26ML (MISCELLANEOUS) ×3 IMPLANT
COVER SURGICAL LIGHT HANDLE (MISCELLANEOUS) ×3 IMPLANT
DRAIN CHANNEL 19F RND (DRAIN) ×3 IMPLANT
DRAPE HALF SHEET 40X57 (DRAPES) ×3 IMPLANT
DRAPE LAPAROSCOPIC ABDOMINAL (DRAPES) ×3 IMPLANT
DRAPE WARM FLUID 44X44 (DRAPE) ×3 IMPLANT
DRSG OPSITE POSTOP 4X10 (GAUZE/BANDAGES/DRESSINGS) ×3 IMPLANT
DRSG OPSITE POSTOP 4X8 (GAUZE/BANDAGES/DRESSINGS) IMPLANT
ELECT BLADE 6.5 EXT (BLADE) ×3 IMPLANT
ELECT CAUTERY BLADE 6.4 (BLADE) ×3 IMPLANT
ELECT REM PT RETURN 9FT ADLT (ELECTROSURGICAL) ×3
ELECTRODE REM PT RTRN 9FT ADLT (ELECTROSURGICAL) ×1 IMPLANT
EVACUATOR SILICONE 100CC (DRAIN) ×3 IMPLANT
GLOVE BIO SURGEON STRL SZ7.5 (GLOVE) ×3 IMPLANT
GLOVE BIOGEL PI IND STRL 8 (GLOVE) ×1 IMPLANT
GLOVE BIOGEL PI INDICATOR 8 (GLOVE) ×2
GLOVE SURG SYN 7.5  E (GLOVE) ×2
GLOVE SURG SYN 7.5 E (GLOVE) ×1 IMPLANT
GOWN PREVENTION PLUS LG XLONG (DISPOSABLE) ×3 IMPLANT
GOWN STRL REUS W/ TWL LRG LVL3 (GOWN DISPOSABLE) IMPLANT
GOWN STRL REUS W/ TWL XL LVL3 (GOWN DISPOSABLE) ×1 IMPLANT
GOWN STRL REUS W/TWL LRG LVL3 (GOWN DISPOSABLE)
GOWN STRL REUS W/TWL XL LVL3 (GOWN DISPOSABLE) ×2
KIT BASIN OR (CUSTOM PROCEDURE TRAY) ×3 IMPLANT
KIT TURNOVER KIT B (KITS) ×3 IMPLANT
LIGASURE IMPACT 36 18CM CVD LR (INSTRUMENTS) ×3 IMPLANT
MANIFOLD NEPTUNE WASTE (CANNULA) ×3 IMPLANT
NS IRRIG 1000ML POUR BTL (IV SOLUTION) ×6 IMPLANT
PACK GENERAL/GYN (CUSTOM PROCEDURE TRAY) ×3 IMPLANT
PAD ARMBOARD 7.5X6 YLW CONV (MISCELLANEOUS) ×3 IMPLANT
PENCIL SMOKE EVACUATOR (MISCELLANEOUS) ×3 IMPLANT
SLEEVE SUCTION CATH 165 (SLEEVE) ×3 IMPLANT
SPECIMEN JAR LARGE (MISCELLANEOUS) ×3 IMPLANT
SPONGE LAP 18X18 X RAY DECT (DISPOSABLE) ×18 IMPLANT
STAPLER VISISTAT 35W (STAPLE) ×3 IMPLANT
SUCTION POOLE TIP (SUCTIONS) ×3 IMPLANT
SUT ETHILON 2 0 FS 18 (SUTURE) ×3 IMPLANT
SUT PDS AB 1 TP1 96 (SUTURE) ×6 IMPLANT
SUT SILK 2 0 SH CR/8 (SUTURE) ×3 IMPLANT
SUT SILK 2 0 TIES 10X30 (SUTURE) ×3 IMPLANT
SUT SILK 3 0 SH CR/8 (SUTURE) ×3 IMPLANT
SUT SILK 3 0 TIES 10X30 (SUTURE) ×3 IMPLANT
SYR TOOMEY 50ML (SYRINGE) ×3 IMPLANT
TOWEL OR 17X24 6PK STRL BLUE (TOWEL DISPOSABLE) ×3 IMPLANT
TOWEL OR 17X26 10 PK STRL BLUE (TOWEL DISPOSABLE) ×3 IMPLANT
TRAY FOLEY MTR SLVR 16FR STAT (SET/KITS/TRAYS/PACK) IMPLANT
YANKAUER SUCT BULB TIP NO VENT (SUCTIONS) IMPLANT

## 2018-12-20 NOTE — OR Nursing (Signed)
Irrigated Foley catheter with 60 ml sterile sodium chloride per Dr. Derrell Lollingamirez. No change in urine output.Remains blood tinged.

## 2018-12-20 NOTE — Progress Notes (Signed)
Patient ID: Hunter Cook, male   DOB: 05-08-1996, 23 y.o.   MRN: [id number] Pt with episodes of hypotension. Will proceed to OR for Ex Lap, likely splenectomy emergently. D/w in detail with Pt's father.
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
## 2018-12-20 NOTE — ED Notes (Signed)
Pt intubated with a 7.57mm ETT. Same secured at 63mm at the teeth, with positive color change and = bilateral breath sounds.
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
## 2018-12-20 NOTE — Progress Notes (Signed)
Orthopedic Tech Progress Note Patient Details:  Hunter Cook 1996-03-29 [id number]  Ortho Devices Type of Ortho Device: Long arm splint Ortho Device/Splint Interventions: Application, Ordered   Post Interventions Patient Tolerated: Well Instructions Provided: Care of device, Adjustment of device   Norva Karvonen T 12/20/2018, 6:54 AM
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
## 2018-12-20 NOTE — Transfer of Care (Signed)
Immediate Anesthesia Transfer of Care Note  Patient: Hunter Cook  Procedure(s) Performed: EXPLORATORY LAPAROTOMY; SPLEENECTOMY (N/A Abdomen)  Patient Location: NICU  Anesthesia Type:General  Level of Consciousness: Patient remains intubated per anesthesia plan  Airway & Oxygen Therapy: Patient placed on Ventilator (see vital sign flow sheet for setting)  Post-op Assessment: Report given to RN and Post -op Vital signs reviewed and stable  Post vital signs: Reviewed and stable  Last Vitals:  Vitals Value Taken Time  BP    Temp    Pulse    Resp    SpO2      Last Pain:  Vitals:   12/19/18 2130  TempSrc: Temporal         Complications: No apparent anesthesia complications

## 2018-12-20 NOTE — Progress Notes (Signed)
Report given to Unit RRT.

## 2018-12-20 NOTE — Progress Notes (Signed)
Patient placed back on CPAP/PS mode at this time.

## 2018-12-20 NOTE — Anesthesia Preprocedure Evaluation (Addendum)
Anesthesia Evaluation  Patient identified by MRN, date of birth, ID band Patient unresponsive    Reviewed: Allergy & Precautions, H&P , NPO status , Patient's Chart, lab work & pertinent test resultsPreop documentation limited or incomplete due to emergent nature of procedure.  Airway Mallampati: Intubated       Dental no notable dental hx. (+) Teeth Intact, Dental Advisory Given   Pulmonary neg pulmonary ROS,    Pulmonary exam normal breath sounds clear to auscultation       Cardiovascular negative cardio ROS   Rhythm:Regular Rate:Tachycardia     Neuro/Psych negative neurological ROS  negative psych ROS   GI/Hepatic negative GI ROS, Neg liver ROS,   Endo/Other  negative endocrine ROS  Renal/GU negative Renal ROS  negative genitourinary   Musculoskeletal   Abdominal   Peds  Hematology negative hematology ROS (+)   Anesthesia Other Findings   Reproductive/Obstetrics negative OB ROS                            Anesthesia Physical Anesthesia Plan  ASA: III and emergent  Anesthesia Plan: General   Post-op Pain Management:    Induction: Intravenous  PONV Risk Score and Plan: 2 and Treatment may vary due to age or medical condition  Airway Management Planned: Oral ETT  Additional Equipment: Arterial line  Intra-op Plan:   Post-operative Plan: Post-operative intubation/ventilation  Informed Consent: I have reviewed the patients History and Physical, chart, labs and discussed the procedure including the risks, benefits and alternatives for the proposed anesthesia with the patient or authorized representative who has indicated his/her understanding and acceptance.     Dental advisory given  Plan Discussed with: CRNA  Anesthesia Plan Comments:         Anesthesia Quick Evaluation
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
## 2018-12-20 NOTE — Anesthesia Procedure Notes (Addendum)
Arterial Line Insertion Start/End1/25/2020 1:25 AM, 12/20/2018 1:35 AM Performed by: Molli Hazard, CRNA, CRNA  Patient location: OR. Right, radial was placed Catheter size: 20 G Hand hygiene performed  and maximum sterile barriers used   Attempts: 1 Procedure performed without using ultrasound guided technique. Following insertion, dressing applied and Biopatch. Post procedure assessment: normal

## 2018-12-20 NOTE — Anesthesia Postprocedure Evaluation (Signed)
Anesthesia Post Note  Patient: Hunter Cook  Procedure(s) Performed: EXPLORATORY LAPAROTOMY; SPLEENECTOMY (N/A Abdomen)     Patient location during evaluation: SICU Anesthesia Type: General Level of consciousness: sedated Pain management: pain level controlled Vital Signs Assessment: post-procedure vital signs reviewed and stable Respiratory status: patient remains intubated per anesthesia plan Cardiovascular status: stable Postop Assessment: no apparent nausea or vomiting Anesthetic complications: no    Last Vitals:  Vitals:   12/20/18 0800 12/20/18 0832  BP: (!) 145/84   Pulse: (!) 121 (!) 118  Resp: 10 16  Temp: (!) 38.1 C   SpO2: 98% 99%    Last Pain:  Vitals:   12/19/18 2130  TempSrc: Temporal                 Zaniel Marineau,W. EDMOND

## 2018-12-20 NOTE — Progress Notes (Signed)
Patient transported to CT and back to 4N22 with no complications. RT will continue to monitor.

## 2018-12-20 NOTE — Progress Notes (Signed)
Patient removed from CPAP/PS mode to be transported to CT.

## 2018-12-20 NOTE — Progress Notes (Signed)
Patient transferred from ER to 4N22 w/o complications.100% FiO2 w/ transport. Uneventful trip.

## 2018-12-21 ENCOUNTER — Inpatient Hospital Stay (HOSPITAL_COMMUNITY): Payer: Medicaid Other

## 2018-12-21 LAB — CBC
HCT: 31.9 % — ABNORMAL LOW (ref 39.0–52.0)
Hemoglobin: 10.6 g/dL — ABNORMAL LOW (ref 13.0–17.0)
MCH: 30.3 pg (ref 26.0–34.0)
MCHC: 33.2 g/dL (ref 30.0–36.0)
MCV: 91.1 fL (ref 80.0–100.0)
Platelets: 160 10*3/uL (ref 150–400)
RBC: 3.5 MIL/uL — ABNORMAL LOW (ref 4.22–5.81)
RDW: 14 % (ref 11.5–15.5)
WBC: 14.8 10*3/uL — ABNORMAL HIGH (ref 4.0–10.5)
nRBC: 0 % (ref 0.0–0.2)

## 2018-12-21 LAB — BASIC METABOLIC PANEL
Anion gap: 5 (ref 5–15)
BUN: <5 mg/dL — ABNORMAL LOW (ref 6–20)
CO2: 25 mmol/L (ref 22–32)
Calcium: 7.1 mg/dL — ABNORMAL LOW (ref 8.9–10.3)
Chloride: 109 mmol/L (ref 98–111)
Creatinine, Ser: 0.74 mg/dL (ref 0.61–1.24)
GFR calc Af Amer: >60 mL/min (ref >60)
GFR calc non Af Amer: >60 mL/min (ref >60)
Glucose, Bld: 118 mg/dL — ABNORMAL HIGH (ref 70–99)
Potassium: 4.2 mmol/L (ref 3.5–5.1)
SODIUM: 139 mmol/L (ref 135–145)

## 2018-12-21 LAB — HEMOGLOBIN AND HEMATOCRIT, BLOOD
HCT: 34 % — ABNORMAL LOW (ref 39.0–52.0)
Hemoglobin: 11.8 g/dL — ABNORMAL LOW (ref 13.0–17.0)

## 2018-12-21 MED ORDER — FENTANYL 2500MCG IN NS 250ML (10MCG/ML) PREMIX INFUSION
0.0000 ug/h | INTRAVENOUS | Status: DC
  Administered 2018-12-21: 100 ug/h via INTRAVENOUS
  Administered 2018-12-23: 150 ug/h via INTRAVENOUS
  Administered 2018-12-24: 200 ug/h via INTRAVENOUS
  Administered 2018-12-24: 175 ug/h via INTRAVENOUS
  Administered 2018-12-25 (×3): 200 ug/h via INTRAVENOUS
  Administered 2018-12-26: 250 ug/h via INTRAVENOUS
  Administered 2018-12-26: 150 ug/h via INTRAVENOUS
  Administered 2018-12-27: 100 ug/h via INTRAVENOUS
  Administered 2018-12-27: 400 ug/h via INTRAVENOUS
  Administered 2018-12-27 – 2018-12-28 (×4): 250 ug/h via INTRAVENOUS
  Administered 2018-12-29: 150 ug/h via INTRAVENOUS
  Administered 2018-12-29: 200 ug/h via INTRAVENOUS
  Administered 2018-12-30 – 2018-12-31 (×6): 300 ug/h via INTRAVENOUS
  Administered 2019-01-01: 250 ug/h via INTRAVENOUS
  Administered 2019-01-01 – 2019-01-02 (×5): 400 ug/h via INTRAVENOUS
  Administered 2019-01-02: 300 ug/h via INTRAVENOUS
  Administered 2019-01-02 – 2019-01-05 (×14): 400 ug/h via INTRAVENOUS
  Administered 2019-01-06 (×2): 300 ug/h via INTRAVENOUS
  Administered 2019-01-07: 350 ug/h via INTRAVENOUS
  Administered 2019-01-07: 300 ug/h via INTRAVENOUS
  Administered 2019-01-07: 350 ug/h via INTRAVENOUS
  Administered 2019-01-08: 400 ug/h via INTRAVENOUS
  Administered 2019-01-08 – 2019-01-09 (×4): 350 ug/h via INTRAVENOUS
  Administered 2019-01-09: 300 ug/h via INTRAVENOUS
  Administered 2019-01-09 – 2019-01-10 (×2): 350 ug/h via INTRAVENOUS
  Administered 2019-01-10: 325 ug/h via INTRAVENOUS
  Administered 2019-01-10: 400 ug/h via INTRAVENOUS
  Administered 2019-01-11: 300 ug/h via INTRAVENOUS
  Administered 2019-01-11: 325 ug/h via INTRAVENOUS
  Administered 2019-01-11: 350 ug/h via INTRAVENOUS
  Administered 2019-01-12 – 2019-01-13 (×4): 300 ug/h via INTRAVENOUS
  Administered 2019-01-13: 400 ug/h via INTRAVENOUS
  Administered 2019-01-13: 300 ug/h via INTRAVENOUS
  Administered 2019-01-13: 400 ug/h via INTRAVENOUS
  Administered 2019-01-14 – 2019-01-15 (×6): 350 ug/h via INTRAVENOUS
  Administered 2019-01-15 – 2019-01-16 (×2): 325 ug/h via INTRAVENOUS
  Administered 2019-01-16: 350 ug/h via INTRAVENOUS
  Administered 2019-01-16: 325 ug/h via INTRAVENOUS
  Administered 2019-01-17 (×2): 350 ug/h via INTRAVENOUS
  Administered 2019-01-17: 325 ug/h via INTRAVENOUS
  Administered 2019-01-18: 350 ug/h via INTRAVENOUS
  Administered 2019-01-18: 325 ug/h via INTRAVENOUS
  Administered 2019-01-18: 350 ug/h via INTRAVENOUS
  Administered 2019-01-19: 300 ug/h via INTRAVENOUS
  Administered 2019-01-19 – 2019-01-20 (×3): 350 ug/h via INTRAVENOUS
  Administered 2019-01-20 (×2): 325 ug/h via INTRAVENOUS
  Administered 2019-01-20: 350 ug/h via INTRAVENOUS
  Administered 2019-01-21 (×3): 325 ug/h via INTRAVENOUS
  Administered 2019-01-22: 400 ug/h via INTRAVENOUS
  Administered 2019-01-22: 325 ug/h via INTRAVENOUS
  Administered 2019-01-22 (×2): 400 ug/h via INTRAVENOUS
  Administered 2019-01-23: 275 ug/h via INTRAVENOUS
  Administered 2019-01-23: 325 ug/h via INTRAVENOUS
  Administered 2019-01-23: 300 ug/h via INTRAVENOUS
  Administered 2019-01-24: 325 ug/h via INTRAVENOUS
  Administered 2019-01-24 (×2): 300 ug/h via INTRAVENOUS
  Administered 2019-01-25: 400 ug/h via INTRAVENOUS
  Administered 2019-01-25: 300 ug/h via INTRAVENOUS
  Administered 2019-01-25 – 2019-01-26 (×3): 325 ug/h via INTRAVENOUS
  Administered 2019-01-26: 300 ug/h via INTRAVENOUS
  Administered 2019-01-27: 200 ug/h via INTRAVENOUS
  Administered 2019-01-27: 300 ug/h via INTRAVENOUS
  Administered 2019-01-27: 325 ug/h via INTRAVENOUS
  Administered 2019-01-27: 400 ug/h via INTRAVENOUS
  Administered 2019-01-28: 125 ug/h via INTRAVENOUS
  Administered 2019-01-28: 300 ug/h via INTRAVENOUS
  Administered 2019-01-29 (×2): 350 ug/h via INTRAVENOUS
  Administered 2019-01-29: 200 ug/h via INTRAVENOUS
  Administered 2019-01-30 (×2): 250 ug/h via INTRAVENOUS
  Administered 2019-01-30: 400 ug/h via INTRAVENOUS
  Administered 2019-01-31 – 2019-02-02 (×5): 200 ug/h via INTRAVENOUS
  Filled 2018-12-21 (×135): qty 250

## 2018-12-21 MED ORDER — ACETAMINOPHEN 650 MG RE SUPP
650.0000 mg | Freq: Four times a day (QID) | RECTAL | Status: DC | PRN
  Administered 2019-02-05: 650 mg via RECTAL
  Filled 2018-12-21: qty 1

## 2018-12-21 MED ORDER — ACETAMINOPHEN 160 MG/5ML PO SOLN
650.0000 mg | Freq: Four times a day (QID) | ORAL | Status: DC | PRN
  Administered 2018-12-21 – 2019-02-09 (×33): 650 mg
  Filled 2018-12-21 (×35): qty 20.3

## 2018-12-21 MED ORDER — PANTOPRAZOLE SODIUM 40 MG IV SOLR
40.0000 mg | INTRAVENOUS | Status: DC
  Administered 2018-12-21 – 2019-01-24 (×35): 40 mg via INTRAVENOUS
  Filled 2018-12-21 (×36): qty 40

## 2018-12-21 MED ORDER — MIDAZOLAM 50MG/50ML (1MG/ML) PREMIX INFUSION
0.0000 mg/h | INTRAVENOUS | Status: DC
  Administered 2018-12-21: 5 mg/h via INTRAVENOUS
  Administered 2018-12-22: 3 mg/h via INTRAVENOUS
  Administered 2018-12-22: 5 mg/h via INTRAVENOUS
  Administered 2018-12-24: 1 mg/h via INTRAVENOUS
  Administered 2018-12-25 – 2018-12-26 (×3): 2 mg/h via INTRAVENOUS
  Administered 2018-12-27: 3 mg/h via INTRAVENOUS
  Administered 2018-12-27: 6 mg/h via INTRAVENOUS
  Administered 2018-12-27: 2 mg/h via INTRAVENOUS
  Administered 2018-12-28: 8 mg/h via INTRAVENOUS
  Administered 2018-12-28: 6 mg/h via INTRAVENOUS
  Administered 2018-12-29: 4 mg/h via INTRAVENOUS
  Administered 2018-12-29: 6 mg/h via INTRAVENOUS
  Administered 2018-12-29: 2 mg/h via INTRAVENOUS
  Administered 2018-12-30: 1 mg/h via INTRAVENOUS
  Administered 2018-12-31: 4 mg/h via INTRAVENOUS
  Administered 2018-12-31: 7 mg/h via INTRAVENOUS
  Administered 2018-12-31 – 2019-01-01 (×2): 4 mg/h via INTRAVENOUS
  Administered 2019-01-01: 2 mg/h via INTRAVENOUS
  Administered 2019-01-02 – 2019-01-03 (×3): 4 mg/h via INTRAVENOUS
  Administered 2019-01-03: 5 mg/h via INTRAVENOUS
  Administered 2019-01-03: 4 mg/h via INTRAVENOUS
  Administered 2019-01-04 – 2019-01-05 (×4): 5.5 mg/h via INTRAVENOUS
  Administered 2019-01-05: 6 mg/h via INTRAVENOUS
  Administered 2019-01-05: 5 mg/h via INTRAVENOUS
  Administered 2019-01-06: 4 mg/h via INTRAVENOUS
  Administered 2019-01-06 (×2): 6 mg/h via INTRAVENOUS
  Administered 2019-01-07: 5 mg/h via INTRAVENOUS
  Administered 2019-01-07: 10 mg/h via INTRAVENOUS
  Administered 2019-01-07: 9 mg/h via INTRAVENOUS
  Administered 2019-01-07: 7 mg/h via INTRAVENOUS
  Administered 2019-01-08: 8 mg/h via INTRAVENOUS
  Administered 2019-01-08: 9 mg/h via INTRAVENOUS
  Administered 2019-01-08 (×2): 8 mg/h via INTRAVENOUS
  Administered 2019-01-08: 10 mg/h via INTRAVENOUS
  Administered 2019-01-09: 7 mg/h via INTRAVENOUS
  Administered 2019-01-09 (×2): 8 mg/h via INTRAVENOUS
  Administered 2019-01-10: 6 mg/h via INTRAVENOUS
  Administered 2019-01-10: 7 mg/h via INTRAVENOUS
  Administered 2019-01-10: 8 mg/h via INTRAVENOUS
  Administered 2019-01-10: 7 mg/h via INTRAVENOUS
  Administered 2019-01-11: 6 mg/h via INTRAVENOUS
  Administered 2019-01-11: 8 mg/h via INTRAVENOUS
  Administered 2019-01-11: 5 mg/h via INTRAVENOUS
  Administered 2019-01-12: 4 mg/h via INTRAVENOUS
  Administered 2019-01-12: 6 mg/h via INTRAVENOUS
  Administered 2019-01-12 (×2): 7 mg/h via INTRAVENOUS
  Administered 2019-01-13: 8 mg/h via INTRAVENOUS
  Administered 2019-01-13: 6 mg/h via INTRAVENOUS
  Administered 2019-01-14 – 2019-01-15 (×5): 7 mg/h via INTRAVENOUS
  Administered 2019-01-15: 10 mg/h via INTRAVENOUS
  Administered 2019-01-15 (×2): 7 mg/h via INTRAVENOUS
  Administered 2019-01-16: 5 mg/h via INTRAVENOUS
  Administered 2019-01-16 (×2): 7 mg/h via INTRAVENOUS
  Administered 2019-01-17 (×2): 8 mg/h via INTRAVENOUS
  Administered 2019-01-17: 6 mg/h via INTRAVENOUS
  Administered 2019-01-17 – 2019-01-18 (×4): 7 mg/h via INTRAVENOUS
  Administered 2019-01-18: 8 mg/h via INTRAVENOUS
  Filled 2018-12-21 (×87): qty 50

## 2018-12-21 MED ORDER — ACETAMINOPHEN 325 MG PO TABS
650.0000 mg | ORAL_TABLET | Freq: Four times a day (QID) | ORAL | Status: DC | PRN
  Administered 2019-02-09: 650 mg via ORAL
  Filled 2018-12-21: qty 2
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
## 2018-12-21 NOTE — Progress Notes (Signed)
   12/21/18 2100  Vitals  Temp (!) 101.7 F (38.7 C)  BP (!) 143/62  MAP (mmHg) 84  Pulse Rate (!) 124  ECG Heart Rate (!) 126  Resp 16  Oxygen Therapy  SpO2 98 %  Art Line  Arterial Line BP 87/76  Arterial Line MAP (mmHg) 82 mmHg  Invasive Hemodynamic Monitoring  ICP Mean (mmHg) 12 mmHg  CPP 72  Provider Notification  Provider Name/Title Ramierez, MD  Date Provider Notified 12/21/18  Time Provider Notified 2130  Notification Type Page  Notification Reason Change in status (Temp 101.7, poor pain contol)  Response See new orders (Tylenol PRN, fentanyl & versed gtt d/c propofol)  Date of Provider Response 12/21/18  Time of Provider Response 2130

## 2018-12-22 ENCOUNTER — Encounter (HOSPITAL_COMMUNITY): Admission: EM | Disposition: A | Discharge status: Rehabilitation Facility | Payer: Self-pay | Source: Home / Self Care

## 2018-12-22 ENCOUNTER — Inpatient Hospital Stay (HOSPITAL_COMMUNITY): Payer: Medicaid Other

## 2018-12-22 ENCOUNTER — Encounter (HOSPITAL_COMMUNITY): Payer: Self-pay | Admitting: General Surgery

## 2018-12-22 ENCOUNTER — Inpatient Hospital Stay (HOSPITAL_COMMUNITY): Payer: Medicaid Other | Admitting: Anesthesiology

## 2018-12-22 HISTORY — PX: ORIF ULNAR FRACTURE: SHX5417

## 2018-12-22 LAB — CBC
HCT: 28.6 % — ABNORMAL LOW (ref 39.0–52.0)
Hemoglobin: 9.8 g/dL — ABNORMAL LOW (ref 13.0–17.0)
MCH: 31.7 pg (ref 26.0–34.0)
MCHC: 34.3 g/dL (ref 30.0–36.0)
MCV: 92.6 fL (ref 80.0–100.0)
Platelets: 162 10*3/uL (ref 150–400)
RBC: 3.09 MIL/uL — ABNORMAL LOW (ref 4.22–5.81)
RDW: 13.7 % (ref 11.5–15.5)
WBC: 18.5 10*3/uL — ABNORMAL HIGH (ref 4.0–10.5)
nRBC: 0 % (ref 0.0–0.2)

## 2018-12-22 LAB — BASIC METABOLIC PANEL
Anion gap: 7 (ref 5–15)
BUN: <5 mg/dL — ABNORMAL LOW (ref 6–20)
CO2: 25 mmol/L (ref 22–32)
Calcium: 7.9 mg/dL — ABNORMAL LOW (ref 8.9–10.3)
Chloride: 106 mmol/L (ref 98–111)
Creatinine, Ser: 0.76 mg/dL (ref 0.61–1.24)
GFR calc Af Amer: >60 mL/min (ref >60)
Glucose, Bld: 125 mg/dL — ABNORMAL HIGH (ref 70–99)
Potassium: 3.5 mmol/L (ref 3.5–5.1)
Sodium: 138 mmol/L (ref 135–145)

## 2018-12-22 LAB — POCT I-STAT 7, (LYTES, BLD GAS, ICA,H+H)
Acid-base deficit: 11 mmol/L — ABNORMAL HIGH (ref 0.0–2.0)
Bicarbonate: 17.9 mmol/L — ABNORMAL LOW (ref 20.0–28.0)
Calcium, Ion: 1.02 mmol/L — ABNORMAL LOW (ref 1.15–1.40)
HCT: 37 % — ABNORMAL LOW (ref 39.0–52.0)
Hemoglobin: 12.6 g/dL — ABNORMAL LOW (ref 13.0–17.0)
O2 Saturation: 100 %
POTASSIUM: 3.9 mmol/L (ref 3.5–5.1)
Patient temperature: 36.7
Sodium: 142 mmol/L (ref 135–145)
TCO2: 19 mmol/L — AB (ref 22–32)
pCO2 arterial: 51.8 mmHg — ABNORMAL HIGH (ref 32.0–48.0)
pH, Arterial: 7.144 — CL (ref 7.350–7.450)
pO2, Arterial: 462 mmHg — ABNORMAL HIGH (ref 83.0–108.0)

## 2018-12-22 LAB — SURGICAL PCR SCREEN
MRSA, PCR: NEGATIVE
Staphylococcus aureus: POSITIVE — AB

## 2018-12-22 SURGERY — OPEN REDUCTION INTERNAL FIXATION (ORIF) ULNAR FRACTURE
Anesthesia: General | Site: Arm Lower | Laterality: Left

## 2018-12-22 MED ORDER — CHLORHEXIDINE GLUCONATE CLOTH 2 % EX PADS: 6.0000 | MEDICATED_PAD | Freq: Every day | CUTANEOUS | Status: DC

## 2018-12-22 MED ORDER — MIDAZOLAM HCL 2 MG/2ML IJ SOLN
INTRAMUSCULAR | Status: AC
  Filled 2018-12-22: qty 2

## 2018-12-22 MED ORDER — BUPIVACAINE HCL (PF) 0.25 % IJ SOLN
INTRAMUSCULAR | Status: DC | PRN
  Administered 2018-12-22: 30 mL

## 2018-12-22 MED ORDER — METOPROLOL TARTRATE 5 MG/5ML IV SOLN: 5.0000 mg | INTRAVENOUS | Status: DC | PRN

## 2018-12-22 MED ORDER — VANCOMYCIN HCL 1000 MG IV SOLR
INTRAVENOUS | Status: DC | PRN
  Administered 2018-12-22: 1000 mg via TOPICAL

## 2018-12-22 MED ORDER — METOPROLOL TARTRATE 5 MG/5ML IV SOLN
5.0000 mg | INTRAVENOUS | Status: DC | PRN
  Administered 2018-12-22 – 2019-02-09 (×16): 5 mg via INTRAVENOUS
  Filled 2018-12-22 (×18): qty 5

## 2018-12-22 MED ORDER — VANCOMYCIN HCL 1000 MG IV SOLR
INTRAVENOUS | Status: AC
  Filled 2018-12-22: qty 1000

## 2018-12-22 MED ORDER — CALCIUM GLUCONATE-NACL 1-0.675 GM/50ML-% IV SOLN: 1.0000 g | Freq: Once | INTRAVENOUS | Status: DC

## 2018-12-22 MED ORDER — CEFAZOLIN SODIUM-DEXTROSE 2-3 GM-%(50ML) IV SOLR
INTRAVENOUS | Status: DC | PRN
  Administered 2018-12-22: 2 g via INTRAVENOUS

## 2018-12-22 MED ORDER — CEFAZOLIN SODIUM 1 G IJ SOLR
INTRAMUSCULAR | Status: AC
  Filled 2018-12-22: qty 20

## 2018-12-22 MED ORDER — PROPOFOL 10 MG/ML IV BOLUS
INTRAVENOUS | Status: AC
  Filled 2018-12-22: qty 20

## 2018-12-22 MED ORDER — LACTATED RINGERS IV SOLN
INTRAVENOUS | Status: DC | PRN
  Administered 2018-12-22: 08:00:00 via INTRAVENOUS

## 2018-12-22 MED ORDER — ROCURONIUM BROMIDE 50 MG/5ML IV SOSY
PREFILLED_SYRINGE | INTRAVENOUS | Status: AC
  Filled 2018-12-22: qty 25

## 2018-12-22 MED ORDER — CHLORHEXIDINE GLUCONATE CLOTH 2 % EX PADS
6.0000 | MEDICATED_PAD | Freq: Every day | CUTANEOUS | Status: AC
  Administered 2018-12-22 – 2018-12-25 (×4): 6 via TOPICAL

## 2018-12-22 MED ORDER — PHENYLEPHRINE 40 MCG/ML (10ML) SYRINGE FOR IV PUSH (FOR BLOOD PRESSURE SUPPORT)
PREFILLED_SYRINGE | INTRAVENOUS | Status: AC
  Filled 2018-12-22: qty 20

## 2018-12-22 MED ORDER — LIDOCAINE 2% (20 MG/ML) 5 ML SYRINGE
INTRAMUSCULAR | Status: AC
  Filled 2018-12-22: qty 5

## 2018-12-22 MED ORDER — BUPIVACAINE HCL (PF) 0.25 % IJ SOLN
INTRAMUSCULAR | Status: AC
  Filled 2018-12-22: qty 30

## 2018-12-22 MED ORDER — ROCURONIUM BROMIDE 10 MG/ML (PF) SYRINGE
PREFILLED_SYRINGE | INTRAVENOUS | Status: DC | PRN
  Administered 2018-12-22: 30 mg via INTRAVENOUS
  Administered 2018-12-22 (×3): 50 mg via INTRAVENOUS

## 2018-12-22 MED ORDER — SODIUM CHLORIDE 0.9 % IV SOLN
INTRAVENOUS | Status: DC
  Administered 2018-12-22: 11:00:00 via INTRAVENOUS
  Filled 2018-12-22 (×4): qty 1000

## 2018-12-22 MED ORDER — MUPIROCIN 2 % EX OINT
1.0000 "application " | TOPICAL_OINTMENT | Freq: Two times a day (BID) | CUTANEOUS | Status: AC
  Administered 2018-12-22 – 2018-12-26 (×9): 1 via NASAL
  Filled 2018-12-22: qty 22

## 2018-12-22 MED ORDER — FENTANYL CITRATE (PF) 250 MCG/5ML IJ SOLN
INTRAMUSCULAR | Status: AC
  Filled 2018-12-22: qty 5

## 2018-12-22 MED ORDER — BACITRACIN ZINC 500 UNIT/GM EX OINT
TOPICAL_OINTMENT | CUTANEOUS | Status: AC
  Filled 2018-12-22: qty 28.35

## 2018-12-22 MED ORDER — POTASSIUM CHLORIDE IN NACL 20-0.9 MEQ/L-% IV SOLN
INTRAVENOUS | Status: DC
  Administered 2018-12-22 – 2018-12-26 (×5): via INTRAVENOUS
  Filled 2018-12-22 (×5): qty 1000

## 2018-12-22 MED ORDER — MIDAZOLAM HCL 2 MG/2ML IJ SOLN
INTRAMUSCULAR | Status: DC | PRN
  Administered 2018-12-22: 2 mg via INTRAVENOUS

## 2018-12-22 MED ORDER — 0.9 % SODIUM CHLORIDE (POUR BTL) OPTIME
TOPICAL | Status: DC | PRN
  Administered 2018-12-22: 1000 mL

## 2018-12-22 MED ORDER — METOPROLOL TARTRATE 5 MG/5ML IV SOLN
INTRAVENOUS | Status: AC
  Filled 2018-12-22: qty 5

## 2018-12-22 MED ORDER — CEFAZOLIN SODIUM-DEXTROSE 2-4 GM/100ML-% IV SOLN
2.0000 g | Freq: Three times a day (TID) | INTRAVENOUS | Status: AC
  Administered 2018-12-22 – 2018-12-23 (×3): 2 g via INTRAVENOUS
  Filled 2018-12-22 (×3): qty 100

## 2018-12-22 MED ORDER — FENTANYL CITRATE (PF) 250 MCG/5ML IJ SOLN
INTRAMUSCULAR | Status: DC | PRN
  Administered 2018-12-22: 100 ug via INTRAVENOUS

## 2018-12-22 MED ORDER — BACITRACIN ZINC 500 UNIT/GM EX OINT
TOPICAL_OINTMENT | CUTANEOUS | Status: DC | PRN
  Administered 2018-12-22: 1 via TOPICAL

## 2018-12-22 SURGICAL SUPPLY — 73 items
BANDAGE ACE 3X5.8 VEL STRL LF (GAUZE/BANDAGES/DRESSINGS) ×3 IMPLANT
BANDAGE ACE 4X5 VEL STRL LF (GAUZE/BANDAGES/DRESSINGS) ×3 IMPLANT
BIT DRILL 2.5X110 QC LCP DISP (BIT) ×3 IMPLANT
BIT DRILL CALIBRATED 1.8MM (BIT) ×1 IMPLANT
BIT DRILL QC 2.4X100 (BIT) ×3 IMPLANT
BLADE CLIPPER SURG (BLADE) IMPLANT
BNDG ESMARK 4X9 LF (GAUZE/BANDAGES/DRESSINGS) IMPLANT
BNDG GAUZE ELAST 4 BULKY (GAUZE/BANDAGES/DRESSINGS) IMPLANT
COVER SURGICAL LIGHT HANDLE (MISCELLANEOUS) ×3 IMPLANT
COVER WAND RF STERILE (DRAPES) ×3 IMPLANT
CUFF TOURNIQUET SINGLE 18IN (TOURNIQUET CUFF) IMPLANT
CUFF TOURNIQUET SINGLE 24IN (TOURNIQUET CUFF) IMPLANT
DECANTER SPIKE VIAL GLASS SM (MISCELLANEOUS) ×3 IMPLANT
DRAIN TLS ROUND 10FR (DRAIN) IMPLANT
DRAPE C-ARM 42X72 X-RAY (DRAPES) ×3 IMPLANT
DRAPE OEC MINIVIEW 54X84 (DRAPES) IMPLANT
DRAPE PROXIMA HALF (DRAPES) ×3 IMPLANT
DRAPE SURG 17X23 STRL (DRAPES) IMPLANT
DRAPE U-SHAPE 47X51 STRL (DRAPES) ×3 IMPLANT
DRILL CALIBRATED 1.8MM (BIT) ×3
DRSG ADAPTIC 3X8 NADH LF (GAUZE/BANDAGES/DRESSINGS) ×3 IMPLANT
GAUZE SPONGE 4X4 12PLY STRL (GAUZE/BANDAGES/DRESSINGS) IMPLANT
GAUZE SPONGE 4X4 12PLY STRL LF (GAUZE/BANDAGES/DRESSINGS) ×3 IMPLANT
GAUZE XEROFORM 1X8 LF (GAUZE/BANDAGES/DRESSINGS) IMPLANT
GLOVE BIO SURGEON STRL SZ 6.5 (GLOVE) ×4 IMPLANT
GLOVE BIO SURGEON STRL SZ7.5 (GLOVE) ×9 IMPLANT
GLOVE BIO SURGEONS STRL SZ 6.5 (GLOVE) ×2
GLOVE BIOGEL M 8.0 STRL (GLOVE) IMPLANT
GLOVE SS BIOGEL STRL SZ 8 (GLOVE) IMPLANT
GLOVE SUPERSENSE BIOGEL SZ 8 (GLOVE)
GOWN STRL REUS W/ TWL LRG LVL3 (GOWN DISPOSABLE) ×1 IMPLANT
GOWN STRL REUS W/ TWL XL LVL3 (GOWN DISPOSABLE) ×2 IMPLANT
GOWN STRL REUS W/TWL LRG LVL3 (GOWN DISPOSABLE) ×2
GOWN STRL REUS W/TWL XL LVL3 (GOWN DISPOSABLE) ×4
K-WIRE 1.6X150 (WIRE) ×3
KIT BASIN OR (CUSTOM PROCEDURE TRAY) ×3 IMPLANT
KIT TURNOVER KIT B (KITS) ×3 IMPLANT
KWIRE 1.6X150 (WIRE) ×1 IMPLANT
LOOP VESSEL MAXI BLUE (MISCELLANEOUS) IMPLANT
MANIFOLD NEPTUNE II (INSTRUMENTS) ×3 IMPLANT
NEEDLE 22X1 1/2 (OR ONLY) (NEEDLE) ×3 IMPLANT
NS IRRIG 1000ML POUR BTL (IV SOLUTION) ×3 IMPLANT
PACK ORTHO EXTREMITY (CUSTOM PROCEDURE TRAY) ×3 IMPLANT
PAD ARMBOARD 7.5X6 YLW CONV (MISCELLANEOUS) ×6 IMPLANT
PAD CAST 4YDX4 CTTN HI CHSV (CAST SUPPLIES) ×1 IMPLANT
PADDING CAST COTTON 4X4 STRL (CAST SUPPLIES) ×2
PLATE LCP 3.5 7H 98 (Plate) ×3 IMPLANT
SCREW CORTEX 2.4X16MM (Screw) ×6 IMPLANT
SCREW CORTEX 2.4X18 (Screw) ×3 IMPLANT
SCREW CORTEX 3.5 18MM (Screw) ×2 IMPLANT
SCREW CORTEX 3.5 20MM (Screw) ×8 IMPLANT
SCREW CORTEX 3.5 22MM (Screw) ×4 IMPLANT
SCREW LOCK CORT ST 3.5X18 (Screw) ×1 IMPLANT
SCREW LOCK CORT ST 3.5X20 (Screw) ×4 IMPLANT
SCREW LOCK CORT ST 3.5X22 (Screw) ×2 IMPLANT
SCRUB BETADINE 4OZ XXX (MISCELLANEOUS) IMPLANT
SOL PREP POV-IOD 4OZ 10% (MISCELLANEOUS) IMPLANT
SUT ETHILON 3 0 PS 1 (SUTURE) ×6 IMPLANT
SUT PROLENE 3 0 PS 2 (SUTURE) IMPLANT
SUT VIC AB 0 CT1 27 (SUTURE) ×4
SUT VIC AB 0 CT1 27XBRD ANBCTR (SUTURE) ×2 IMPLANT
SUT VIC AB 2-0 CT1 27 (SUTURE) ×4
SUT VIC AB 2-0 CT1 TAPERPNT 27 (SUTURE) ×2 IMPLANT
SUT VIC AB 3-0 FS2 27 (SUTURE) IMPLANT
SYR CONTROL 10ML LL (SYRINGE) ×3 IMPLANT
SYSTEM CHEST DRAIN TLS 7FR (DRAIN) IMPLANT
TOWEL OR 17X24 6PK STRL BLUE (TOWEL DISPOSABLE) ×3 IMPLANT
TOWEL OR 17X26 10 PK STRL BLUE (TOWEL DISPOSABLE) ×3 IMPLANT
TUBE CONNECTING 12'X1/4 (SUCTIONS) ×1
TUBE CONNECTING 12X1/4 (SUCTIONS) ×2 IMPLANT
TUBE EVACUATION TLS (MISCELLANEOUS) IMPLANT
WATER STERILE IRR 1000ML POUR (IV SOLUTION) IMPLANT
YANKAUER SUCT BULB TIP NO VENT (SUCTIONS) ×3 IMPLANT
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
## 2018-12-22 NOTE — Progress Notes (Signed)
  NEUROSURGERY PROGRESS NOTE   Unable to examine patient as he is currently in OR for ORIF. Discussed with nursing. His ICPs were roughly 12 throughout the night. He was following commands. Will continue EVD for now. Will clamp tomorrow.

## 2018-12-22 NOTE — Anesthesia Postprocedure Evaluation (Signed)
Anesthesia Post Note  Patient: Hunter Cook  Procedure(s) Performed: OPEN REDUCTION INTERNAL FIXATION ULNAR FRACTURE (Left Arm Lower)     Patient location during evaluation: PACU Anesthesia Type: General Level of consciousness: awake and alert Pain management: pain level controlled Vital Signs Assessment: post-procedure vital signs reviewed and stable Respiratory status: spontaneous breathing, nonlabored ventilation, respiratory function stable and patient connected to nasal cannula oxygen Cardiovascular status: blood pressure returned to baseline and stable Postop Assessment: no apparent nausea or vomiting Anesthetic complications: no    Last Vitals:  Vitals:   12/22/18 1900 12/22/18 2022  BP: 135/85   Pulse: (!) 112 (!) 114  Resp: 17 16  Temp: (!) 38.5 C   SpO2: 100% 100%    Last Pain:  Vitals:   12/22/18 0700  TempSrc: Bladder                 Jamara Vary
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
## 2018-12-22 NOTE — Transfer of Care (Addendum)
Immediate Anesthesia Transfer of Care Note  Patient: NIRAV SEMMLER  Procedure(s) Performed: OPEN REDUCTION INTERNAL FIXATION ULNAR FRACTURE (Left Arm Lower)  Patient Location: ICU  Anesthesia Type:General  Level of Consciousness: sedated and Patient remains intubated per anesthesia plan  Airway & Oxygen Therapy: Patient remains intubated per anesthesia plan and Patient placed on Ventilator (see vital sign flow sheet for setting)  Post-op Assessment: Report given to RN and Post -op Vital signs reviewed and stable  Post vital signs: Reviewed and stable  Last Vitals:  Vitals Value Taken Time  BP    Temp 37.5 C 12/22/2018 10:27 AM  Pulse 124 12/22/2018 10:27 AM  Resp 19 12/22/2018 10:27 AM  SpO2 86 % 12/22/2018 10:27 AM  Vitals shown include unvalidated device data.  Last Pain:  Vitals:   12/22/18 0700  TempSrc: Bladder         Complications: No apparent anesthesia complications   Patient transported to ICU with standard monitors (HR, BP, SPO2, RR) and emergency drugs/equipment. Controlled ventilation maintained via ambu bag. Report given to bedside RN and respiratory therapist. Pt connected to ICU monitor and ventilator. All questions answered and vital signs stable before leaving

## 2018-12-22 NOTE — Progress Notes (Signed)
Patient ID: Hunter Cook, male   DOB: 12-25-1995, 23 y.o.   MRN: [id number] I updated his aunt at the bedside. Lopressor PRN for HTN/tachycardia.  Violeta Gelinas, MD, MPH, FACS Trauma: [phone] General Surgery: [phone]

## 2018-12-23 ENCOUNTER — Inpatient Hospital Stay (HOSPITAL_COMMUNITY): Payer: Medicaid Other

## 2018-12-23 ENCOUNTER — Encounter (HOSPITAL_COMMUNITY): Payer: Self-pay | Admitting: Student

## 2018-12-23 LAB — BASIC METABOLIC PANEL
Anion gap: 15 (ref 5–15)
BUN: 6 mg/dL (ref 6–20)
CO2: 19 mmol/L — ABNORMAL LOW (ref 22–32)
Calcium: 8.1 mg/dL — ABNORMAL LOW (ref 8.9–10.3)
Chloride: 106 mmol/L (ref 98–111)
Creatinine, Ser: 0.69 mg/dL (ref 0.61–1.24)
GFR calc Af Amer: >60 mL/min (ref >60)
GFR calc non Af Amer: >60 mL/min (ref >60)
Glucose, Bld: 83 mg/dL (ref 70–99)
Potassium: 4.9 mmol/L (ref 3.5–5.1)
Sodium: 140 mmol/L (ref 135–145)

## 2018-12-23 LAB — TYPE AND SCREEN
ABO/RH(D): A POS
ANTIBODY SCREEN: NEGATIVE
UNIT DIVISION: 0
UNIT DIVISION: 0
UNIT DIVISION: 0
Unit division: 0
Unit division: 0
Unit division: 0
Unit division: 0
Unit division: 0
Unit division: 0
Unit division: 0

## 2018-12-23 LAB — BPAM RBC
BLOOD PRODUCT EXPIRATION DATE: 202001312359
BLOOD PRODUCT EXPIRATION DATE: 202002122359
Blood Product Expiration Date: 202001312359
Blood Product Expiration Date: 202002122359
Blood Product Expiration Date: 202002122359
Blood Product Expiration Date: 202002122359
Blood Product Expiration Date: 202002122359
Blood Product Expiration Date: 202002122359
Blood Product Expiration Date: 202002122359
Blood Product Expiration Date: 202002122359
ISSUE DATE / TIME: 202001242112
ISSUE DATE / TIME: 202001242112
ISSUE DATE / TIME: 202001250146
ISSUE DATE / TIME: 202001250146
ISSUE DATE / TIME: 202001251235
ISSUE DATE / TIME: 202001251647
Unit Type and Rh: 6200
Unit Type and Rh: 6200
Unit Type and Rh: 6200
Unit Type and Rh: 6200
Unit Type and Rh: 6200
Unit Type and Rh: 6200
Unit Type and Rh: 6200
Unit Type and Rh: 6200
Unit Type and Rh: 9500
Unit Type and Rh: 9500

## 2018-12-23 LAB — CBC
HCT: 27 % — ABNORMAL LOW (ref 39.0–52.0)
Hemoglobin: 8.7 g/dL — ABNORMAL LOW (ref 13.0–17.0)
MCH: 30.3 pg (ref 26.0–34.0)
MCHC: 32.2 g/dL (ref 30.0–36.0)
MCV: 94.1 fL (ref 80.0–100.0)
Platelets: 233 10*3/uL (ref 150–400)
RBC: 2.87 MIL/uL — ABNORMAL LOW (ref 4.22–5.81)
RDW: 13.2 % (ref 11.5–15.5)
WBC: 22.2 10*3/uL — ABNORMAL HIGH (ref 4.0–10.5)
nRBC: 0.2 % (ref 0.0–0.2)

## 2018-12-23 LAB — PHOSPHORUS
Phosphorus: 2.4 mg/dL — ABNORMAL LOW (ref 2.5–4.6)
Phosphorus: 2.4 mg/dL — ABNORMAL LOW (ref 2.5–4.6)

## 2018-12-23 LAB — MAGNESIUM
Magnesium: 2.3 mg/dL (ref 1.7–2.4)
Magnesium: 2.2 mg/dL (ref 1.7–2.4)

## 2018-12-23 LAB — GLUCOSE, CAPILLARY
Glucose-Capillary: 103 mg/dL — ABNORMAL HIGH (ref 70–99)
Glucose-Capillary: 119 mg/dL — ABNORMAL HIGH (ref 70–99)

## 2018-12-23 MED ORDER — PIVOT 1.5 CAL PO LIQD
1000.0000 mL | ORAL | Status: DC
  Administered 2018-12-23 – 2018-12-29 (×9): 1000 mL

## 2018-12-23 MED ORDER — VITAL HIGH PROTEIN PO LIQD: 1000.0000 mL | ORAL | Status: DC

## 2018-12-23 MED ORDER — PRO-STAT SUGAR FREE PO LIQD: 30.0000 mL | Freq: Two times a day (BID) | ORAL | Status: DC
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
## 2018-12-23 NOTE — Progress Notes (Signed)
  NEUROSURGERY PROGRESS NOTE   Remains intubated and sedated due to agitation. While awake, nursing, reports following commands  EXAM:  BP (!) 145/72   Pulse (!) 115   Temp (!) 101.1 F (38.4 C) (Axillary)   Resp 16   Ht 5\' 10"  (1.778 m)   Wt 100.2 kg   SpO2 95%   BMI 31.70 kg/m   Intubated, sedated PERRL  PLAN Stable neurologically. Requires sedation due to agitation. Will clamp EVD. continue to monitor ICPs. If stable, will remove EVD tomorrow.
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
## 2018-12-24 ENCOUNTER — Inpatient Hospital Stay (HOSPITAL_COMMUNITY): Payer: Medicaid Other

## 2018-12-24 LAB — CBC
HCT: 26.5 % — ABNORMAL LOW (ref 39.0–52.0)
Hemoglobin: 8.6 g/dL — ABNORMAL LOW (ref 13.0–17.0)
MCH: 30.4 pg (ref 26.0–34.0)
MCHC: 32.5 g/dL (ref 30.0–36.0)
MCV: 93.6 fL (ref 80.0–100.0)
Platelets: 336 10*3/uL (ref 150–400)
RBC: 2.83 MIL/uL — ABNORMAL LOW (ref 4.22–5.81)
RDW: 13.5 % (ref 11.5–15.5)
WBC: 20.4 10*3/uL — ABNORMAL HIGH (ref 4.0–10.5)
nRBC: 0.5 % — ABNORMAL HIGH (ref 0.0–0.2)

## 2018-12-24 LAB — BASIC METABOLIC PANEL
Anion gap: 9 (ref 5–15)
BUN: 13 mg/dL (ref 6–20)
CO2: 26 mmol/L (ref 22–32)
Calcium: 8.2 mg/dL — ABNORMAL LOW (ref 8.9–10.3)
Chloride: 105 mmol/L (ref 98–111)
Creatinine, Ser: 0.62 mg/dL (ref 0.61–1.24)
GFR calc Af Amer: >60 mL/min (ref >60)
GFR calc non Af Amer: >60 mL/min (ref >60)
Glucose, Bld: 150 mg/dL — ABNORMAL HIGH (ref 70–99)
Potassium: 3.9 mmol/L (ref 3.5–5.1)
Sodium: 140 mmol/L (ref 135–145)

## 2018-12-24 LAB — GLUCOSE, CAPILLARY
GLUCOSE-CAPILLARY: 104 mg/dL — AB (ref 70–99)
GLUCOSE-CAPILLARY: 107 mg/dL — AB (ref 70–99)
Glucose-Capillary: 141 mg/dL — ABNORMAL HIGH (ref 70–99)
Glucose-Capillary: 109 mg/dL — ABNORMAL HIGH (ref 70–99)
Glucose-Capillary: 122 mg/dL — ABNORMAL HIGH (ref 70–99)
Glucose-Capillary: 115 mg/dL — ABNORMAL HIGH (ref 70–99)

## 2018-12-24 LAB — PHOSPHORUS
Phosphorus: 3 mg/dL (ref 2.5–4.6)
Phosphorus: 2.9 mg/dL (ref 2.5–4.6)

## 2018-12-24 LAB — MAGNESIUM
MAGNESIUM: 2.2 mg/dL (ref 1.7–2.4)
Magnesium: 2.3 mg/dL (ref 1.7–2.4)

## 2018-12-24 MED ORDER — SODIUM CHLORIDE 0.9 % IV SOLN
2.0000 g | Freq: Two times a day (BID) | INTRAVENOUS | Status: DC
  Administered 2018-12-24 – 2018-12-29 (×12): 2 g via INTRAVENOUS
  Filled 2018-12-24 (×13): qty 2

## 2018-12-24 MED ORDER — GUAIFENESIN 100 MG/5ML PO SOLN
5.0000 mL | Freq: Four times a day (QID) | ORAL | Status: DC
  Administered 2018-12-24 – 2019-01-20 (×107): 100 mg
  Administered 2019-01-20: 5 mL
  Administered 2019-01-20 – 2019-01-25 (×16): 100 mg
  Filled 2018-12-24 (×26): qty 15
  Filled 2018-12-24: qty 5
  Filled 2018-12-24 (×12): qty 15
  Filled 2018-12-24 (×2): qty 45
  Filled 2018-12-24 (×11): qty 15
  Filled 2018-12-24: qty 5
  Filled 2018-12-24 (×15): qty 15
  Filled 2018-12-24 (×2): qty 5
  Filled 2018-12-24 (×15): qty 15
  Filled 2018-12-24 (×2): qty 5
  Filled 2018-12-24 (×2): qty 15
  Filled 2018-12-24: qty 5
  Filled 2018-12-24 (×6): qty 15
  Filled 2018-12-24: qty 5
  Filled 2018-12-24 (×5): qty 15
  Filled 2018-12-24: qty 5
  Filled 2018-12-24 (×3): qty 15
  Filled 2018-12-24: qty 5
  Filled 2018-12-24 (×7): qty 15
  Filled 2018-12-24: qty 5
  Filled 2018-12-24: qty 15

## 2018-12-24 MED ORDER — POLYETHYLENE GLYCOL 3350 17 G PO PACK
17.0000 g | PACK | Freq: Every day | ORAL | Status: DC
  Administered 2018-12-24 – 2019-01-17 (×15): 17 g
  Filled 2018-12-24 (×16): qty 1

## 2018-12-24 MED ORDER — BETHANECHOL CHLORIDE 10 MG PO TABS
10.0000 mg | ORAL_TABLET | Freq: Three times a day (TID) | ORAL | Status: DC
  Administered 2018-12-24 – 2018-12-29 (×18): 10 mg
  Filled 2018-12-24 (×19): qty 1

## 2018-12-24 MED ORDER — DOCUSATE SODIUM 50 MG/5ML PO LIQD
50.0000 mg | Freq: Two times a day (BID) | ORAL | Status: DC
  Administered 2018-12-24 – 2019-01-24 (×45): 50 mg
  Filled 2018-12-24 (×47): qty 10

## 2018-12-24 NOTE — Progress Notes (Signed)
Patient transported to CT and back to 4N22 with no complications. Vitals stable.

## 2018-12-24 NOTE — Progress Notes (Signed)
FIO2 increased due to patient desating.

## 2018-12-24 NOTE — Progress Notes (Signed)
Patient's ICP keep getting closer to 20s, sometimes 24, no continuously. Dr. Maurice Small notified. Mentioned that in report, RN was told of a possible head CT in am and that the order is not in. With ICPs increasing, MD felt necessary to obtain head CT this am.   0400- Patient tolerated transport to CT well.
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
## 2018-12-24 NOTE — Progress Notes (Signed)
Patient ID: Hunter Cook, male   DOB: 05/01/1996, 23 y.o.   MRN: [id number] I spoke with his family. Violeta GelinasBurke Naydelin Ziegler, MD, MPH, FACS Trauma: [phone] General Surgery: [phone]

## 2018-12-24 NOTE — Progress Notes (Addendum)
  NEUROSURGERY PROGRESS NOTE   Increase in ICP overnight. EVD reopened. CT head reviewed and stable. No other issues overnight.   Remains intubated/sedated.   EXAM:  BP 138/83 (BP Location: Right Arm)   Pulse (!) 116   Temp 99.9 F (37.7 C) (Axillary)   Resp 11   Ht 5\' 10"  (1.778 m)   Wt 98.1 kg   SpO2 98%   BMI 31.03 kg/m   Intubated, sedated PERRL Reportedly following commands EVD in place  PLAN Rise in ICP although no change in neuro status - still following commands. Reclamp EVD. No need to monitor ICP. Will open drain should exam change, but otherwise will likely remove tomorrow.    I have seen and examined Vicente Serene and agree with the exam, impression, and plan as documented in the note by Cindra Presume, PA-C.  Lisbeth Renshaw, MD [facility]

## 2018-12-25 LAB — URINE CULTURE: Culture: NO GROWTH

## 2018-12-25 LAB — CBC
HCT: 25.8 % — ABNORMAL LOW (ref 39.0–52.0)
Hemoglobin: 8.4 g/dL — ABNORMAL LOW (ref 13.0–17.0)
MCH: 30.8 pg (ref 26.0–34.0)
MCHC: 32.6 g/dL (ref 30.0–36.0)
MCV: 94.5 fL (ref 80.0–100.0)
Platelets: 463 10*3/uL — ABNORMAL HIGH (ref 150–400)
RBC: 2.73 MIL/uL — ABNORMAL LOW (ref 4.22–5.81)
RDW: 14.3 % (ref 11.5–15.5)
WBC: 17.8 10*3/uL — ABNORMAL HIGH (ref 4.0–10.5)
nRBC: 1.9 % — ABNORMAL HIGH (ref 0.0–0.2)

## 2018-12-25 LAB — BASIC METABOLIC PANEL
ANION GAP: 7 (ref 5–15)
BUN: 14 mg/dL (ref 6–20)
CO2: 28 mmol/L (ref 22–32)
Calcium: 8.2 mg/dL — ABNORMAL LOW (ref 8.9–10.3)
Chloride: 109 mmol/L (ref 98–111)
Creatinine, Ser: 0.59 mg/dL — ABNORMAL LOW (ref 0.61–1.24)
GFR calc Af Amer: >60 mL/min (ref >60)
GFR calc non Af Amer: >60 mL/min (ref >60)
Glucose, Bld: 103 mg/dL — ABNORMAL HIGH (ref 70–99)
POTASSIUM: 3.8 mmol/L (ref 3.5–5.1)
Sodium: 144 mmol/L (ref 135–145)

## 2018-12-25 LAB — GLUCOSE, CAPILLARY
Glucose-Capillary: 107 mg/dL — ABNORMAL HIGH (ref 70–99)
Glucose-Capillary: 109 mg/dL — ABNORMAL HIGH (ref 70–99)
Glucose-Capillary: 111 mg/dL — ABNORMAL HIGH (ref 70–99)
Glucose-Capillary: 129 mg/dL — ABNORMAL HIGH (ref 70–99)
Glucose-Capillary: 112 mg/dL — ABNORMAL HIGH (ref 70–99)
Glucose-Capillary: 141 mg/dL — ABNORMAL HIGH (ref 70–99)
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
## 2018-12-25 NOTE — Progress Notes (Signed)
RT NOTE:  ETT advanced 2cm to 26 at the lips per order. Vitals are stable. RT will continue to monitor.

## 2018-12-25 NOTE — Progress Notes (Signed)
RT NOTE: RT found FIO2 on 50%. RT titrated FIO2 down to 40% based on sats of 100%. Vitals are stable. RT will continue to monitor.

## 2018-12-25 NOTE — Progress Notes (Signed)
  NEUROSURGERY PROGRESS NOTE   EVD removed in clean fashion. Incision was closed with 2 staples. Patient tolerated well.
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
## 2018-12-26 DIAGNOSIS — S42102A Fracture of unspecified part of scapula, left shoulder, initial encounter for closed fracture: Secondary | ICD-10-CM

## 2018-12-26 DIAGNOSIS — I615 Nontraumatic intracerebral hemorrhage, intraventricular: Secondary | ICD-10-CM

## 2018-12-26 DIAGNOSIS — S52272A Monteggia's fracture of left ulna, initial encounter for closed fracture: Secondary | ICD-10-CM

## 2018-12-26 DIAGNOSIS — S2249XA Multiple fractures of ribs, unspecified side, initial encounter for closed fracture: Secondary | ICD-10-CM

## 2018-12-26 DIAGNOSIS — S3609XA Other injury of spleen, initial encounter: Secondary | ICD-10-CM

## 2018-12-26 LAB — BASIC METABOLIC PANEL
Anion gap: 9 (ref 5–15)
BUN: 18 mg/dL (ref 6–20)
CO2: 29 mmol/L (ref 22–32)
Calcium: 8.3 mg/dL — ABNORMAL LOW (ref 8.9–10.3)
Chloride: 108 mmol/L (ref 98–111)
Creatinine, Ser: 0.6 mg/dL — ABNORMAL LOW (ref 0.61–1.24)
GFR calc Af Amer: >60 mL/min (ref >60)
GFR calc non Af Amer: >60 mL/min (ref >60)
Glucose, Bld: 115 mg/dL — ABNORMAL HIGH (ref 70–99)
Potassium: 4 mmol/L (ref 3.5–5.1)
Sodium: 146 mmol/L — ABNORMAL HIGH (ref 135–145)

## 2018-12-26 LAB — CBC
HCT: 25.3 % — ABNORMAL LOW (ref 39.0–52.0)
Hemoglobin: 7.9 g/dL — ABNORMAL LOW (ref 13.0–17.0)
MCH: 29.9 pg (ref 26.0–34.0)
MCHC: 31.2 g/dL (ref 30.0–36.0)
MCV: 95.8 fL (ref 80.0–100.0)
Platelets: 607 10*3/uL — ABNORMAL HIGH (ref 150–400)
RBC: 2.64 MIL/uL — ABNORMAL LOW (ref 4.22–5.81)
RDW: 14.7 % (ref 11.5–15.5)
WBC: 13.4 10*3/uL — ABNORMAL HIGH (ref 4.0–10.5)
nRBC: 3.3 % — ABNORMAL HIGH (ref 0.0–0.2)

## 2018-12-26 LAB — GLUCOSE, CAPILLARY
Glucose-Capillary: 109 mg/dL — ABNORMAL HIGH (ref 70–99)
Glucose-Capillary: 98 mg/dL (ref 70–99)
Glucose-Capillary: 135 mg/dL — ABNORMAL HIGH (ref 70–99)
Glucose-Capillary: 123 mg/dL — ABNORMAL HIGH (ref 70–99)
Glucose-Capillary: 142 mg/dL — ABNORMAL HIGH (ref 70–99)
Glucose-Capillary: 99 mg/dL (ref 70–99)

## 2018-12-26 MED ORDER — DEXMEDETOMIDINE HCL IN NACL 400 MCG/100ML IV SOLN
0.4000 ug/kg/h | INTRAVENOUS | Status: DC
  Administered 2018-12-26: 1.1 ug/kg/h via INTRAVENOUS
  Administered 2018-12-26 – 2018-12-27 (×7): 1.2 ug/kg/h via INTRAVENOUS
  Administered 2018-12-28 (×3): 1.5 ug/kg/h via INTRAVENOUS
  Administered 2018-12-28: 1.2 ug/kg/h via INTRAVENOUS
  Administered 2018-12-29: 1.5 ug/kg/h via INTRAVENOUS
  Administered 2018-12-29 (×2): 1 ug/kg/h via INTRAVENOUS
  Administered 2018-12-29: 1.5 ug/kg/h via INTRAVENOUS
  Administered 2018-12-29: 1 ug/kg/h via INTRAVENOUS
  Administered 2018-12-29: 1.5 ug/kg/h via INTRAVENOUS
  Administered 2018-12-29: 1.9 ug/kg/h via INTRAVENOUS
  Administered 2018-12-30 (×2): 2 ug/kg/h via INTRAVENOUS
  Administered 2018-12-30: 1.8 ug/kg/h via INTRAVENOUS
  Administered 2018-12-30 (×5): 2 ug/kg/h via INTRAVENOUS
  Administered 2018-12-30: 1.8 ug/kg/h via INTRAVENOUS
  Administered 2018-12-30 – 2019-01-06 (×80): 2 ug/kg/h via INTRAVENOUS
  Administered 2019-01-07: 1.6 ug/kg/h via INTRAVENOUS
  Administered 2019-01-07: 2 ug/kg/h via INTRAVENOUS
  Administered 2019-01-07: 1.6 ug/kg/h via INTRAVENOUS
  Administered 2019-01-07: 2 ug/kg/h via INTRAVENOUS
  Administered 2019-01-07: 1.6 ug/kg/h via INTRAVENOUS
  Administered 2019-01-07: 1.5 ug/kg/h via INTRAVENOUS
  Administered 2019-01-07: 1.8 ug/kg/h via INTRAVENOUS
  Administered 2019-01-07: 1.5 ug/kg/h via INTRAVENOUS
  Administered 2019-01-08 (×3): 2 ug/kg/h via INTRAVENOUS
  Administered 2019-01-08: 1.8 ug/kg/h via INTRAVENOUS
  Administered 2019-01-08 (×4): 2 ug/kg/h via INTRAVENOUS
  Administered 2019-01-08: 1.8 ug/kg/h via INTRAVENOUS
  Administered 2019-01-08 – 2019-01-09 (×6): 2 ug/kg/h via INTRAVENOUS
  Administered 2019-01-09: 1.9 ug/kg/h via INTRAVENOUS
  Administered 2019-01-09: 2 ug/kg/h via INTRAVENOUS
  Administered 2019-01-09: 1.8 ug/kg/h via INTRAVENOUS
  Administered 2019-01-09: 0.819 ug/kg/h via INTRAVENOUS
  Administered 2019-01-09: 2 ug/kg/h via INTRAVENOUS
  Administered 2019-01-09 (×2): 1.9 ug/kg/h via INTRAVENOUS
  Administered 2019-01-09 – 2019-01-10 (×12): 2 ug/kg/h via INTRAVENOUS
  Administered 2019-01-11: 1.8 ug/kg/h via INTRAVENOUS
  Administered 2019-01-11 (×4): 2 ug/kg/h via INTRAVENOUS
  Administered 2019-01-11: 0.819 ug/kg/h via INTRAVENOUS
  Administered 2019-01-11 – 2019-01-12 (×7): 2 ug/kg/h via INTRAVENOUS
  Administered 2019-01-12: 1.8 ug/kg/h via INTRAVENOUS
  Administered 2019-01-12 (×2): 2 ug/kg/h via INTRAVENOUS
  Administered 2019-01-12: 0.8 ug/kg/h via INTRAVENOUS
  Administered 2019-01-12 – 2019-01-13 (×5): 2 ug/kg/h via INTRAVENOUS
  Administered 2019-01-13: 1.6 ug/kg/h via INTRAVENOUS
  Administered 2019-01-13: 2 ug/kg/h via INTRAVENOUS
  Administered 2019-01-13: 1.6 ug/kg/h via INTRAVENOUS
  Administered 2019-01-13: 2 ug/kg/h via INTRAVENOUS
  Administered 2019-01-13: 1.8 ug/kg/h via INTRAVENOUS
  Administered 2019-01-13 – 2019-01-14 (×5): 2 ug/kg/h via INTRAVENOUS
  Administered 2019-01-14: 1.8 ug/kg/h via INTRAVENOUS
  Administered 2019-01-14 – 2019-01-16 (×20): 2 ug/kg/h via INTRAVENOUS
  Administered 2019-01-16: 1.9 ug/kg/h via INTRAVENOUS
  Administered 2019-01-16 – 2019-01-17 (×14): 2 ug/kg/h via INTRAVENOUS
  Administered 2019-01-17: 1.9 ug/kg/h via INTRAVENOUS
  Administered 2019-01-17 – 2019-01-18 (×2): 2 ug/kg/h via INTRAVENOUS
  Administered 2019-01-18: 1.8 ug/kg/h via INTRAVENOUS
  Administered 2019-01-18: 1.9 ug/kg/h via INTRAVENOUS
  Administered 2019-01-18: 1.8 ug/kg/h via INTRAVENOUS
  Administered 2019-01-18: 1.7 ug/kg/h via INTRAVENOUS
  Administered 2019-01-18: 2 ug/kg/h via INTRAVENOUS
  Administered 2019-01-18: 1.9 ug/kg/h via INTRAVENOUS
  Administered 2019-01-18: 2 ug/kg/h via INTRAVENOUS
  Administered 2019-01-19: 1.8 ug/kg/h via INTRAVENOUS
  Administered 2019-01-19: 1.7 ug/kg/h via INTRAVENOUS
  Administered 2019-01-19 (×4): 1.8 ug/kg/h via INTRAVENOUS
  Administered 2019-01-19: 1.7 ug/kg/h via INTRAVENOUS
  Administered 2019-01-19: 1.8 ug/kg/h via INTRAVENOUS
  Administered 2019-01-20: 1.7 ug/kg/h via INTRAVENOUS
  Administered 2019-01-20 (×4): 1.4 ug/kg/h via INTRAVENOUS
  Administered 2019-01-20: 1.7 ug/kg/h via INTRAVENOUS
  Administered 2019-01-20: 1.8 ug/kg/h via INTRAVENOUS
  Administered 2019-01-20: 1.4 ug/kg/h via INTRAVENOUS
  Administered 2019-01-20: 1.8 ug/kg/h via INTRAVENOUS
  Administered 2019-01-21 (×8): 1.4 ug/kg/h via INTRAVENOUS
  Administered 2019-01-22 (×2): 2 ug/kg/h via INTRAVENOUS
  Administered 2019-01-22: 1.4 ug/kg/h via INTRAVENOUS
  Administered 2019-01-22: 2 ug/kg/h via INTRAVENOUS
  Administered 2019-01-22: 1.4 ug/kg/h via INTRAVENOUS
  Administered 2019-01-22 – 2019-01-23 (×7): 2 ug/kg/h via INTRAVENOUS
  Administered 2019-01-23: 1.6 ug/kg/h via INTRAVENOUS
  Administered 2019-01-23: 2 ug/kg/h via INTRAVENOUS
  Administered 2019-01-23: 1.7 ug/kg/h via INTRAVENOUS
  Administered 2019-01-23: 2 ug/kg/h via INTRAVENOUS
  Administered 2019-01-23 (×2): 1.6 ug/kg/h via INTRAVENOUS
  Administered 2019-01-23 (×2): 2 ug/kg/h via INTRAVENOUS
  Administered 2019-01-24: 1.7 ug/kg/h via INTRAVENOUS
  Administered 2019-01-24: 1.8 ug/kg/h via INTRAVENOUS
  Administered 2019-01-24: 1.6 ug/kg/h via INTRAVENOUS
  Administered 2019-01-24 (×3): 1.7 ug/kg/h via INTRAVENOUS
  Administered 2019-01-24: 1.9 ug/kg/h via INTRAVENOUS
  Administered 2019-01-24: 1.7 ug/kg/h via INTRAVENOUS
  Administered 2019-01-24: 1.8 ug/kg/h via INTRAVENOUS
  Administered 2019-01-25: 2 ug/kg/h via INTRAVENOUS
  Administered 2019-01-25: 1.8 ug/kg/h via INTRAVENOUS
  Administered 2019-01-25: 1.7 ug/kg/h via INTRAVENOUS
  Administered 2019-01-25: 1.8 ug/kg/h via INTRAVENOUS
  Administered 2019-01-25: 2 ug/kg/h via INTRAVENOUS
  Administered 2019-01-25: 1.7 ug/kg/h via INTRAVENOUS
  Administered 2019-01-25 – 2019-01-26 (×4): 2 ug/kg/h via INTRAVENOUS
  Administered 2019-01-26: 1.8 ug/kg/h via INTRAVENOUS
  Administered 2019-01-26: 1.7 ug/kg/h via INTRAVENOUS
  Administered 2019-01-26: 1.9 ug/kg/h via INTRAVENOUS
  Administered 2019-01-26 (×3): 2 ug/kg/h via INTRAVENOUS
  Administered 2019-01-26: 1.9 ug/kg/h via INTRAVENOUS
  Administered 2019-01-26: 2 ug/kg/h via INTRAVENOUS
  Administered 2019-01-26: 1.9 ug/kg/h via INTRAVENOUS
  Administered 2019-01-27: 1.7 ug/kg/h via INTRAVENOUS
  Administered 2019-01-27 (×6): 2 ug/kg/h via INTRAVENOUS
  Administered 2019-01-27: 1.7 ug/kg/h via INTRAVENOUS
  Administered 2019-01-27: 2 ug/kg/h via INTRAVENOUS
  Administered 2019-01-27 (×2): 1.7 ug/kg/h via INTRAVENOUS
  Administered 2019-01-28 (×2): 2 ug/kg/h via INTRAVENOUS
  Administered 2019-01-28: 1.8 ug/kg/h via INTRAVENOUS
  Administered 2019-01-28: 2 ug/kg/h via INTRAVENOUS
  Administered 2019-01-28 (×2): 1.9 ug/kg/h via INTRAVENOUS
  Administered 2019-01-28: 1.7 ug/kg/h via INTRAVENOUS
  Administered 2019-01-28: 1.5 ug/kg/h via INTRAVENOUS
  Administered 2019-01-28: 1.9 ug/kg/h via INTRAVENOUS
  Administered 2019-01-29: 1.8 ug/kg/h via INTRAVENOUS
  Administered 2019-01-29: 1.7 ug/kg/h via INTRAVENOUS
  Administered 2019-01-29 (×2): 1.8 ug/kg/h via INTRAVENOUS
  Administered 2019-01-29: 2 ug/kg/h via INTRAVENOUS
  Administered 2019-01-29: 1.6 ug/kg/h via INTRAVENOUS
  Administered 2019-01-29: 2 ug/kg/h via INTRAVENOUS
  Administered 2019-01-29: 1.8 ug/kg/h via INTRAVENOUS
  Administered 2019-01-29 (×2): 2 ug/kg/h via INTRAVENOUS
  Administered 2019-01-29: 1.9 ug/kg/h via INTRAVENOUS
  Administered 2019-01-30: 1.6 ug/kg/h via INTRAVENOUS
  Administered 2019-01-30: 2 ug/kg/h via INTRAVENOUS
  Administered 2019-01-30: 1.7 ug/kg/h via INTRAVENOUS
  Administered 2019-01-30: 2 ug/kg/h via INTRAVENOUS
  Administered 2019-01-30 (×2): 1.7 ug/kg/h via INTRAVENOUS
  Administered 2019-01-30 (×2): 1.8 ug/kg/h via INTRAVENOUS
  Administered 2019-01-30 (×2): 1.7 ug/kg/h via INTRAVENOUS
  Administered 2019-01-31 (×2): 1.5 ug/kg/h via INTRAVENOUS
  Administered 2019-01-31 (×2): 1.7 ug/kg/h via INTRAVENOUS
  Administered 2019-01-31: 1.6 ug/kg/h via INTRAVENOUS
  Administered 2019-01-31 (×3): 1.7 ug/kg/h via INTRAVENOUS
  Administered 2019-02-01 (×3): 1.5 ug/kg/h via INTRAVENOUS
  Administered 2019-02-01: 2 ug/kg/h via INTRAVENOUS
  Administered 2019-02-01 (×2): 1.5 ug/kg/h via INTRAVENOUS
  Administered 2019-02-01: 2 ug/kg/h via INTRAVENOUS
  Administered 2019-02-01: 1.8 ug/kg/h via INTRAVENOUS
  Administered 2019-02-02: 1.4 ug/kg/h via INTRAVENOUS
  Administered 2019-02-02: 1.6 ug/kg/h via INTRAVENOUS
  Administered 2019-02-02: 1.5 ug/kg/h via INTRAVENOUS
  Administered 2019-02-02: 1 ug/kg/h via INTRAVENOUS
  Administered 2019-02-02: 1.4 ug/kg/h via INTRAVENOUS
  Administered 2019-02-02: 1.2 ug/kg/h via INTRAVENOUS
  Administered 2019-02-02: 1 ug/kg/h via INTRAVENOUS
  Administered 2019-02-03: 1.5 ug/kg/h via INTRAVENOUS
  Administered 2019-02-03: 1.7 ug/kg/h via INTRAVENOUS
  Administered 2019-02-03: 1.5 ug/kg/h via INTRAVENOUS
  Administered 2019-02-03: 1.4 ug/kg/h via INTRAVENOUS
  Administered 2019-02-03 (×2): 1.7 ug/kg/h via INTRAVENOUS
  Administered 2019-02-03: 0.5 ug/kg/h via INTRAVENOUS
  Administered 2019-02-03 – 2019-02-04 (×2): 1.5 ug/kg/h via INTRAVENOUS
  Filled 2018-12-26 (×3): qty 100
  Filled 2018-12-26: qty 200
  Filled 2018-12-26 (×6): qty 100
  Filled 2018-12-26: qty 200
  Filled 2018-12-26: qty 100
  Filled 2018-12-26: qty 200
  Filled 2018-12-26: qty 100
  Filled 2018-12-26: qty 200
  Filled 2018-12-26 (×3): qty 100
  Filled 2018-12-26 (×5): qty 200
  Filled 2018-12-26 (×15): qty 100
  Filled 2018-12-26: qty 200
  Filled 2018-12-26 (×4): qty 100
  Filled 2018-12-26: qty 200
  Filled 2018-12-26: qty 100
  Filled 2018-12-26: qty 200
  Filled 2018-12-26 (×2): qty 100
  Filled 2018-12-26: qty 200
  Filled 2018-12-26 (×3): qty 100
  Filled 2018-12-26: qty 200
  Filled 2018-12-26 (×8): qty 100
  Filled 2018-12-26: qty 200
  Filled 2018-12-26 (×2): qty 100
  Filled 2018-12-26: qty 200
  Filled 2018-12-26 (×9): qty 100
  Filled 2018-12-26: qty 200
  Filled 2018-12-26 (×13): qty 100
  Filled 2018-12-26: qty 500
  Filled 2018-12-26 (×2): qty 100
  Filled 2018-12-26 (×2): qty 200
  Filled 2018-12-26 (×2): qty 100
  Filled 2018-12-26: qty 200
  Filled 2018-12-26 (×3): qty 100
  Filled 2018-12-26 (×2): qty 200
  Filled 2018-12-26 (×8): qty 100
  Filled 2018-12-26: qty 200
  Filled 2018-12-26 (×2): qty 100
  Filled 2018-12-26: qty 300
  Filled 2018-12-26 (×2): qty 100
  Filled 2018-12-26: qty 200
  Filled 2018-12-26 (×3): qty 100
  Filled 2018-12-26: qty 200
  Filled 2018-12-26 (×11): qty 100
  Filled 2018-12-26: qty 200
  Filled 2018-12-26: qty 100
  Filled 2018-12-26: qty 200
  Filled 2018-12-26 (×6): qty 100
  Filled 2018-12-26 (×2): qty 200
  Filled 2018-12-26 (×6): qty 100
  Filled 2018-12-26 (×2): qty 200
  Filled 2018-12-26 (×2): qty 100
  Filled 2018-12-26 (×2): qty 200
  Filled 2018-12-26: qty 600
  Filled 2018-12-26: qty 100
  Filled 2018-12-26: qty 200
  Filled 2018-12-26 (×4): qty 100
  Filled 2018-12-26: qty 200
  Filled 2018-12-26: qty 300
  Filled 2018-12-26 (×5): qty 100
  Filled 2018-12-26: qty 200
  Filled 2018-12-26 (×2): qty 100
  Filled 2018-12-26: qty 200
  Filled 2018-12-26 (×3): qty 100
  Filled 2018-12-26: qty 200
  Filled 2018-12-26 (×6): qty 100
  Filled 2018-12-26: qty 200
  Filled 2018-12-26 (×2): qty 100
  Filled 2018-12-26: qty 200
  Filled 2018-12-26: qty 600
  Filled 2018-12-26: qty 300
  Filled 2018-12-26: qty 100
  Filled 2018-12-26: qty 200
  Filled 2018-12-26 (×2): qty 100
  Filled 2018-12-26: qty 200
  Filled 2018-12-26 (×2): qty 100
  Filled 2018-12-26: qty 200
  Filled 2018-12-26 (×2): qty 100
  Filled 2018-12-26: qty 200
  Filled 2018-12-26: qty 100
  Filled 2018-12-26: qty 200
  Filled 2018-12-26: qty 100
  Filled 2018-12-26: qty 200
  Filled 2018-12-26 (×4): qty 100
  Filled 2018-12-26: qty 200
  Filled 2018-12-26 (×8): qty 100
  Filled 2018-12-26 (×2): qty 200
  Filled 2018-12-26 (×4): qty 100
  Filled 2018-12-26: qty 300
  Filled 2018-12-26 (×6): qty 100
  Filled 2018-12-26: qty 200
  Filled 2018-12-26 (×2): qty 100
  Filled 2018-12-26: qty 200
  Filled 2018-12-26 (×4): qty 100
  Filled 2018-12-26: qty 200
  Filled 2018-12-26: qty 100
  Filled 2018-12-26: qty 400
  Filled 2018-12-26 (×2): qty 100
  Filled 2018-12-26: qty 200
  Filled 2018-12-26: qty 100
  Filled 2018-12-26: qty 300
  Filled 2018-12-26 (×4): qty 100
  Filled 2018-12-26: qty 200
  Filled 2018-12-26 (×2): qty 100
  Filled 2018-12-26 (×3): qty 200
  Filled 2018-12-26 (×10): qty 100
  Filled 2018-12-26 (×2): qty 200
  Filled 2018-12-26 (×4): qty 100
  Filled 2018-12-26: qty 200
  Filled 2018-12-26 (×11): qty 100
  Filled 2018-12-26: qty 300
  Filled 2018-12-26 (×11): qty 100
  Filled 2018-12-26: qty 200
  Filled 2018-12-26 (×3): qty 100
  Filled 2018-12-26: qty 200
  Filled 2018-12-26 (×8): qty 100

## 2018-12-26 MED ORDER — DEXMEDETOMIDINE HCL IN NACL 200 MCG/50ML IV SOLN
0.4000 ug/kg/h | INTRAVENOUS | Status: DC
  Administered 2018-12-26: 0.8 ug/kg/h via INTRAVENOUS
  Administered 2018-12-26: 0.4 ug/kg/h via INTRAVENOUS
  Filled 2018-12-26: qty 100

## 2018-12-26 MED ORDER — CLONAZEPAM 0.5 MG PO TBDP
1.0000 mg | ORAL_TABLET | Freq: Two times a day (BID) | ORAL | Status: DC
  Administered 2018-12-26 – 2018-12-27 (×4): 1 mg via ORAL
  Filled 2018-12-26 (×5): qty 2

## 2018-12-26 MED ORDER — SODIUM CHLORIDE 0.9 % IV SOLN
INTRAVENOUS | Status: DC | PRN
  Administered 2018-12-26 – 2019-01-04 (×3): 500 mL via INTRAVENOUS

## 2018-12-26 MED ORDER — QUETIAPINE FUMARATE 25 MG PO TABS
50.0000 mg | ORAL_TABLET | Freq: Two times a day (BID) | ORAL | Status: DC
  Administered 2018-12-26 – 2018-12-27 (×4): 50 mg via ORAL
  Filled 2018-12-26 (×5): qty 2
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
## 2018-12-26 NOTE — Progress Notes (Signed)
Patient tolerating pressure support well, but unable to come off versed due to significant agitation. Will start klonopin, seroquel, precedex. Wean off versed gtt and may need to extubate on precedex.
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
## 2018-12-27 ENCOUNTER — Inpatient Hospital Stay: Payer: Self-pay

## 2018-12-27 LAB — CULTURE, RESPIRATORY W GRAM STAIN: Culture: NORMAL

## 2018-12-27 LAB — BASIC METABOLIC PANEL
Anion gap: 11 (ref 5–15)
BUN: 24 mg/dL — ABNORMAL HIGH (ref 6–20)
CO2: 28 mmol/L (ref 22–32)
Calcium: 8.7 mg/dL — ABNORMAL LOW (ref 8.9–10.3)
Chloride: 103 mmol/L (ref 98–111)
Creatinine, Ser: 0.81 mg/dL (ref 0.61–1.24)
GFR calc Af Amer: >60 mL/min (ref >60)
Glucose, Bld: 113 mg/dL — ABNORMAL HIGH (ref 70–99)
Potassium: 4.3 mmol/L (ref 3.5–5.1)
Sodium: 142 mmol/L (ref 135–145)

## 2018-12-27 LAB — GLUCOSE, CAPILLARY
Glucose-Capillary: 104 mg/dL — ABNORMAL HIGH (ref 70–99)
Glucose-Capillary: 106 mg/dL — ABNORMAL HIGH (ref 70–99)
Glucose-Capillary: 118 mg/dL — ABNORMAL HIGH (ref 70–99)
Glucose-Capillary: 124 mg/dL — ABNORMAL HIGH (ref 70–99)
Glucose-Capillary: 128 mg/dL — ABNORMAL HIGH (ref 70–99)
Glucose-Capillary: 130 mg/dL — ABNORMAL HIGH (ref 70–99)

## 2018-12-27 LAB — CBC
HCT: 26.8 % — ABNORMAL LOW (ref 39.0–52.0)
Hemoglobin: 8.6 g/dL — ABNORMAL LOW (ref 13.0–17.0)
MCH: 30.6 pg (ref 26.0–34.0)
MCHC: 32.1 g/dL (ref 30.0–36.0)
MCV: 95.4 fL (ref 80.0–100.0)
Platelets: 813 10*3/uL — ABNORMAL HIGH (ref 150–400)
RBC: 2.81 MIL/uL — ABNORMAL LOW (ref 4.22–5.81)
RDW: 14.6 % (ref 11.5–15.5)
WBC: 20 10*3/uL — AB (ref 4.0–10.5)
nRBC: 3.4 % — ABNORMAL HIGH (ref 0.0–0.2)

## 2018-12-27 LAB — CULTURE, RESPIRATORY

## 2018-12-27 MED ORDER — FENTANYL CITRATE (PF) 100 MCG/2ML IJ SOLN
INTRAMUSCULAR | Status: AC
  Administered 2018-12-27: 100 ug
  Filled 2018-12-27: qty 2

## 2018-12-27 MED ORDER — SODIUM CHLORIDE 0.9% FLUSH: 10.0000 mL | INTRAVENOUS | Status: DC | PRN

## 2018-12-27 MED ORDER — CHLORHEXIDINE GLUCONATE CLOTH 2 % EX PADS
6.0000 | MEDICATED_PAD | Freq: Every day | CUTANEOUS | Status: DC
  Administered 2018-12-27 – 2019-01-19 (×22): 6 via TOPICAL

## 2018-12-27 MED ORDER — HALOPERIDOL LACTATE 5 MG/ML IJ SOLN
INTRAMUSCULAR | Status: AC
  Administered 2018-12-27: 5 mg
  Filled 2018-12-27: qty 1

## 2018-12-27 MED ORDER — SODIUM CHLORIDE 0.9% FLUSH
10.0000 mL | Freq: Two times a day (BID) | INTRAVENOUS | Status: DC
  Administered 2018-12-27: 10 mL
  Administered 2018-12-27: 20 mL
  Administered 2018-12-28 – 2018-12-29 (×2): 10 mL
  Administered 2018-12-30: 20 mL
  Administered 2018-12-30 – 2018-12-31 (×2): 10 mL
  Administered 2019-01-01: 20 mL
  Administered 2019-01-02 – 2019-01-03 (×2): 10 mL
  Administered 2019-01-03: 20 mL
  Administered 2019-01-04: 10 mL
  Administered 2019-01-04: 20 mL
  Administered 2019-01-05 – 2019-01-08 (×5): 10 mL
  Administered 2019-01-08 (×2): 20 mL
  Administered 2019-01-09: 10 mL
  Administered 2019-01-09: 20 mL
  Administered 2019-01-10 – 2019-01-22 (×23): 10 mL
  Administered 2019-01-22: 20 mL
  Administered 2019-01-23 – 2019-01-25 (×5): 10 mL
  Administered 2019-01-25: 20 mL
  Administered 2019-01-26 – 2019-01-27 (×3): 10 mL
  Administered 2019-01-27 – 2019-01-28 (×2): 20 mL
  Administered 2019-01-28 – 2019-01-31 (×7): 10 mL
  Administered 2019-02-01 (×2): 20 mL
  Administered 2019-02-02 – 2019-02-04 (×4): 10 mL
  Administered 2019-02-05 (×2): 20 mL
  Administered 2019-02-06 – 2019-02-10 (×8): 10 mL

## 2018-12-27 MED ORDER — MIDAZOLAM HCL 2 MG/2ML IJ SOLN
INTRAMUSCULAR | Status: AC
  Administered 2018-12-27: 10:00:00
  Filled 2018-12-27: qty 2

## 2018-12-27 NOTE — Progress Notes (Signed)
Peripherally Inserted Central Catheter/Midline Placement  The IV Nurse has discussed with the patient and/or persons authorized to consent for the patient, the purpose of this procedure and the potential benefits and risks involved with this procedure.  The benefits include less needle sticks, lab draws from the catheter, and the patient may be discharged home with the catheter. Risks include, but not limited to, infection, bleeding, blood clot (thrombus formation), and puncture of an artery; nerve damage and irregular heartbeat and possibility to perform a PICC exchange if needed/ordered by physician.  Alternatives to this procedure were also discussed.  Bard Power PICC patient education guide, fact sheet on infection prevention and patient information card has been provided to patient /or left at bedside.  Telephone consent obtained from father due to sedation/AMS.  PICC/Midline Placement Documentation  PICC Double Lumen 12/27/18 PICC Right Brachial 42 cm 1 cm (Active)  Indication for Insertion or Continuance of Line Vasoactive infusions 12/27/2018  1:22 PM  Exposed Catheter (cm) 1 cm 12/27/2018  1:22 PM  Site Assessment Clean;Dry;Intact 12/27/2018  1:22 PM  Lumen #1 Status Flushed;Saline locked;Blood return noted 12/27/2018  1:22 PM  Lumen #2 Status Flushed;Saline locked;Blood return noted 12/27/2018  1:22 PM  Dressing Type Transparent 12/27/2018  1:22 PM  Dressing Status Clean;Dry;Intact;Antimicrobial disc in place 12/27/2018  1:22 PM  Line Care Connections checked and tightened 12/27/2018  1:22 PM  Line Adjustment (NICU/IV Team Only) No 12/27/2018  1:22 PM  Dressing Intervention New dressing 12/27/2018  1:22 PM  Dressing Change Due 01/03/19 12/27/2018  1:22 PM       Elliot Dally 12/27/2018, 1:23 PM
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
## 2018-12-27 NOTE — Progress Notes (Signed)
Patient awake and all over bed. Pt pulled out ETT to 23cm at lip. RN gave sedation. RT pushed ETT back to 25cm at lip. Chest xray is needed to confirm placement. Good bilateral breath sounds. Patient is receiving tidal volumes and O2 saturation is 98%. RT will continue to monitor.

## 2018-12-27 NOTE — Progress Notes (Signed)
Dr. Corliss Skains contacted concerning recent fever this morning. He was aware, blood cultures have been negative for three days, and continued to want PICC placed.

## 2018-12-27 NOTE — Progress Notes (Signed)
Pt very agitated and lost peripheral iv access.  Trauma paged but not did receive call back.  CCM on floor notified and new orders received verbally.  Pt has new IV access and sedation restarted.  Trauma MD updated during rounds.

## 2018-12-28 LAB — GLUCOSE, CAPILLARY
GLUCOSE-CAPILLARY: 121 mg/dL — AB (ref 70–99)
GLUCOSE-CAPILLARY: 106 mg/dL — AB (ref 70–99)
Glucose-Capillary: 110 mg/dL — ABNORMAL HIGH (ref 70–99)
Glucose-Capillary: 109 mg/dL — ABNORMAL HIGH (ref 70–99)
Glucose-Capillary: 109 mg/dL — ABNORMAL HIGH (ref 70–99)
Glucose-Capillary: 116 mg/dL — ABNORMAL HIGH (ref 70–99)

## 2018-12-28 LAB — BASIC METABOLIC PANEL
Anion gap: 9 (ref 5–15)
BUN: 24 mg/dL — ABNORMAL HIGH (ref 6–20)
CO2: 30 mmol/L (ref 22–32)
Calcium: 8.5 mg/dL — ABNORMAL LOW (ref 8.9–10.3)
Chloride: 104 mmol/L (ref 98–111)
Creatinine, Ser: 0.71 mg/dL (ref 0.61–1.24)
GFR calc non Af Amer: >60 mL/min (ref >60)
Glucose, Bld: 151 mg/dL — ABNORMAL HIGH (ref 70–99)
Potassium: 4.2 mmol/L (ref 3.5–5.1)
Sodium: 143 mmol/L (ref 135–145)

## 2018-12-28 LAB — PATHOLOGIST SMEAR REVIEW

## 2018-12-28 LAB — CBC
HCT: 25.3 % — ABNORMAL LOW (ref 39.0–52.0)
Hemoglobin: 7.9 g/dL — ABNORMAL LOW (ref 13.0–17.0)
MCH: 30.4 pg (ref 26.0–34.0)
MCHC: 31.2 g/dL (ref 30.0–36.0)
MCV: 97.3 fL (ref 80.0–100.0)
Platelets: 823 10*3/uL — ABNORMAL HIGH (ref 150–400)
RBC: 2.6 MIL/uL — ABNORMAL LOW (ref 4.22–5.81)
RDW: 14.8 % (ref 11.5–15.5)
WBC: 19.2 10*3/uL — AB (ref 4.0–10.5)
nRBC: 3.7 % — ABNORMAL HIGH (ref 0.0–0.2)

## 2018-12-28 MED ORDER — QUETIAPINE FUMARATE 100 MG PO TABS
100.0000 mg | ORAL_TABLET | Freq: Two times a day (BID) | ORAL | Status: DC
  Administered 2018-12-28 – 2018-12-29 (×4): 100 mg
  Filled 2018-12-28 (×4): qty 1

## 2018-12-28 MED ORDER — CLONAZEPAM 0.5 MG PO TBDP
2.0000 mg | ORAL_TABLET | Freq: Two times a day (BID) | ORAL | Status: DC
  Administered 2018-12-28 – 2018-12-29 (×4): 2 mg
  Filled 2018-12-28 (×4): qty 4
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
## 2018-12-28 NOTE — Progress Notes (Signed)
Sputum sample sent to lab per MD order 

## 2018-12-29 ENCOUNTER — Inpatient Hospital Stay (HOSPITAL_COMMUNITY): Payer: Medicaid Other

## 2018-12-29 LAB — CBC
HCT: 26.3 % — ABNORMAL LOW (ref 39.0–52.0)
Hemoglobin: 7.9 g/dL — ABNORMAL LOW (ref 13.0–17.0)
MCH: 29.5 pg (ref 26.0–34.0)
MCHC: 30 g/dL (ref 30.0–36.0)
MCV: 98.1 fL (ref 80.0–100.0)
Platelets: 890 10*3/uL — ABNORMAL HIGH (ref 150–400)
RBC: 2.68 MIL/uL — ABNORMAL LOW (ref 4.22–5.81)
RDW: 14.9 % (ref 11.5–15.5)
WBC: 19.5 10*3/uL — ABNORMAL HIGH (ref 4.0–10.5)
nRBC: 3.1 % — ABNORMAL HIGH (ref 0.0–0.2)

## 2018-12-29 LAB — CULTURE, BLOOD (ROUTINE X 2)
CULTURE: NO GROWTH
Culture: NO GROWTH
Special Requests: ADEQUATE
Special Requests: ADEQUATE

## 2018-12-29 LAB — BASIC METABOLIC PANEL
Anion gap: 12 (ref 5–15)
BUN: 22 mg/dL — ABNORMAL HIGH (ref 6–20)
CALCIUM: 8.3 mg/dL — AB (ref 8.9–10.3)
CO2: 27 mmol/L (ref 22–32)
Chloride: 103 mmol/L (ref 98–111)
Creatinine, Ser: 0.73 mg/dL (ref 0.61–1.24)
GFR calc Af Amer: >60 mL/min (ref >60)
GFR calc non Af Amer: >60 mL/min (ref >60)
Glucose, Bld: 125 mg/dL — ABNORMAL HIGH (ref 70–99)
Potassium: 4.3 mmol/L (ref 3.5–5.1)
Sodium: 142 mmol/L (ref 135–145)

## 2018-12-29 LAB — GLUCOSE, CAPILLARY
Glucose-Capillary: 100 mg/dL — ABNORMAL HIGH (ref 70–99)
Glucose-Capillary: 115 mg/dL — ABNORMAL HIGH (ref 70–99)
Glucose-Capillary: 115 mg/dL — ABNORMAL HIGH (ref 70–99)
Glucose-Capillary: 117 mg/dL — ABNORMAL HIGH (ref 70–99)
Glucose-Capillary: 118 mg/dL — ABNORMAL HIGH (ref 70–99)
Glucose-Capillary: 99 mg/dL (ref 70–99)

## 2018-12-29 LAB — BLOOD PRODUCT ORDER (VERBAL) VERIFICATION

## 2018-12-29 MED ORDER — PIVOT 1.5 CAL PO LIQD
1000.0000 mL | ORAL | Status: DC
  Administered 2018-12-29 – 2019-01-24 (×35): 1000 mL
  Filled 2018-12-29 (×5): qty 1000
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
## 2018-12-30 LAB — GLUCOSE, CAPILLARY
GLUCOSE-CAPILLARY: 150 mg/dL — AB (ref 70–99)
GLUCOSE-CAPILLARY: 132 mg/dL — AB (ref 70–99)
Glucose-Capillary: 112 mg/dL — ABNORMAL HIGH (ref 70–99)
Glucose-Capillary: 124 mg/dL — ABNORMAL HIGH (ref 70–99)
Glucose-Capillary: 121 mg/dL — ABNORMAL HIGH (ref 70–99)

## 2018-12-30 MED ORDER — QUETIAPINE FUMARATE 100 MG PO TABS
100.0000 mg | ORAL_TABLET | Freq: Three times a day (TID) | ORAL | Status: DC
  Administered 2018-12-30 – 2019-01-01 (×6): 100 mg
  Filled 2018-12-30 (×6): qty 1

## 2018-12-30 MED ORDER — VANCOMYCIN HCL IN DEXTROSE 1-5 GM/200ML-% IV SOLN
1000.0000 mg | Freq: Three times a day (TID) | INTRAVENOUS | Status: DC
  Administered 2018-12-30 – 2018-12-31 (×2): 1000 mg via INTRAVENOUS
  Filled 2018-12-30 (×2): qty 200

## 2018-12-30 MED ORDER — ENOXAPARIN SODIUM 40 MG/0.4ML ~~LOC~~ SOLN
40.0000 mg | SUBCUTANEOUS | Status: DC
  Administered 2018-12-30 – 2019-02-10 (×43): 40 mg via SUBCUTANEOUS
  Filled 2018-12-30 (×43): qty 0.4

## 2018-12-30 MED ORDER — SODIUM CHLORIDE 0.9 % IV SOLN
INTRAVENOUS | Status: DC | PRN
  Administered 2018-12-30 – 2018-12-31 (×2): via INTRAVENOUS

## 2018-12-30 MED ORDER — IPRATROPIUM-ALBUTEROL 0.5-2.5 (3) MG/3ML IN SOLN: 3.0000 mL | RESPIRATORY_TRACT | Status: DC | PRN

## 2018-12-30 MED ORDER — VANCOMYCIN HCL 10 G IV SOLR
2000.0000 mg | Freq: Once | INTRAVENOUS | Status: AC
  Administered 2018-12-30: 2000 mg via INTRAVENOUS
  Filled 2018-12-30: qty 2000

## 2018-12-30 MED ORDER — PIPERACILLIN-TAZOBACTAM 3.375 G IVPB
3.3750 g | Freq: Three times a day (TID) | INTRAVENOUS | Status: AC
  Administered 2018-12-30 – 2019-01-06 (×23): 3.375 g via INTRAVENOUS
  Filled 2018-12-30 (×23): qty 50

## 2018-12-30 MED ORDER — IBUPROFEN 100 MG/5ML PO SUSP
400.0000 mg | Freq: Three times a day (TID) | ORAL | Status: DC | PRN
  Administered 2018-12-30 – 2019-02-09 (×8): 400 mg
  Filled 2018-12-30 (×9): qty 20

## 2018-12-30 MED ORDER — BETHANECHOL CHLORIDE 25 MG PO TABS
25.0000 mg | ORAL_TABLET | Freq: Three times a day (TID) | ORAL | Status: DC
  Administered 2018-12-30 – 2019-02-09 (×124): 25 mg
  Filled 2018-12-30 (×7): qty 3
  Filled 2018-12-30: qty 1
  Filled 2018-12-30 (×13): qty 3
  Filled 2018-12-30: qty 1
  Filled 2018-12-30 (×21): qty 3
  Filled 2018-12-30: qty 1
  Filled 2018-12-30 (×15): qty 3
  Filled 2018-12-30: qty 1
  Filled 2018-12-30 (×3): qty 3
  Filled 2018-12-30: qty 1
  Filled 2018-12-30 (×3): qty 3
  Filled 2018-12-30 (×3): qty 1
  Filled 2018-12-30 (×43): qty 3
  Filled 2018-12-30: qty 1
  Filled 2018-12-30 (×10): qty 3

## 2018-12-30 MED ORDER — CLONAZEPAM 0.5 MG PO TBDP
2.0000 mg | ORAL_TABLET | Freq: Three times a day (TID) | ORAL | Status: DC
  Administered 2018-12-30 – 2019-02-06 (×113): 2 mg
  Filled 2018-12-30 (×114): qty 4
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
## 2018-12-30 NOTE — Progress Notes (Signed)
Pt's sats dropped to 87% on PRVC; manually bagged with improvement to 95%. Placed back on ventilator, still dyssynchronous so sedation bolused and increased. RT increased Fi02 to 60%. Trauma MD paged.

## 2018-12-31 ENCOUNTER — Inpatient Hospital Stay (HOSPITAL_COMMUNITY): Payer: Medicaid Other

## 2018-12-31 ENCOUNTER — Other Ambulatory Visit (HOSPITAL_COMMUNITY): Payer: Self-pay | Admitting: Radiology

## 2018-12-31 LAB — PATHOLOGIST SMEAR REVIEW

## 2018-12-31 LAB — GLUCOSE, CAPILLARY
Glucose-Capillary: 106 mg/dL — ABNORMAL HIGH (ref 70–99)
Glucose-Capillary: 107 mg/dL — ABNORMAL HIGH (ref 70–99)
Glucose-Capillary: 116 mg/dL — ABNORMAL HIGH (ref 70–99)
Glucose-Capillary: 124 mg/dL — ABNORMAL HIGH (ref 70–99)
Glucose-Capillary: 132 mg/dL — ABNORMAL HIGH (ref 70–99)
Glucose-Capillary: 133 mg/dL — ABNORMAL HIGH (ref 70–99)
Glucose-Capillary: 147 mg/dL — ABNORMAL HIGH (ref 70–99)

## 2018-12-31 LAB — BASIC METABOLIC PANEL
Anion gap: 6 (ref 5–15)
BUN: 17 mg/dL (ref 6–20)
CO2: 29 mmol/L (ref 22–32)
CREATININE: 0.67 mg/dL (ref 0.61–1.24)
Calcium: 8.3 mg/dL — ABNORMAL LOW (ref 8.9–10.3)
Chloride: 105 mmol/L (ref 98–111)
GFR calc Af Amer: >60 mL/min (ref >60)
GFR calc non Af Amer: >60 mL/min (ref >60)
Glucose, Bld: 127 mg/dL — ABNORMAL HIGH (ref 70–99)
Potassium: 4.3 mmol/L (ref 3.5–5.1)
Sodium: 140 mmol/L (ref 135–145)

## 2018-12-31 LAB — CBC
HCT: 26 % — ABNORMAL LOW (ref 39.0–52.0)
Hemoglobin: 8.2 g/dL — ABNORMAL LOW (ref 13.0–17.0)
MCH: 30.4 pg (ref 26.0–34.0)
MCHC: 31.5 g/dL (ref 30.0–36.0)
MCV: 96.3 fL (ref 80.0–100.0)
Platelets: 1006 10*3/uL (ref 150–400)
RBC: 2.7 MIL/uL — ABNORMAL LOW (ref 4.22–5.81)
RDW: 14.6 % (ref 11.5–15.5)
WBC: 24.7 10*3/uL — ABNORMAL HIGH (ref 4.0–10.5)
nRBC: 1.1 % — ABNORMAL HIGH (ref 0.0–0.2)

## 2018-12-31 LAB — CULTURE, RESPIRATORY W GRAM STAIN: Special Requests: NORMAL

## 2018-12-31 MED ORDER — IOPAMIDOL (ISOVUE-300) INJECTION 61%
100.0000 mL | Freq: Once | INTRAVENOUS | Status: AC | PRN
  Administered 2018-12-31: 100 mL via INTRAVENOUS

## 2018-12-31 NOTE — Progress Notes (Signed)
Pt's SpO2 noted to be 85% on PRVC, with HR in the 150s and RR in the 50s. FiO2 increased to 100%, suctioned ETT with no improvement. Taken off ventilator and manually bagged until SpO2 was 100%, put back on vent with FiO2 at 60, RT notified and will come assess. Trauma MD notified.

## 2018-12-31 NOTE — Progress Notes (Signed)
Pt transported to CT and back without complications. 
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
## 2018-12-31 NOTE — Progress Notes (Signed)
Consult to retract PICC 3 cm per Radiologist reading today 2-5. Previous CXR on 2-3 listed with tip of PICC in CAJ. ECG technology when placed on 2-1 states tip in low SVC. PICC not retracted. Will await CXR in AM and get input from attending MD if there is still a conflict in the reading from the original radiology report.

## 2018-12-31 NOTE — Progress Notes (Signed)
Called to room by RN. Upon arrival pt coughing. During bath pt on side and coughed ETT out from 26cm to 22cm. Cuff deflated, ETT advanced back to 26cm. A new ETT holder was placed on pt. Good return tidal volumes on the vent, pt SpO2 increased and bilateral breath sounds heard. Pt suctioned for moderate amount of blood tinged secretions. Chest x-ray ordered and Trauma MD notified by RN.

## 2019-01-01 ENCOUNTER — Inpatient Hospital Stay (HOSPITAL_COMMUNITY): Payer: Medicaid Other

## 2019-01-01 LAB — GLUCOSE, CAPILLARY
GLUCOSE-CAPILLARY: 138 mg/dL — AB (ref 70–99)
Glucose-Capillary: 100 mg/dL — ABNORMAL HIGH (ref 70–99)
Glucose-Capillary: 104 mg/dL — ABNORMAL HIGH (ref 70–99)
Glucose-Capillary: 127 mg/dL — ABNORMAL HIGH (ref 70–99)
Glucose-Capillary: 130 mg/dL — ABNORMAL HIGH (ref 70–99)
Glucose-Capillary: 97 mg/dL (ref 70–99)

## 2019-01-01 LAB — CBC
HCT: 25.3 % — ABNORMAL LOW (ref 39.0–52.0)
Hemoglobin: 7.9 g/dL — ABNORMAL LOW (ref 13.0–17.0)
MCH: 30.2 pg (ref 26.0–34.0)
MCHC: 31.2 g/dL (ref 30.0–36.0)
MCV: 96.6 fL (ref 80.0–100.0)
PLATELETS: 1034 10*3/uL — AB (ref 150–400)
RBC: 2.62 MIL/uL — AB (ref 4.22–5.81)
RDW: 14.7 % (ref 11.5–15.5)
WBC: 19.1 10*3/uL — ABNORMAL HIGH (ref 4.0–10.5)
nRBC: 1.3 % — ABNORMAL HIGH (ref 0.0–0.2)

## 2019-01-01 LAB — BASIC METABOLIC PANEL
Anion gap: 7 (ref 5–15)
BUN: 19 mg/dL (ref 6–20)
CO2: 29 mmol/L (ref 22–32)
Calcium: 8.3 mg/dL — ABNORMAL LOW (ref 8.9–10.3)
Chloride: 104 mmol/L (ref 98–111)
Creatinine, Ser: 0.58 mg/dL — ABNORMAL LOW (ref 0.61–1.24)
GFR calc Af Amer: >60 mL/min (ref >60)
GLUCOSE: 169 mg/dL — AB (ref 70–99)
Potassium: 4.1 mmol/L (ref 3.5–5.1)
Sodium: 140 mmol/L (ref 135–145)

## 2019-01-01 MED ORDER — ALTEPLASE 2 MG IJ SOLR
2.0000 mg | Freq: Once | INTRAMUSCULAR | Status: AC
  Administered 2019-01-01: 2 mg
  Filled 2019-01-01: qty 2

## 2019-01-01 MED ORDER — QUETIAPINE FUMARATE 200 MG PO TABS
200.0000 mg | ORAL_TABLET | Freq: Three times a day (TID) | ORAL | Status: DC
  Administered 2019-01-01 – 2019-01-19 (×54): 200 mg
  Filled 2019-01-01 (×54): qty 1

## 2019-01-01 NOTE — Progress Notes (Signed)
Ortho Trauma Progress Note  Patient remained stable from orthopedic standpoint.  Dressing was removed today.  Incision is clean dry and intact.  Gentle range of motion shows no grimacing on the patient's face.  May discontinue immobilization.  We will recheck x-rays early next week with plans for removal of sutures.  Continue nonweightbearing precautions to left upper extremity.  Roby Lofts, MD [facility] [phone] (phone)
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
## 2019-01-02 LAB — CBC
HCT: 27.4 % — ABNORMAL LOW (ref 39.0–52.0)
Hemoglobin: 8.2 g/dL — ABNORMAL LOW (ref 13.0–17.0)
MCH: 28.9 pg (ref 26.0–34.0)
MCHC: 29.9 g/dL — ABNORMAL LOW (ref 30.0–36.0)
MCV: 96.5 fL (ref 80.0–100.0)
Platelets: 985 10*3/uL (ref 150–400)
RBC: 2.84 MIL/uL — ABNORMAL LOW (ref 4.22–5.81)
RDW: 14.7 % (ref 11.5–15.5)
WBC: 21.5 10*3/uL — AB (ref 4.0–10.5)
nRBC: 1.1 % — ABNORMAL HIGH (ref 0.0–0.2)

## 2019-01-02 LAB — GLUCOSE, CAPILLARY
Glucose-Capillary: 98 mg/dL (ref 70–99)
Glucose-Capillary: 113 mg/dL — ABNORMAL HIGH (ref 70–99)
Glucose-Capillary: 123 mg/dL — ABNORMAL HIGH (ref 70–99)
Glucose-Capillary: 104 mg/dL — ABNORMAL HIGH (ref 70–99)
Glucose-Capillary: 103 mg/dL — ABNORMAL HIGH (ref 70–99)
Glucose-Capillary: 107 mg/dL — ABNORMAL HIGH (ref 70–99)

## 2019-01-02 LAB — BASIC METABOLIC PANEL
Anion gap: 12 (ref 5–15)
BUN: 17 mg/dL (ref 6–20)
CALCIUM: 8.7 mg/dL — AB (ref 8.9–10.3)
CO2: 27 mmol/L (ref 22–32)
Chloride: 102 mmol/L (ref 98–111)
Creatinine, Ser: 0.8 mg/dL (ref 0.61–1.24)
GFR calc Af Amer: >60 mL/min (ref >60)
GFR calc non Af Amer: >60 mL/min (ref >60)
Glucose, Bld: 138 mg/dL — ABNORMAL HIGH (ref 70–99)
Potassium: 4.3 mmol/L (ref 3.5–5.1)
Sodium: 141 mmol/L (ref 135–145)

## 2019-01-02 LAB — POCT I-STAT 7, (LYTES, BLD GAS, ICA,H+H)
Acid-Base Excess: 6 mmol/L — ABNORMAL HIGH (ref 0.0–2.0)
Bicarbonate: 29.6 mmol/L — ABNORMAL HIGH (ref 20.0–28.0)
Calcium, Ion: 1.17 mmol/L (ref 1.15–1.40)
HCT: 23 % — ABNORMAL LOW (ref 39.0–52.0)
Hemoglobin: 7.8 g/dL — ABNORMAL LOW (ref 13.0–17.0)
O2 Saturation: 90 %
PH ART: 7.479 — AB (ref 7.350–7.450)
Potassium: 4.3 mmol/L (ref 3.5–5.1)
Sodium: 139 mmol/L (ref 135–145)
TCO2: 31 mmol/L (ref 22–32)
pCO2 arterial: 39.9 mmHg (ref 32.0–48.0)
pO2, Arterial: 55 mmHg — ABNORMAL LOW (ref 83.0–108.0)

## 2019-01-02 NOTE — Progress Notes (Signed)
Pt extremely agitated, trying to pull ETT out, RR in 40s, tachycardic in 140s. Bolused,Returned to full support on vent. Vital signs returned to baseline.
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
## 2019-01-03 LAB — POCT I-STAT 7, (LYTES, BLD GAS, ICA,H+H)
Acid-Base Excess: 4 mmol/L — ABNORMAL HIGH (ref 0.0–2.0)
Bicarbonate: 29.8 mmol/L — ABNORMAL HIGH (ref 20.0–28.0)
Calcium, Ion: 1.22 mmol/L (ref 1.15–1.40)
HCT: 36 % — ABNORMAL LOW (ref 39.0–52.0)
Hemoglobin: 12.2 g/dL — ABNORMAL LOW (ref 13.0–17.0)
O2 Saturation: 96 %
Patient temperature: 98.6
Potassium: 3.9 mmol/L (ref 3.5–5.1)
Sodium: 139 mmol/L (ref 135–145)
TCO2: 31 mmol/L (ref 22–32)
pCO2 arterial: 46.9 mmHg (ref 32.0–48.0)
pH, Arterial: 7.411 (ref 7.350–7.450)
pO2, Arterial: 84 mmHg (ref 83.0–108.0)

## 2019-01-03 LAB — GLUCOSE, CAPILLARY
Glucose-Capillary: 96 mg/dL (ref 70–99)
Glucose-Capillary: 111 mg/dL — ABNORMAL HIGH (ref 70–99)
Glucose-Capillary: 131 mg/dL — ABNORMAL HIGH (ref 70–99)
Glucose-Capillary: 125 mg/dL — ABNORMAL HIGH (ref 70–99)
Glucose-Capillary: 114 mg/dL — ABNORMAL HIGH (ref 70–99)
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
## 2019-01-04 LAB — CBC
HEMATOCRIT: 25.9 % — AB (ref 39.0–52.0)
Hemoglobin: 8 g/dL — ABNORMAL LOW (ref 13.0–17.0)
MCH: 29.6 pg (ref 26.0–34.0)
MCHC: 30.9 g/dL (ref 30.0–36.0)
MCV: 95.9 fL (ref 80.0–100.0)
Platelets: 980 10*3/uL (ref 150–400)
RBC: 2.7 MIL/uL — ABNORMAL LOW (ref 4.22–5.81)
RDW: 14.8 % (ref 11.5–15.5)
WBC: 21.4 10*3/uL — ABNORMAL HIGH (ref 4.0–10.5)
nRBC: 1.1 % — ABNORMAL HIGH (ref 0.0–0.2)

## 2019-01-04 LAB — GLUCOSE, CAPILLARY
Glucose-Capillary: 133 mg/dL — ABNORMAL HIGH (ref 70–99)
Glucose-Capillary: 118 mg/dL — ABNORMAL HIGH (ref 70–99)
Glucose-Capillary: 120 mg/dL — ABNORMAL HIGH (ref 70–99)
Glucose-Capillary: 107 mg/dL — ABNORMAL HIGH (ref 70–99)
Glucose-Capillary: 99 mg/dL (ref 70–99)
Glucose-Capillary: 125 mg/dL — ABNORMAL HIGH (ref 70–99)

## 2019-01-04 LAB — BASIC METABOLIC PANEL
Anion gap: 11 (ref 5–15)
BUN: 15 mg/dL (ref 6–20)
CO2: 28 mmol/L (ref 22–32)
Calcium: 8.4 mg/dL — ABNORMAL LOW (ref 8.9–10.3)
Chloride: 99 mmol/L (ref 98–111)
Creatinine, Ser: 0.63 mg/dL (ref 0.61–1.24)
GFR calc Af Amer: >60 mL/min (ref >60)
GFR calc non Af Amer: >60 mL/min (ref >60)
Glucose, Bld: 155 mg/dL — ABNORMAL HIGH (ref 70–99)
Potassium: 4.4 mmol/L (ref 3.5–5.1)
Sodium: 138 mmol/L (ref 135–145)

## 2019-01-04 NOTE — Plan of Care (Signed)
  Problem: Pain Managment: Goal: General experience of comfort will improve Outcome: Progressing   
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
## 2019-01-05 ENCOUNTER — Inpatient Hospital Stay (HOSPITAL_COMMUNITY): Payer: Medicaid Other

## 2019-01-05 LAB — GLUCOSE, CAPILLARY
GLUCOSE-CAPILLARY: 103 mg/dL — AB (ref 70–99)
GLUCOSE-CAPILLARY: 118 mg/dL — AB (ref 70–99)
GLUCOSE-CAPILLARY: 119 mg/dL — AB (ref 70–99)
Glucose-Capillary: 98 mg/dL (ref 70–99)
Glucose-Capillary: 168 mg/dL — ABNORMAL HIGH (ref 70–99)
Glucose-Capillary: 111 mg/dL — ABNORMAL HIGH (ref 70–99)

## 2019-01-05 MED ORDER — METOPROLOL TARTRATE 25 MG/10 ML ORAL SUSPENSION
25.0000 mg | Freq: Two times a day (BID) | ORAL | Status: DC
  Administered 2019-01-05 – 2019-02-02 (×32): 25 mg
  Filled 2019-01-05 (×40): qty 10

## 2019-01-05 NOTE — Progress Notes (Signed)
Ortho Trauma Progress Note  Patient remains stable from orthopedic standpoint. Incision healing well. No erythema or signs of infection. Gentle range of motion shows no grimacing on the patient's face. +radial pulse with brisk cap refill. X-rays of left elbow ordered today, will see results of these and may discontinue immobilization. Plan to remove sutures today.  Continue nonweightbearing precautions to left upper extremity.   Hunter Cook A. [facility] ?[phone]? (phone)

## 2019-01-05 NOTE — Progress Notes (Signed)
LUE sutures removed per order
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
## 2019-01-06 LAB — BASIC METABOLIC PANEL
Anion gap: 10 (ref 5–15)
BUN: 15 mg/dL (ref 6–20)
CALCIUM: 8.7 mg/dL — AB (ref 8.9–10.3)
CO2: 30 mmol/L (ref 22–32)
Chloride: 100 mmol/L (ref 98–111)
Creatinine, Ser: 0.53 mg/dL — ABNORMAL LOW (ref 0.61–1.24)
GFR calc Af Amer: >60 mL/min (ref >60)
GFR calc non Af Amer: >60 mL/min (ref >60)
GLUCOSE: 124 mg/dL — AB (ref 70–99)
Potassium: 3.9 mmol/L (ref 3.5–5.1)
Sodium: 140 mmol/L (ref 135–145)

## 2019-01-06 LAB — GLUCOSE, CAPILLARY
Glucose-Capillary: 112 mg/dL — ABNORMAL HIGH (ref 70–99)
Glucose-Capillary: 114 mg/dL — ABNORMAL HIGH (ref 70–99)
Glucose-Capillary: 115 mg/dL — ABNORMAL HIGH (ref 70–99)
Glucose-Capillary: 157 mg/dL — ABNORMAL HIGH (ref 70–99)
Glucose-Capillary: 87 mg/dL (ref 70–99)
Glucose-Capillary: 98 mg/dL (ref 70–99)

## 2019-01-06 LAB — CBC
HCT: 25.3 % — ABNORMAL LOW (ref 39.0–52.0)
HEMOGLOBIN: 7.8 g/dL — AB (ref 13.0–17.0)
MCH: 29 pg (ref 26.0–34.0)
MCHC: 30.8 g/dL (ref 30.0–36.0)
MCV: 94.1 fL (ref 80.0–100.0)
Platelets: 935 10*3/uL (ref 150–400)
RBC: 2.69 MIL/uL — ABNORMAL LOW (ref 4.22–5.81)
RDW: 15.6 % — ABNORMAL HIGH (ref 11.5–15.5)
WBC: 18.5 10*3/uL — ABNORMAL HIGH (ref 4.0–10.5)
nRBC: 0.5 % — ABNORMAL HIGH (ref 0.0–0.2)
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
## 2019-01-07 LAB — BASIC METABOLIC PANEL
Anion gap: 9 (ref 5–15)
BUN: 17 mg/dL (ref 6–20)
CO2: 29 mmol/L (ref 22–32)
Calcium: 8.8 mg/dL — ABNORMAL LOW (ref 8.9–10.3)
Chloride: 100 mmol/L (ref 98–111)
Creatinine, Ser: 0.52 mg/dL — ABNORMAL LOW (ref 0.61–1.24)
GFR calc Af Amer: >60 mL/min (ref >60)
GFR calc non Af Amer: >60 mL/min (ref >60)
Glucose, Bld: 127 mg/dL — ABNORMAL HIGH (ref 70–99)
Potassium: 4 mmol/L (ref 3.5–5.1)
Sodium: 138 mmol/L (ref 135–145)

## 2019-01-07 LAB — CBC
HCT: 25.6 % — ABNORMAL LOW (ref 39.0–52.0)
Hemoglobin: 7.9 g/dL — ABNORMAL LOW (ref 13.0–17.0)
MCH: 28.6 pg (ref 26.0–34.0)
MCHC: 30.9 g/dL (ref 30.0–36.0)
MCV: 92.8 fL (ref 80.0–100.0)
NRBC: 0.7 % — AB (ref 0.0–0.2)
Platelets: 944 10*3/uL (ref 150–400)
RBC: 2.76 MIL/uL — ABNORMAL LOW (ref 4.22–5.81)
RDW: 15.6 % — ABNORMAL HIGH (ref 11.5–15.5)
WBC: 14.8 10*3/uL — ABNORMAL HIGH (ref 4.0–10.5)

## 2019-01-07 LAB — GLUCOSE, CAPILLARY
GLUCOSE-CAPILLARY: 111 mg/dL — AB (ref 70–99)
GLUCOSE-CAPILLARY: 112 mg/dL — AB (ref 70–99)
Glucose-Capillary: 103 mg/dL — ABNORMAL HIGH (ref 70–99)
Glucose-Capillary: 97 mg/dL (ref 70–99)
Glucose-Capillary: 122 mg/dL — ABNORMAL HIGH (ref 70–99)
Glucose-Capillary: 114 mg/dL — ABNORMAL HIGH (ref 70–99)

## 2019-01-07 NOTE — Progress Notes (Signed)
Pt became tachycardic and tachypnea.  RN reposition and increase sedation, but pt continue to be agitated.  Pt oxygen saturation around 88%.  RT notified and pt put back on full support.  RN will continue to monitor.
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
## 2019-01-08 ENCOUNTER — Inpatient Hospital Stay (HOSPITAL_COMMUNITY): Payer: Medicaid Other

## 2019-01-08 LAB — GLUCOSE, CAPILLARY
GLUCOSE-CAPILLARY: 100 mg/dL — AB (ref 70–99)
Glucose-Capillary: 95 mg/dL (ref 70–99)
Glucose-Capillary: 119 mg/dL — ABNORMAL HIGH (ref 70–99)
Glucose-Capillary: 112 mg/dL — ABNORMAL HIGH (ref 70–99)
Glucose-Capillary: 122 mg/dL — ABNORMAL HIGH (ref 70–99)
Glucose-Capillary: 107 mg/dL — ABNORMAL HIGH (ref 70–99)
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
## 2019-01-09 ENCOUNTER — Inpatient Hospital Stay (HOSPITAL_COMMUNITY): Payer: Medicaid Other | Admitting: Critical Care Medicine

## 2019-01-09 ENCOUNTER — Inpatient Hospital Stay (HOSPITAL_COMMUNITY): Payer: Medicaid Other

## 2019-01-09 LAB — BASIC METABOLIC PANEL
Anion gap: 10 (ref 5–15)
BUN: 16 mg/dL (ref 6–20)
CO2: 30 mmol/L (ref 22–32)
Calcium: 9.1 mg/dL (ref 8.9–10.3)
Chloride: 99 mmol/L (ref 98–111)
Creatinine, Ser: 0.45 mg/dL — ABNORMAL LOW (ref 0.61–1.24)
GFR calc Af Amer: >60 mL/min (ref >60)
GFR calc non Af Amer: >60 mL/min (ref >60)
Glucose, Bld: 117 mg/dL — ABNORMAL HIGH (ref 70–99)
Potassium: 4 mmol/L (ref 3.5–5.1)
Sodium: 139 mmol/L (ref 135–145)

## 2019-01-09 LAB — CBC
HEMATOCRIT: 27 % — AB (ref 39.0–52.0)
Hemoglobin: 8.1 g/dL — ABNORMAL LOW (ref 13.0–17.0)
MCH: 27.6 pg (ref 26.0–34.0)
MCHC: 30 g/dL (ref 30.0–36.0)
MCV: 91.8 fL (ref 80.0–100.0)
Platelets: 903 10*3/uL (ref 150–400)
RBC: 2.94 MIL/uL — ABNORMAL LOW (ref 4.22–5.81)
RDW: 15.6 % — ABNORMAL HIGH (ref 11.5–15.5)
WBC: 11.3 10*3/uL — ABNORMAL HIGH (ref 4.0–10.5)
nRBC: 0.4 % — ABNORMAL HIGH (ref 0.0–0.2)

## 2019-01-09 LAB — GLUCOSE, CAPILLARY
GLUCOSE-CAPILLARY: 118 mg/dL — AB (ref 70–99)
Glucose-Capillary: 113 mg/dL — ABNORMAL HIGH (ref 70–99)
Glucose-Capillary: 133 mg/dL — ABNORMAL HIGH (ref 70–99)
Glucose-Capillary: 89 mg/dL (ref 70–99)
Glucose-Capillary: 92 mg/dL (ref 70–99)
Glucose-Capillary: 99 mg/dL (ref 70–99)

## 2019-01-09 MED ORDER — SUCCINYLCHOLINE CHLORIDE 20 MG/ML IJ SOLN
INTRAMUSCULAR | Status: DC | PRN
  Administered 2019-01-09: 120 mg via INTRAVENOUS

## 2019-01-09 MED ORDER — PROPOFOL 10 MG/ML IV BOLUS
INTRAVENOUS | Status: DC | PRN
  Administered 2019-01-09: 150 mg via INTRAVENOUS
  Administered 2019-01-09: 50 mg via INTRAVENOUS

## 2019-01-09 NOTE — Procedures (Signed)
Extubation Procedure Note  Patient Details:   Name: OATHER GONSALVES DOB: 02/01/1996 MRN: [id number]   Airway Documentation:  Airway 7.5 mm (Active)  Secured at (cm) 22 cm 01/09/2019 11:50 AM  Measured From Lips 01/09/2019 11:50 AM  Secured Location Right 01/09/2019 11:50 AM  Secured By [employer] 01/09/2019 11:50 AM  Site Condition Dry 01/09/2019 11:50 AM   Vent end date: 01/09/19 Vent end time: 1055   Evaluation  O2 sats: stable throughout Complications: No apparent complications Patient did tolerate procedure well. Bilateral Breath Sounds: Rhonchi   No   Patient extubated to 3L [place] with RN and MD at bedside. Positive cuff leak. Moderate amount of secretions and rhonchi. Will continue to monitor. Patient able to cough some secretions up.  Rance Muir 01/09/2019, 12:42 PM

## 2019-01-09 NOTE — Anesthesia Procedure Notes (Addendum)
Procedure Name: Intubation Date/Time: 01/09/2019 11:37 AM Performed by: Rachel Moulds, CRNA Pre-anesthesia Checklist: Patient identified, Emergency Drugs available, Suction available, Timeout performed and Patient being monitored Patient Re-evaluated:Patient Re-evaluated prior to induction Oxygen Delivery Method: Ambu bag Preoxygenation: Pre-oxygenation with 100% oxygen Induction Type: IV induction Ventilation: Mask ventilation without difficulty Grade View: Grade I Tube type: Oral Tube size: 7.5 mm Number of attempts: 1 Airway Equipment and Method: Stylet and Video-laryngoscopy Placement Confirmation: ETT inserted through vocal cords under direct vision,  positive ETCO2,  CO2 detector and breath sounds checked- equal and bilateral Secured at: 23 cm Tube secured with: Tape Dental Injury: Teeth and Oropharynx as per pre-operative assessment  Comments: Pt in C Collar.  Head and neck in neutral position during intubation.

## 2019-01-09 NOTE — Progress Notes (Signed)
Pt desats into 80's and is restless. HR in the 130's sustaining. MD at bedside. Anaesthesia paged. They are at bedside to re-intubate. Hagen Tidd, [place] Scrape, RN
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
## 2019-01-09 NOTE — Progress Notes (Signed)
Patient extubated and placed on nasal cannula. He will follow simple commands but will not verbalize orientation questions at this time. He's restless. Bed alarm activated and floor mats in place. Jatasia Gundrum, [place] Scrape, RN
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
## 2019-01-10 LAB — GLUCOSE, CAPILLARY
Glucose-Capillary: 109 mg/dL — ABNORMAL HIGH (ref 70–99)
Glucose-Capillary: 111 mg/dL — ABNORMAL HIGH (ref 70–99)
Glucose-Capillary: 117 mg/dL — ABNORMAL HIGH (ref 70–99)
Glucose-Capillary: 85 mg/dL (ref 70–99)
Glucose-Capillary: 97 mg/dL (ref 70–99)
Glucose-Capillary: 99 mg/dL (ref 70–99)
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
## 2019-01-11 LAB — GLUCOSE, CAPILLARY
Glucose-Capillary: 115 mg/dL — ABNORMAL HIGH (ref 70–99)
Glucose-Capillary: 117 mg/dL — ABNORMAL HIGH (ref 70–99)
Glucose-Capillary: 114 mg/dL — ABNORMAL HIGH (ref 70–99)
Glucose-Capillary: 110 mg/dL — ABNORMAL HIGH (ref 70–99)
Glucose-Capillary: 106 mg/dL — ABNORMAL HIGH (ref 70–99)
Glucose-Capillary: 112 mg/dL — ABNORMAL HIGH (ref 70–99)

## 2019-01-11 LAB — BASIC METABOLIC PANEL
Anion gap: 9 (ref 5–15)
BUN: 13 mg/dL (ref 6–20)
CO2: 28 mmol/L (ref 22–32)
Calcium: 8.4 mg/dL — ABNORMAL LOW (ref 8.9–10.3)
Chloride: 102 mmol/L (ref 98–111)
Creatinine, Ser: 0.37 mg/dL — ABNORMAL LOW (ref 0.61–1.24)
GFR calc Af Amer: >60 mL/min (ref >60)
GFR calc non Af Amer: >60 mL/min (ref >60)
Glucose, Bld: 110 mg/dL — ABNORMAL HIGH (ref 70–99)
Potassium: 3.5 mmol/L (ref 3.5–5.1)
Sodium: 139 mmol/L (ref 135–145)

## 2019-01-11 LAB — CBC WITH DIFFERENTIAL/PLATELET
Abs Immature Granulocytes: 0.05 10*3/uL (ref 0.00–0.07)
Basophils Absolute: 0.2 10*3/uL — ABNORMAL HIGH (ref 0.0–0.1)
Basophils Relative: 1 %
Eosinophils Absolute: 1 10*3/uL — ABNORMAL HIGH (ref 0.0–0.5)
Eosinophils Relative: 8 %
HCT: 26 % — ABNORMAL LOW (ref 39.0–52.0)
Hemoglobin: 8 g/dL — ABNORMAL LOW (ref 13.0–17.0)
IMMATURE GRANULOCYTES: 0 %
Lymphocytes Relative: 21 %
Lymphs Abs: 2.5 10*3/uL (ref 0.7–4.0)
MCH: 27.6 pg (ref 26.0–34.0)
MCHC: 30.8 g/dL (ref 30.0–36.0)
MCV: 89.7 fL (ref 80.0–100.0)
Monocytes Absolute: 1.1 10*3/uL — ABNORMAL HIGH (ref 0.1–1.0)
Monocytes Relative: 9 %
NEUTROS PCT: 61 %
Neutro Abs: 7.4 10*3/uL (ref 1.7–7.7)
Platelets: 826 10*3/uL — ABNORMAL HIGH (ref 150–400)
RBC: 2.9 MIL/uL — ABNORMAL LOW (ref 4.22–5.81)
RDW: 14.9 % (ref 11.5–15.5)
WBC: 12.1 10*3/uL — ABNORMAL HIGH (ref 4.0–10.5)
nRBC: 0 % (ref 0.0–0.2)
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
## 2019-01-12 LAB — GLUCOSE, CAPILLARY
Glucose-Capillary: 102 mg/dL — ABNORMAL HIGH (ref 70–99)
Glucose-Capillary: 111 mg/dL — ABNORMAL HIGH (ref 70–99)
Glucose-Capillary: 116 mg/dL — ABNORMAL HIGH (ref 70–99)
Glucose-Capillary: 116 mg/dL — ABNORMAL HIGH (ref 70–99)
Glucose-Capillary: 93 mg/dL (ref 70–99)
Glucose-Capillary: 98 mg/dL (ref 70–99)

## 2019-01-12 LAB — BASIC METABOLIC PANEL
ANION GAP: 7 (ref 5–15)
BUN: 13 mg/dL (ref 6–20)
CO2: 28 mmol/L (ref 22–32)
Calcium: 8.2 mg/dL — ABNORMAL LOW (ref 8.9–10.3)
Chloride: 103 mmol/L (ref 98–111)
Creatinine, Ser: 0.45 mg/dL — ABNORMAL LOW (ref 0.61–1.24)
GFR calc Af Amer: >60 mL/min (ref >60)
GFR calc non Af Amer: >60 mL/min (ref >60)
Glucose, Bld: 119 mg/dL — ABNORMAL HIGH (ref 70–99)
POTASSIUM: 3.6 mmol/L (ref 3.5–5.1)
Sodium: 138 mmol/L (ref 135–145)

## 2019-01-12 LAB — CBC WITH DIFFERENTIAL/PLATELET
Abs Immature Granulocytes: 0.05 10*3/uL (ref 0.00–0.07)
Basophils Absolute: 0.2 10*3/uL — ABNORMAL HIGH (ref 0.0–0.1)
Basophils Relative: 1 %
EOS ABS: 1.1 10*3/uL — AB (ref 0.0–0.5)
EOS PCT: 8 %
HCT: 27.2 % — ABNORMAL LOW (ref 39.0–52.0)
HEMOGLOBIN: 8.4 g/dL — AB (ref 13.0–17.0)
Immature Granulocytes: 0 %
Lymphocytes Relative: 19 %
Lymphs Abs: 2.5 10*3/uL (ref 0.7–4.0)
MCH: 27.5 pg (ref 26.0–34.0)
MCHC: 30.9 g/dL (ref 30.0–36.0)
MCV: 89.2 fL (ref 80.0–100.0)
Monocytes Absolute: 1.2 10*3/uL — ABNORMAL HIGH (ref 0.1–1.0)
Monocytes Relative: 9 %
Neutro Abs: 8.3 10*3/uL — ABNORMAL HIGH (ref 1.7–7.7)
Neutrophils Relative %: 63 %
Platelets: 906 10*3/uL (ref 150–400)
RBC: 3.05 MIL/uL — ABNORMAL LOW (ref 4.22–5.81)
RDW: 15.2 % (ref 11.5–15.5)
WBC: 13.3 10*3/uL — ABNORMAL HIGH (ref 4.0–10.5)
nRBC: 0 % (ref 0.0–0.2)

## 2019-01-12 MED ORDER — CEFAZOLIN SODIUM-DEXTROSE 2-4 GM/100ML-% IV SOLN
2.0000 g | Freq: Once | INTRAVENOUS | Status: AC
  Administered 2019-01-13: 2 g via INTRAVENOUS
  Filled 2019-01-12: qty 100
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
## 2019-01-12 NOTE — Progress Notes (Signed)
Patient ID: Hunter Cook, male   DOB: Mar 30, 1996, 23 y.o.   MRN: [id number] I spoke with his father, Hunter Cook, on the phone and updated him on [name]'s progress and answered his questions. I also discussed the tracheostomy planned tomorrow including the procedure, risks, and benefits. He is agreeable. Consent documented.  Violeta Gelinas, MD, MPH, FACS Trauma: [phone] General Surgery: [phone]

## 2019-01-13 ENCOUNTER — Inpatient Hospital Stay (HOSPITAL_COMMUNITY): Payer: Medicaid Other

## 2019-01-13 ENCOUNTER — Inpatient Hospital Stay (HOSPITAL_COMMUNITY): Payer: Medicaid Other | Admitting: Certified Registered Nurse Anesthetist

## 2019-01-13 ENCOUNTER — Encounter (HOSPITAL_COMMUNITY): Admission: EM | Disposition: A | Discharge status: Rehabilitation Facility | Payer: Self-pay | Source: Home / Self Care

## 2019-01-13 ENCOUNTER — Encounter (HOSPITAL_COMMUNITY): Payer: Self-pay | Admitting: Certified Registered Nurse Anesthetist

## 2019-01-13 HISTORY — PX: TRACHEOSTOMY TUBE PLACEMENT: SHX814

## 2019-01-13 LAB — BASIC METABOLIC PANEL
Anion gap: 9 (ref 5–15)
BUN: 16 mg/dL (ref 6–20)
CHLORIDE: 101 mmol/L (ref 98–111)
CO2: 27 mmol/L (ref 22–32)
Calcium: 8.5 mg/dL — ABNORMAL LOW (ref 8.9–10.3)
Creatinine, Ser: 0.51 mg/dL — ABNORMAL LOW (ref 0.61–1.24)
GFR calc Af Amer: >60 mL/min (ref >60)
GFR calc non Af Amer: >60 mL/min (ref >60)
Glucose, Bld: 104 mg/dL — ABNORMAL HIGH (ref 70–99)
Potassium: 3.4 mmol/L — ABNORMAL LOW (ref 3.5–5.1)
Sodium: 137 mmol/L (ref 135–145)

## 2019-01-13 LAB — CBC
HCT: 27.3 % — ABNORMAL LOW (ref 39.0–52.0)
Hemoglobin: 8.5 g/dL — ABNORMAL LOW (ref 13.0–17.0)
MCH: 27.8 pg (ref 26.0–34.0)
MCHC: 31.1 g/dL (ref 30.0–36.0)
MCV: 89.2 fL (ref 80.0–100.0)
Platelets: 822 10*3/uL — ABNORMAL HIGH (ref 150–400)
RBC: 3.06 MIL/uL — ABNORMAL LOW (ref 4.22–5.81)
RDW: 15.3 % (ref 11.5–15.5)
WBC: 20 10*3/uL — ABNORMAL HIGH (ref 4.0–10.5)
nRBC: 0 % (ref 0.0–0.2)

## 2019-01-13 LAB — GLUCOSE, CAPILLARY
GLUCOSE-CAPILLARY: 90 mg/dL (ref 70–99)
Glucose-Capillary: 98 mg/dL (ref 70–99)
Glucose-Capillary: 97 mg/dL (ref 70–99)
Glucose-Capillary: 91 mg/dL (ref 70–99)
Glucose-Capillary: 82 mg/dL (ref 70–99)
Glucose-Capillary: 85 mg/dL (ref 70–99)

## 2019-01-13 SURGERY — CREATION, TRACHEOSTOMY
Anesthesia: General | Site: Neck

## 2019-01-13 MED ORDER — PROPOFOL 10 MG/ML IV BOLUS
INTRAVENOUS | Status: AC
  Filled 2019-01-13: qty 20

## 2019-01-13 MED ORDER — ROCURONIUM BROMIDE 10 MG/ML (PF) SYRINGE
PREFILLED_SYRINGE | INTRAVENOUS | Status: DC | PRN
  Administered 2019-01-13 (×2): 50 mg via INTRAVENOUS

## 2019-01-13 MED ORDER — FENTANYL CITRATE (PF) 250 MCG/5ML IJ SOLN
INTRAMUSCULAR | Status: AC
  Filled 2019-01-13: qty 5

## 2019-01-13 MED ORDER — VECURONIUM BROMIDE 10 MG IV SOLR
INTRAVENOUS | Status: AC
  Administered 2019-01-13: 10 mg
  Filled 2019-01-13: qty 10

## 2019-01-13 MED ORDER — SODIUM CHLORIDE 0.9 % IV SOLN
INTRAVENOUS | Status: DC | PRN
  Administered 2019-01-13: 09:00:00 via INTRAVENOUS

## 2019-01-13 MED ORDER — ALBUMIN HUMAN 5 % IV SOLN
25.0000 g | Freq: Once | INTRAVENOUS | Status: AC
  Administered 2019-01-13: 25 g via INTRAVENOUS
  Filled 2019-01-13: qty 500

## 2019-01-13 MED ORDER — FENTANYL CITRATE (PF) 100 MCG/2ML IJ SOLN
INTRAMUSCULAR | Status: DC | PRN
  Administered 2019-01-13: 100 ug via INTRAVENOUS

## 2019-01-13 MED ORDER — ROCURONIUM BROMIDE 50 MG/5ML IV SOSY
PREFILLED_SYRINGE | INTRAVENOUS | Status: AC
  Filled 2019-01-13: qty 5

## 2019-01-13 MED ORDER — ONDANSETRON HCL 4 MG/2ML IJ SOLN
INTRAMUSCULAR | Status: AC
  Filled 2019-01-13: qty 2

## 2019-01-13 MED ORDER — SUCCINYLCHOLINE CHLORIDE 200 MG/10ML IV SOSY
PREFILLED_SYRINGE | INTRAVENOUS | Status: AC
  Filled 2019-01-13: qty 10

## 2019-01-13 MED ORDER — VECURONIUM BROMIDE 10 MG IV SOLR
10.0000 mg | Freq: Once | INTRAVENOUS | Status: DC
  Filled 2019-01-13: qty 10

## 2019-01-13 MED ORDER — MIDAZOLAM HCL 2 MG/2ML IJ SOLN
INTRAMUSCULAR | Status: AC
  Filled 2019-01-13: qty 2

## 2019-01-13 MED ORDER — 0.9 % SODIUM CHLORIDE (POUR BTL) OPTIME
TOPICAL | Status: DC | PRN
  Administered 2019-01-13: 1000 mL

## 2019-01-13 MED ORDER — POTASSIUM CHLORIDE 10 MEQ/50ML IV SOLN
10.0000 meq | INTRAVENOUS | Status: AC
  Administered 2019-01-13 (×2): 10 meq via INTRAVENOUS
  Filled 2019-01-13 (×2): qty 50

## 2019-01-13 SURGICAL SUPPLY — 32 items
BLADE CLIPPER SURG (BLADE) IMPLANT
CANISTER SUCT 3000ML PPV (MISCELLANEOUS) ×3 IMPLANT
COVER SURGICAL LIGHT HANDLE (MISCELLANEOUS) ×3 IMPLANT
COVER WAND RF STERILE (DRAPES) ×3 IMPLANT
DRAPE UTILITY XL STRL (DRAPES) ×3 IMPLANT
ELECT CAUTERY BLADE 6.4 (BLADE) ×3 IMPLANT
ELECT REM PT RETURN 9FT ADLT (ELECTROSURGICAL) ×3
ELECTRODE REM PT RTRN 9FT ADLT (ELECTROSURGICAL) ×1 IMPLANT
GAUZE 4X4 16PLY RFD (DISPOSABLE) ×3 IMPLANT
GLOVE BIO SURGEON STRL SZ8 (GLOVE) ×3 IMPLANT
GLOVE BIOGEL PI IND STRL 8 (GLOVE) ×1 IMPLANT
GLOVE BIOGEL PI INDICATOR 8 (GLOVE) ×2
GOWN STRL REUS W/ TWL LRG LVL3 (GOWN DISPOSABLE) ×1 IMPLANT
GOWN STRL REUS W/ TWL XL LVL3 (GOWN DISPOSABLE) ×1 IMPLANT
GOWN STRL REUS W/TWL LRG LVL3 (GOWN DISPOSABLE) ×2
GOWN STRL REUS W/TWL XL LVL3 (GOWN DISPOSABLE) ×2
INTRODUCER TRACH BLUE RHINO 6F (TUBING) IMPLANT
INTRODUCER TRACH BLUE RHINO 8F (TUBING) IMPLANT
KIT BASIN OR (CUSTOM PROCEDURE TRAY) ×3 IMPLANT
KIT TURNOVER KIT B (KITS) ×3 IMPLANT
NS IRRIG 1000ML POUR BTL (IV SOLUTION) ×3 IMPLANT
PACK EENT II TURBAN DRAPE (CUSTOM PROCEDURE TRAY) ×3 IMPLANT
PAD ARMBOARD 7.5X6 YLW CONV (MISCELLANEOUS) ×6 IMPLANT
PENCIL BUTTON HOLSTER BLD 10FT (ELECTRODE) ×3 IMPLANT
SPONGE DRAIN TRACH 4X4 STRL 2S (GAUZE/BANDAGES/DRESSINGS) ×3 IMPLANT
SPONGE INTESTINAL PEANUT (DISPOSABLE) ×3 IMPLANT
SUT VICRYL AB 3 0 TIES (SUTURE) ×3 IMPLANT
SYR 20CC LL (SYRINGE) ×3 IMPLANT
TOWEL OR 17X24 6PK STRL BLUE (TOWEL DISPOSABLE) ×3 IMPLANT
TOWEL OR 17X26 10 PK STRL BLUE (TOWEL DISPOSABLE) ×3 IMPLANT
TUBE CONNECTING 12'X1/4 (SUCTIONS) ×1
TUBE CONNECTING 12X1/4 (SUCTIONS) ×2 IMPLANT

## 2019-01-13 NOTE — Progress Notes (Signed)
Patient with increased agitation, attempting to pull tubes and tracheostomy out. With increase of medication and PRN, patient has no relief. Dr. Georg Ruddle made aware, with new orders received. Will continue to monitor. Dicie Beam RN BSN.

## 2019-01-13 NOTE — Progress Notes (Signed)
Per MD Byerly to titrate PEEP up to 14cmh2o if needed, long as no hemodynamic instability noted. Patient is currently on 10cmh20, will increase as needed if tolerated. RN aware.

## 2019-01-13 NOTE — Progress Notes (Signed)
Trach care done per RRT. Patient tolerating it fairly well.
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
## 2019-01-13 NOTE — Progress Notes (Signed)
Patient manually ventilated bagged lavage due to oxygen desaturations and very thick secretions noted upcoming up the ETT. After bagged lavage 100%, patient was suctioned got back massive copious amounts of thick yellow/tan secretions on several passes of the suction catheter. Secretions and mucous plugs noted. Tolerated suctioning fairly well. Patient SATs still remains unstable. RRT at bedside and will notify MD.

## 2019-01-13 NOTE — Op Note (Signed)
12/19/2018 - 01/13/2019  10:01 AM  PATIENT:  Hunter Cook  23 y.o. male  PRE-OPERATIVE DIAGNOSIS: Ventilator dependence  POST-OPERATIVE DIAGNOSIS: Ventilator dependence  PROCEDURE:  Procedure(s): TRACHEOSTOMY #6 SHILEY  SURGEON:  Surgeon(s): Violeta Gelinas, MD  ASSISTANTS: Leary Roca, PA-C   ANESTHESIA:   local and general  EBL:  Total I/O In: 212.7 [I.V.:154.2; IV Piggyback:58.5] Out: 400 [Urine:400]  BLOOD ADMINISTERED:none  DRAINS: none   SPECIMEN:  No Specimen  DISPOSITION OF SPECIMEN:  N/A  COUNTS:  YES  DICTATION: .Dragon Dictation Procedure in detail: Informed consent was obtained.  He received intravenous antibiotics.  He was brought to the operating room directly from the trauma neuro intensive care unit on the ventilator.  His neck was stabilized and taped in neutral position.  His anterior cervical collar was removed.  His neck was prepped and draped in sterile fashion.  Timeout procedure was performed.  Local was injected.  Transverse incision incision was made 2 cm cephalad to the sternal notch along the midline.  Subcutaneous tissues were dissected down.  We identified the strap muscles and split them.  The anterior trachea was exposed.  The endotracheal tube was withdrawn under direct palpation.  Angiocath was then inserted between the second and third tracheal ring followed by guidewire.  The small dilator was placed followed by the large Blue Rhino dilator.  Next, a #6 Shiley tracheostomy tube was inserted over a 24 [place] dilator.  It passed easily.  The cuff was inflated and it was hooked up to the ventilator circuit and had good volumes.  A dressing was placed.  2-0 Prolene's were used to sutured to the skin on either side.  We placed a Velcro trach tie.  I also suctioned out his airway and he had a large number of secretions.  All counts were correct.  He tolerated the procedure without complication and was taken back to the trauma neuro intensive care  unit in critical condition on the ventilator. PATIENT DISPOSITION:  ICU - intubated and critically ill.   Delay start of Pharmacological VTE agent (>24hrs) due to surgical blood loss or risk of bleeding:  no  Violeta Gelinas, MD, MPH, FACS Pager: [phone]  2/18/202010:01 AM

## 2019-01-13 NOTE — Transfer of Care (Signed)
Immediate Anesthesia Transfer of Care Note  Patient: Hunter Cook  Procedure(s) Performed: TRACHEOSTOMY (N/A Neck)  Patient Location: ICU  Anesthesia Type:General  Level of Consciousness: sedated and patient cooperative  Airway & Oxygen Therapy: Patient remains intubated per anesthesia plan and Patient placed on Ventilator (see vital sign flow sheet for setting)  Post-op Assessment: Report given to RN and Post -op Vital signs reviewed and stable  Post vital signs: Reviewed and stable  Last Vitals:  Vitals Value Taken Time  BP 99/59 01/13/2019  1:15 PM  Temp    Pulse 82 01/13/2019  1:28 PM  Resp 16 01/13/2019  1:28 PM  SpO2 100 % 01/13/2019  1:28 PM  Vitals shown include unvalidated device data.  Last Pain:  Vitals:   01/13/19 1200  TempSrc: Axillary         Complications: No apparent anesthesia complications
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
## 2019-01-13 NOTE — Anesthesia Procedure Notes (Signed)
Date/Time: 01/13/2019 9:29 AM Performed by: Rogelia Boga, CRNA Pre-anesthesia Checklist: Patient identified, Emergency Drugs available, Suction available, Patient being monitored and Timeout performed Patient Re-evaluated:Patient Re-evaluated prior to induction Induction Type: Inhalational induction and Inhalational induction with existing ETT Placement Confirmation: positive ETCO2 and breath sounds checked- equal and bilateral Tube secured with: Tape

## 2019-01-13 NOTE — Progress Notes (Signed)
ET tube advanced, per order, from 25 cm to 27 cm.  Measured at the lip.

## 2019-01-13 NOTE — Anesthesia Postprocedure Evaluation (Signed)
Anesthesia Post Note  Patient: Hunter Cook  Procedure(s) Performed: TRACHEOSTOMY (N/A Neck)     Patient location during evaluation: PACU Anesthesia Type: General Level of consciousness: awake and alert Pain management: pain level controlled Vital Signs Assessment: post-procedure vital signs reviewed and stable Respiratory status: spontaneous breathing, nonlabored ventilation, respiratory function stable and patient connected to nasal cannula oxygen Cardiovascular status: blood pressure returned to baseline and stable Postop Assessment: no apparent nausea or vomiting Anesthetic complications: no    Last Vitals:  Vitals:   01/13/19 1200 01/13/19 1225  BP:  (!) 98/53  Pulse:  83  Resp:  16  Temp: 37.9 C   SpO2:  96%    Last Pain:  Vitals:   01/13/19 1200  TempSrc: Axillary                 Jacalyn Biggs

## 2019-01-14 ENCOUNTER — Encounter (HOSPITAL_COMMUNITY): Payer: Self-pay | Admitting: General Surgery

## 2019-01-14 LAB — BASIC METABOLIC PANEL
Anion gap: 11 (ref 5–15)
BUN: 14 mg/dL (ref 6–20)
CO2: 27 mmol/L (ref 22–32)
Calcium: 9.3 mg/dL (ref 8.9–10.3)
Chloride: 99 mmol/L (ref 98–111)
Creatinine, Ser: 0.53 mg/dL — ABNORMAL LOW (ref 0.61–1.24)
GFR calc Af Amer: >60 mL/min (ref >60)
GFR calc non Af Amer: >60 mL/min (ref >60)
GLUCOSE: 147 mg/dL — AB (ref 70–99)
Potassium: 4 mmol/L (ref 3.5–5.1)
Sodium: 137 mmol/L (ref 135–145)

## 2019-01-14 LAB — GLUCOSE, CAPILLARY
GLUCOSE-CAPILLARY: 126 mg/dL — AB (ref 70–99)
Glucose-Capillary: 109 mg/dL — ABNORMAL HIGH (ref 70–99)
Glucose-Capillary: 114 mg/dL — ABNORMAL HIGH (ref 70–99)
Glucose-Capillary: 120 mg/dL — ABNORMAL HIGH (ref 70–99)
Glucose-Capillary: 131 mg/dL — ABNORMAL HIGH (ref 70–99)
Glucose-Capillary: 93 mg/dL (ref 70–99)

## 2019-01-14 LAB — CBC
HCT: 30.2 % — ABNORMAL LOW (ref 39.0–52.0)
Hemoglobin: 8.9 g/dL — ABNORMAL LOW (ref 13.0–17.0)
MCH: 27 pg (ref 26.0–34.0)
MCHC: 29.5 g/dL — ABNORMAL LOW (ref 30.0–36.0)
MCV: 91.5 fL (ref 80.0–100.0)
PLATELETS: 765 10*3/uL — AB (ref 150–400)
RBC: 3.3 MIL/uL — ABNORMAL LOW (ref 4.22–5.81)
RDW: 15.4 % (ref 11.5–15.5)
WBC: 15.5 10*3/uL — ABNORMAL HIGH (ref 4.0–10.5)
nRBC: 0 % (ref 0.0–0.2)

## 2019-01-14 MED ORDER — ALTEPLASE 2 MG IJ SOLR
2.0000 mg | Freq: Once | INTRAMUSCULAR | Status: AC
  Administered 2019-01-14: 2 mg
  Filled 2019-01-14: qty 2

## 2019-01-14 NOTE — Procedures (Signed)
Cortrak  Person Inserting Tube:  Hunter Cook, RD Tube Type:  Cortrak - 43 inches Tube Location:  Right nare Initial Placement:  Postpyloric Secured by: Bridle Technique Used to Measure Tube Placement:  Documented cm marking at nare/ corner of mouth Cortrak Secured At:  86 cm    Cortrak Tube Team Note:  Consult received to place a Cortrak feeding tube.   No x-ray is required. RN may begin using tube.   If the tube becomes dislodged please keep the tube and contact the Cortrak team at [email or web address] ([email or web address]) for replacement.  If after hours and replacement cannot be delayed, place a NG tube and confirm placement with an abdominal x-ray.    [name], MS, RD, LDN Inpatient Clinical Dietitian Pager: [phone] Weekend/After Hours: [phone]
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
## 2019-01-15 ENCOUNTER — Other Ambulatory Visit: Payer: Self-pay

## 2019-01-15 LAB — GLUCOSE, CAPILLARY
GLUCOSE-CAPILLARY: 105 mg/dL — AB (ref 70–99)
Glucose-Capillary: 105 mg/dL — ABNORMAL HIGH (ref 70–99)
Glucose-Capillary: 122 mg/dL — ABNORMAL HIGH (ref 70–99)
Glucose-Capillary: 91 mg/dL (ref 70–99)
Glucose-Capillary: 124 mg/dL — ABNORMAL HIGH (ref 70–99)
Glucose-Capillary: 97 mg/dL (ref 70–99)

## 2019-01-15 LAB — CBC
HCT: 25.7 % — ABNORMAL LOW (ref 39.0–52.0)
Hemoglobin: 7.9 g/dL — ABNORMAL LOW (ref 13.0–17.0)
MCH: 27.9 pg (ref 26.0–34.0)
MCHC: 30.7 g/dL (ref 30.0–36.0)
MCV: 90.8 fL (ref 80.0–100.0)
PLATELETS: 676 10*3/uL — AB (ref 150–400)
RBC: 2.83 MIL/uL — ABNORMAL LOW (ref 4.22–5.81)
RDW: 15.5 % (ref 11.5–15.5)
WBC: 14.7 10*3/uL — ABNORMAL HIGH (ref 4.0–10.5)
nRBC: 0 % (ref 0.0–0.2)

## 2019-01-15 LAB — BASIC METABOLIC PANEL
Anion gap: 10 (ref 5–15)
BUN: 12 mg/dL (ref 6–20)
CO2: 24 mmol/L (ref 22–32)
Calcium: 8.1 mg/dL — ABNORMAL LOW (ref 8.9–10.3)
Chloride: 100 mmol/L (ref 98–111)
Creatinine, Ser: 0.38 mg/dL — ABNORMAL LOW (ref 0.61–1.24)
GFR calc Af Amer: >60 mL/min (ref >60)
Glucose, Bld: 105 mg/dL — ABNORMAL HIGH (ref 70–99)
Potassium: 3.3 mmol/L — ABNORMAL LOW (ref 3.5–5.1)
Sodium: 134 mmol/L — ABNORMAL LOW (ref 135–145)

## 2019-01-15 MED ORDER — POTASSIUM CHLORIDE 20 MEQ/15ML (10%) PO SOLN
40.0000 meq | Freq: Two times a day (BID) | ORAL | Status: AC
  Administered 2019-01-15 (×2): 40 meq
  Filled 2019-01-15 (×2): qty 30

## 2019-01-15 MED ORDER — INFLUENZA VAC SPLIT QUAD 0.5 ML IM SUSY
0.5000 mL | PREFILLED_SYRINGE | INTRAMUSCULAR | Status: AC
  Administered 2019-01-16: 0.5 mL via INTRAMUSCULAR
  Filled 2019-01-15: qty 0.5

## 2019-01-15 MED ORDER — PNEUMOCOCCAL VAC POLYVALENT 25 MCG/0.5ML IJ INJ
0.5000 mL | INJECTION | INTRAMUSCULAR | Status: AC
  Administered 2019-01-16: 0.5 mL via INTRAMUSCULAR
  Filled 2019-01-15: qty 0.5
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
## 2019-01-15 NOTE — Progress Notes (Signed)
Cortrak Tube Team Note:  Cortrak team placed pt's Cortrak tube yesterday. Bridle placement attempted yesterday but was unsuccessful to tube secured with tape.  Returned today to re-attempt bridle placement. Two RDs attempted bridle placement but were unsuccessful. Pt with large amounts of hard nasal secretions preventing bridle stylet insertion. Pt became agitated and bridle attempt stopped. Cortrak tube remains secured with tape. Can re-attempt bridle tomorrow.   [name], MS, RD, LDN Inpatient Clinical Dietitian Pager: [phone] Weekend/After Hours: [phone]

## 2019-01-16 LAB — BASIC METABOLIC PANEL
Anion gap: 10 (ref 5–15)
BUN: 14 mg/dL (ref 6–20)
CO2: 23 mmol/L (ref 22–32)
Calcium: 7.8 mg/dL — ABNORMAL LOW (ref 8.9–10.3)
Chloride: 103 mmol/L (ref 98–111)
Creatinine, Ser: 0.42 mg/dL — ABNORMAL LOW (ref 0.61–1.24)
GFR calc non Af Amer: >60 mL/min (ref >60)
Glucose, Bld: 130 mg/dL — ABNORMAL HIGH (ref 70–99)
POTASSIUM: 4.4 mmol/L (ref 3.5–5.1)
Sodium: 136 mmol/L (ref 135–145)

## 2019-01-16 LAB — GLUCOSE, CAPILLARY
Glucose-Capillary: 111 mg/dL — ABNORMAL HIGH (ref 70–99)
Glucose-Capillary: 118 mg/dL — ABNORMAL HIGH (ref 70–99)
Glucose-Capillary: 97 mg/dL (ref 70–99)
Glucose-Capillary: 126 mg/dL — ABNORMAL HIGH (ref 70–99)
Glucose-Capillary: 92 mg/dL (ref 70–99)
Glucose-Capillary: 113 mg/dL — ABNORMAL HIGH (ref 70–99)

## 2019-01-16 LAB — CBC
HCT: 24.6 % — ABNORMAL LOW (ref 39.0–52.0)
Hemoglobin: 7.3 g/dL — ABNORMAL LOW (ref 13.0–17.0)
MCH: 27.3 pg (ref 26.0–34.0)
MCHC: 29.7 g/dL — ABNORMAL LOW (ref 30.0–36.0)
MCV: 92.1 fL (ref 80.0–100.0)
NRBC: 0 % (ref 0.0–0.2)
Platelets: 469 10*3/uL — ABNORMAL HIGH (ref 150–400)
RBC: 2.67 MIL/uL — AB (ref 4.22–5.81)
RDW: 15.9 % — ABNORMAL HIGH (ref 11.5–15.5)
WBC: 12.9 10*3/uL — ABNORMAL HIGH (ref 4.0–10.5)
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
## 2019-01-16 NOTE — Progress Notes (Signed)
RT attempted to wean patient for second time today. Settings 10/5. Patient failed both times. No patient effort. RT will continue to monitor.

## 2019-01-17 LAB — BASIC METABOLIC PANEL
Anion gap: 11 (ref 5–15)
BUN: 14 mg/dL (ref 6–20)
CHLORIDE: 97 mmol/L — AB (ref 98–111)
CO2: 29 mmol/L (ref 22–32)
Calcium: 9.1 mg/dL (ref 8.9–10.3)
Creatinine, Ser: 0.47 mg/dL — ABNORMAL LOW (ref 0.61–1.24)
GFR calc Af Amer: >60 mL/min (ref >60)
GFR calc non Af Amer: >60 mL/min (ref >60)
Glucose, Bld: 97 mg/dL (ref 70–99)
Potassium: 5.1 mmol/L (ref 3.5–5.1)
SODIUM: 137 mmol/L (ref 135–145)

## 2019-01-17 LAB — CBC
HCT: 26.4 % — ABNORMAL LOW (ref 39.0–52.0)
Hemoglobin: 8.1 g/dL — ABNORMAL LOW (ref 13.0–17.0)
MCH: 27.4 pg (ref 26.0–34.0)
MCHC: 30.7 g/dL (ref 30.0–36.0)
MCV: 89.2 fL (ref 80.0–100.0)
Platelets: 674 10*3/uL — ABNORMAL HIGH (ref 150–400)
RBC: 2.96 MIL/uL — ABNORMAL LOW (ref 4.22–5.81)
RDW: 16.2 % — ABNORMAL HIGH (ref 11.5–15.5)
WBC: 14.9 10*3/uL — ABNORMAL HIGH (ref 4.0–10.5)
nRBC: 0 % (ref 0.0–0.2)

## 2019-01-17 LAB — GLUCOSE, CAPILLARY
GLUCOSE-CAPILLARY: 93 mg/dL (ref 70–99)
Glucose-Capillary: 102 mg/dL — ABNORMAL HIGH (ref 70–99)
Glucose-Capillary: 102 mg/dL — ABNORMAL HIGH (ref 70–99)
Glucose-Capillary: 115 mg/dL — ABNORMAL HIGH (ref 70–99)
Glucose-Capillary: 125 mg/dL — ABNORMAL HIGH (ref 70–99)
Glucose-Capillary: 98 mg/dL (ref 70–99)
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
## 2019-01-18 LAB — GLUCOSE, CAPILLARY
Glucose-Capillary: 109 mg/dL — ABNORMAL HIGH (ref 70–99)
Glucose-Capillary: 121 mg/dL — ABNORMAL HIGH (ref 70–99)
Glucose-Capillary: 106 mg/dL — ABNORMAL HIGH (ref 70–99)
Glucose-Capillary: 105 mg/dL — ABNORMAL HIGH (ref 70–99)
Glucose-Capillary: 110 mg/dL — ABNORMAL HIGH (ref 70–99)

## 2019-01-18 MED ORDER — MELATONIN 3 MG PO TABS
3.0000 mg | ORAL_TABLET | Freq: Once | ORAL | Status: AC
  Administered 2019-01-18: 3 mg via ORAL
  Filled 2019-01-18: qty 1
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
## 2019-01-19 ENCOUNTER — Inpatient Hospital Stay (HOSPITAL_COMMUNITY): Payer: Medicaid Other

## 2019-01-19 LAB — BASIC METABOLIC PANEL
Anion gap: 5 (ref 5–15)
BUN: 14 mg/dL (ref 6–20)
CO2: 26 mmol/L (ref 22–32)
Calcium: 7.5 mg/dL — ABNORMAL LOW (ref 8.9–10.3)
Chloride: 107 mmol/L (ref 98–111)
Glucose, Bld: 130 mg/dL — ABNORMAL HIGH (ref 70–99)
Potassium: 4.3 mmol/L (ref 3.5–5.1)
Sodium: 138 mmol/L (ref 135–145)

## 2019-01-19 LAB — GLUCOSE, CAPILLARY
Glucose-Capillary: 102 mg/dL — ABNORMAL HIGH (ref 70–99)
Glucose-Capillary: 109 mg/dL — ABNORMAL HIGH (ref 70–99)
Glucose-Capillary: 89 mg/dL (ref 70–99)
Glucose-Capillary: 95 mg/dL (ref 70–99)
Glucose-Capillary: 99 mg/dL (ref 70–99)
Glucose-Capillary: 99 mg/dL (ref 70–99)

## 2019-01-19 LAB — CBC
HCT: 22.7 % — ABNORMAL LOW (ref 39.0–52.0)
HCT: 31.5 % — ABNORMAL LOW (ref 39.0–52.0)
Hemoglobin: 6.8 g/dL — CL (ref 13.0–17.0)
Hemoglobin: 9.7 g/dL — ABNORMAL LOW (ref 13.0–17.0)
MCH: 27 pg (ref 26.0–34.0)
MCH: 27.4 pg (ref 26.0–34.0)
MCHC: 30 g/dL (ref 30.0–36.0)
MCHC: 30.8 g/dL (ref 30.0–36.0)
MCV: 90.1 fL (ref 80.0–100.0)
MCV: 89 fL (ref 80.0–100.0)
Platelets: 538 10*3/uL — ABNORMAL HIGH (ref 150–400)
Platelets: 722 10*3/uL — ABNORMAL HIGH (ref 150–400)
RBC: 2.52 MIL/uL — ABNORMAL LOW (ref 4.22–5.81)
RBC: 3.54 MIL/uL — ABNORMAL LOW (ref 4.22–5.81)
RDW: 16.3 % — ABNORMAL HIGH (ref 11.5–15.5)
RDW: 15.8 % — ABNORMAL HIGH (ref 11.5–15.5)
WBC: 9 10*3/uL (ref 4.0–10.5)
WBC: 12.1 10*3/uL — ABNORMAL HIGH (ref 4.0–10.5)
nRBC: 0 % (ref 0.0–0.2)
nRBC: 0 % (ref 0.0–0.2)

## 2019-01-19 LAB — PREPARE RBC (CROSSMATCH)

## 2019-01-19 MED ORDER — PNEUMOCOCCAL 13-VAL CONJ VACC IM SUSP
0.5000 mL | Freq: Once | INTRAMUSCULAR | Status: AC
  Administered 2019-01-19: 0.5 mL via INTRAMUSCULAR
  Filled 2019-01-19: qty 0.5

## 2019-01-19 MED ORDER — MIDAZOLAM 50MG/50ML (1MG/ML) PREMIX INFUSION
0.0000 mg/h | INTRAVENOUS | Status: DC
  Administered 2019-01-19: 8 mg/h via INTRAVENOUS
  Administered 2019-01-19: 7 mg/h via INTRAVENOUS
  Administered 2019-01-19: 8 mg/h via INTRAVENOUS
  Administered 2019-01-20: 9 mg/h via INTRAVENOUS
  Filled 2019-01-19 (×5): qty 50

## 2019-01-19 MED ORDER — QUETIAPINE FUMARATE 200 MG PO TABS
300.0000 mg | ORAL_TABLET | Freq: Three times a day (TID) | ORAL | Status: DC
  Administered 2019-01-19 – 2019-02-05 (×50): 300 mg
  Filled 2019-01-19 (×52): qty 1

## 2019-01-19 MED ORDER — CHLORHEXIDINE GLUCONATE CLOTH 2 % EX PADS
6.0000 | MEDICATED_PAD | Freq: Every day | CUTANEOUS | Status: DC
  Administered 2019-01-19 – 2019-02-05 (×14): 6 via TOPICAL

## 2019-01-19 MED ORDER — HAEMOPHILUS B POLYSAC CONJ VAC IM SOLR
0.5000 mL | Freq: Once | INTRAMUSCULAR | Status: AC
  Administered 2019-01-19: 0.5 mL via INTRAMUSCULAR
  Filled 2019-01-19: qty 0.5

## 2019-01-19 MED ORDER — MENINGOCOCCAL A C Y&W-135 OLIG IM SOLR
0.5000 mL | Freq: Once | INTRAMUSCULAR | Status: AC
  Administered 2019-01-19: 0.5 mL via INTRAMUSCULAR
  Filled 2019-01-19 (×2): qty 0.5

## 2019-01-19 MED ORDER — SODIUM CHLORIDE 0.9% IV SOLUTION
Freq: Once | INTRAVENOUS | Status: AC
  Administered 2019-01-19: 13:00:00 via INTRAVENOUS

## 2019-01-19 MED ORDER — SODIUM CHLORIDE 0.9 % IV SOLN
0.5000 mg/h | INTRAVENOUS | Status: DC
  Administered 2019-01-19: 7 mg/h via INTRAVENOUS
  Filled 2019-01-19: qty 10

## 2019-01-19 NOTE — Progress Notes (Signed)
Pt has HGB of 7.8 and will be reported to dayshift nurse to inform rounding physician.
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
## 2019-01-20 DIAGNOSIS — L8990 Pressure ulcer of unspecified site, unspecified stage: Secondary | ICD-10-CM

## 2019-01-20 LAB — TYPE AND SCREEN
ABO/RH(D): A POS
Antibody Screen: NEGATIVE
Unit division: 0

## 2019-01-20 LAB — BASIC METABOLIC PANEL
Anion gap: 12 (ref 5–15)
BUN: 15 mg/dL (ref 6–20)
CO2: 29 mmol/L (ref 22–32)
CREATININE: 0.46 mg/dL — AB (ref 0.61–1.24)
Calcium: 9.5 mg/dL (ref 8.9–10.3)
Chloride: 99 mmol/L (ref 98–111)
GFR calc Af Amer: >60 mL/min (ref >60)
GFR calc non Af Amer: >60 mL/min (ref >60)
Glucose, Bld: 133 mg/dL — ABNORMAL HIGH (ref 70–99)
Potassium: 3.9 mmol/L (ref 3.5–5.1)
Sodium: 140 mmol/L (ref 135–145)

## 2019-01-20 LAB — CBC
HEMATOCRIT: 31.7 % — AB (ref 39.0–52.0)
Hemoglobin: 9.9 g/dL — ABNORMAL LOW (ref 13.0–17.0)
MCH: 27.7 pg (ref 26.0–34.0)
MCHC: 31.2 g/dL (ref 30.0–36.0)
MCV: 88.5 fL (ref 80.0–100.0)
Platelets: 635 10*3/uL — ABNORMAL HIGH (ref 150–400)
RBC: 3.58 MIL/uL — ABNORMAL LOW (ref 4.22–5.81)
RDW: 15.9 % — AB (ref 11.5–15.5)
WBC: 12.3 10*3/uL — ABNORMAL HIGH (ref 4.0–10.5)
nRBC: 0 % (ref 0.0–0.2)

## 2019-01-20 LAB — BPAM RBC
BLOOD PRODUCT EXPIRATION DATE: 202003182359
ISSUE DATE / TIME: 202002241251
Unit Type and Rh: 6200

## 2019-01-20 LAB — GLUCOSE, CAPILLARY
GLUCOSE-CAPILLARY: 121 mg/dL — AB (ref 70–99)
Glucose-Capillary: 101 mg/dL — ABNORMAL HIGH (ref 70–99)
Glucose-Capillary: 103 mg/dL — ABNORMAL HIGH (ref 70–99)
Glucose-Capillary: 122 mg/dL — ABNORMAL HIGH (ref 70–99)
Glucose-Capillary: 95 mg/dL (ref 70–99)
Glucose-Capillary: 95 mg/dL (ref 70–99)

## 2019-01-20 MED ORDER — OXYCODONE HCL 5 MG/5ML PO SOLN
10.0000 mg | Freq: Four times a day (QID) | ORAL | Status: DC
  Administered 2019-01-20 – 2019-01-21 (×4): 10 mg
  Filled 2019-01-20 (×4): qty 10

## 2019-01-20 MED ORDER — SODIUM CHLORIDE 0.9 % IV SOLN
0.0000 mg/h | INTRAVENOUS | Status: DC
  Administered 2019-01-20: 6 mg/h via INTRAVENOUS
  Filled 2019-01-20 (×3): qty 10

## 2019-01-20 MED ORDER — MIDAZOLAM 50MG/50ML (1MG/ML) PREMIX INFUSION
0.0000 mg/h | INTRAVENOUS | Status: DC
  Administered 2019-01-20 – 2019-01-21 (×4): 6 mg/h via INTRAVENOUS
  Administered 2019-01-22: 9 mg/h via INTRAVENOUS
  Administered 2019-01-22: 10 mg/h via INTRAVENOUS
  Administered 2019-01-22 (×2): 6 mg/h via INTRAVENOUS
  Administered 2019-01-22: 10 mg/h via INTRAVENOUS
  Administered 2019-01-23: 9 mg/h via INTRAVENOUS
  Administered 2019-01-23: 7 mg/h via INTRAVENOUS
  Administered 2019-01-23: 8 mg/h via INTRAVENOUS
  Administered 2019-01-24 (×2): 7 mg/h via INTRAVENOUS
  Administered 2019-01-24: 4 mg/h via INTRAVENOUS
  Administered 2019-01-25 (×2): 8 mg/h via INTRAVENOUS
  Administered 2019-01-25: 6 mg/h via INTRAVENOUS
  Administered 2019-01-25 – 2019-01-26 (×3): 7 mg/h via INTRAVENOUS
  Administered 2019-01-26: 6 mg/h via INTRAVENOUS
  Administered 2019-01-26: 6.5 mg/h via INTRAVENOUS
  Administered 2019-01-27: 9 mg/h via INTRAVENOUS
  Administered 2019-01-27: 8 mg/h via INTRAVENOUS
  Administered 2019-01-27 (×2): 10 mg/h via INTRAVENOUS
  Administered 2019-01-27: 7 mg/h via INTRAVENOUS
  Administered 2019-01-28 (×3): 6 mg/h via INTRAVENOUS
  Administered 2019-01-29: 8 mg/h via INTRAVENOUS
  Administered 2019-01-29: 6 mg/h via INTRAVENOUS
  Administered 2019-01-29: 4 mg/h via INTRAVENOUS
  Administered 2019-01-29: 6 mg/h via INTRAVENOUS
  Administered 2019-01-30: 10 mg/h via INTRAVENOUS
  Administered 2019-01-30: 6 mg/h via INTRAVENOUS
  Administered 2019-01-30 – 2019-01-31 (×3): 4 mg/h via INTRAVENOUS
  Administered 2019-02-01: 3 mg/h via INTRAVENOUS
  Filled 2019-01-20 (×15): qty 50
  Filled 2019-01-20: qty 100
  Filled 2019-01-20 (×28): qty 50

## 2019-01-20 MED ORDER — COLLAGENASE 250 UNIT/GM EX OINT
TOPICAL_OINTMENT | Freq: Every day | CUTANEOUS | Status: AC
  Administered 2019-01-20: 17:00:00 via TOPICAL
  Administered 2019-01-21: 1 via TOPICAL
  Administered 2019-01-22: 16:00:00 via TOPICAL
  Administered 2019-01-23 – 2019-01-30 (×8): 1 via TOPICAL
  Administered 2019-01-31 – 2019-02-02 (×3): via TOPICAL
  Filled 2019-01-20 (×2): qty 30

## 2019-01-20 MED ORDER — MIDAZOLAM HCL-SODIUM CHLORIDE 50-0.9 MG/50ML-% IV SOLN
0.0000 mg | INTRAVENOUS | Status: DC
  Filled 2019-01-20 (×5): qty 50

## 2019-01-20 NOTE — Consult Note (Signed)
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  Minimal from the mandible wound, yellow serosanguinous   Periwound: intact, attempted to assess under the other pressure points on the C collar, bedside nurse verified from the weekend occiput intact  Dressing procedure/placement/frequency: Added enzymatic debridement for the mandible, cover with foam Protect bony prominences with foam at C-collar Add prevalon boots for offloading heels Add silver hydrofiber packed into the trach site for excess drainage, cover with foam. Change packing daily.   WOC Nurse team will follow with you and see patient within 10 days for wound assessments.  Please notify WOC nurses of any acute changes in the wounds or any new areas of concern [facility] MSN, RN,CWOCN, CNS, CWON-AP [phone]
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
## 2019-01-21 ENCOUNTER — Inpatient Hospital Stay (HOSPITAL_COMMUNITY): Payer: Medicaid Other

## 2019-01-21 LAB — BASIC METABOLIC PANEL
Anion gap: 9 (ref 5–15)
BUN: 16 mg/dL (ref 6–20)
CO2: 29 mmol/L (ref 22–32)
Calcium: 8.9 mg/dL (ref 8.9–10.3)
Chloride: 99 mmol/L (ref 98–111)
Creatinine, Ser: 0.45 mg/dL — ABNORMAL LOW (ref 0.61–1.24)
Glucose, Bld: 122 mg/dL — ABNORMAL HIGH (ref 70–99)
POTASSIUM: 4 mmol/L (ref 3.5–5.1)
Sodium: 137 mmol/L (ref 135–145)

## 2019-01-21 LAB — GLUCOSE, CAPILLARY
GLUCOSE-CAPILLARY: 123 mg/dL — AB (ref 70–99)
Glucose-Capillary: 109 mg/dL — ABNORMAL HIGH (ref 70–99)
Glucose-Capillary: 108 mg/dL — ABNORMAL HIGH (ref 70–99)
Glucose-Capillary: 94 mg/dL (ref 70–99)
Glucose-Capillary: 98 mg/dL (ref 70–99)

## 2019-01-21 LAB — CBC
HCT: 30.9 % — ABNORMAL LOW (ref 39.0–52.0)
Hemoglobin: 9.3 g/dL — ABNORMAL LOW (ref 13.0–17.0)
MCH: 26.6 pg (ref 26.0–34.0)
MCHC: 30.1 g/dL (ref 30.0–36.0)
MCV: 88.5 fL (ref 80.0–100.0)
PLATELETS: 663 10*3/uL — AB (ref 150–400)
RBC: 3.49 MIL/uL — ABNORMAL LOW (ref 4.22–5.81)
RDW: 15.9 % — ABNORMAL HIGH (ref 11.5–15.5)
WBC: 9.5 10*3/uL (ref 4.0–10.5)
nRBC: 0 % (ref 0.0–0.2)

## 2019-01-21 MED ORDER — OXYCODONE HCL 5 MG/5ML PO SOLN
15.0000 mg | Freq: Four times a day (QID) | ORAL | Status: DC
  Administered 2019-01-21 – 2019-02-09 (×75): 15 mg
  Filled 2019-01-21 (×76): qty 15
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
## 2019-01-21 NOTE — Progress Notes (Signed)
Pt's father gave this RN and Durwin Nora, RN permission to shave pt's beard in order for [place] to properly dress his wounds and assess frequently the skin under the [employer]. Milbern Doescher, [place] Scrape, RN

## 2019-01-22 ENCOUNTER — Inpatient Hospital Stay (HOSPITAL_COMMUNITY): Payer: Medicaid Other

## 2019-01-22 LAB — GLUCOSE, CAPILLARY
Glucose-Capillary: 100 mg/dL — ABNORMAL HIGH (ref 70–99)
Glucose-Capillary: 105 mg/dL — ABNORMAL HIGH (ref 70–99)
Glucose-Capillary: 109 mg/dL — ABNORMAL HIGH (ref 70–99)
Glucose-Capillary: 114 mg/dL — ABNORMAL HIGH (ref 70–99)
Glucose-Capillary: 116 mg/dL — ABNORMAL HIGH (ref 70–99)
Glucose-Capillary: 117 mg/dL — ABNORMAL HIGH (ref 70–99)
Glucose-Capillary: 94 mg/dL (ref 70–99)

## 2019-01-22 LAB — BASIC METABOLIC PANEL
Anion gap: 10 (ref 5–15)
BUN: 15 mg/dL (ref 6–20)
CO2: 29 mmol/L (ref 22–32)
Calcium: 9.3 mg/dL (ref 8.9–10.3)
Chloride: 99 mmol/L (ref 98–111)
Creatinine, Ser: 0.47 mg/dL — ABNORMAL LOW (ref 0.61–1.24)
GFR calc Af Amer: >60 mL/min (ref >60)
Glucose, Bld: 104 mg/dL — ABNORMAL HIGH (ref 70–99)
POTASSIUM: 3.8 mmol/L (ref 3.5–5.1)
Sodium: 138 mmol/L (ref 135–145)

## 2019-01-22 MED ORDER — LIDOCAINE VISCOUS HCL 2 % MT SOLN
15.0000 mL | Freq: Once | OROMUCOSAL | Status: AC
  Administered 2019-01-22: 3 mL via OROMUCOSAL
  Filled 2019-01-22: qty 15

## 2019-01-22 MED ORDER — LIDOCAINE VISCOUS HCL 2 % MT SOLN
OROMUCOSAL | Status: AC
  Administered 2019-01-22: 3 mL via OROMUCOSAL
  Filled 2019-01-22: qty 15

## 2019-01-22 MED ORDER — IOPAMIDOL (ISOVUE-300) INJECTION 61%
50.0000 mL | Freq: Once | INTRAVENOUS | Status: AC | PRN
  Administered 2019-01-22: 20 mL

## 2019-01-22 MED ORDER — IOPAMIDOL (ISOVUE-300) INJECTION 61%
INTRAVENOUS | Status: AC
  Filled 2019-01-22: qty 50
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
## 2019-01-22 NOTE — Progress Notes (Signed)
Patient found with Cortrak dislodged. Patient assessed. Will continue to monitor.

## 2019-01-22 NOTE — Progress Notes (Signed)
Cortrak Tube Team Note:  Asked by pt RN to place a bridle for recently placed Cortrak feeding tube.   Nasal bridle placed, secured 55 in Cortrak tube at 102 cm in the R nare.   If the tube becomes dislodged please keep the tube and contact the Cortrak team at [email or web address] (password [email or web address]) for replacement.  If after hours and replacement cannot be delayed, place a NG tube and confirm placement with an abdominal x-ray.    Kendell Bane RD, LDN, CNSC [phone] Pager [phone] After Hours Pager

## 2019-01-23 ENCOUNTER — Inpatient Hospital Stay (HOSPITAL_COMMUNITY): Payer: Medicaid Other

## 2019-01-23 LAB — GLUCOSE, CAPILLARY
GLUCOSE-CAPILLARY: 132 mg/dL — AB (ref 70–99)
GLUCOSE-CAPILLARY: 91 mg/dL (ref 70–99)
Glucose-Capillary: 113 mg/dL — ABNORMAL HIGH (ref 70–99)
Glucose-Capillary: 96 mg/dL (ref 70–99)
Glucose-Capillary: 104 mg/dL — ABNORMAL HIGH (ref 70–99)

## 2019-01-23 LAB — CBC
HCT: 33 % — ABNORMAL LOW (ref 39.0–52.0)
Hemoglobin: 10.1 g/dL — ABNORMAL LOW (ref 13.0–17.0)
MCH: 26.8 pg (ref 26.0–34.0)
MCHC: 30.6 g/dL (ref 30.0–36.0)
MCV: 87.5 fL (ref 80.0–100.0)
Platelets: 696 10*3/uL — ABNORMAL HIGH (ref 150–400)
RBC: 3.77 MIL/uL — ABNORMAL LOW (ref 4.22–5.81)
RDW: 15.9 % — ABNORMAL HIGH (ref 11.5–15.5)
WBC: 10 10*3/uL (ref 4.0–10.5)
nRBC: 0 % (ref 0.0–0.2)

## 2019-01-23 MED ORDER — FUROSEMIDE 10 MG/ML IJ SOLN
40.0000 mg | Freq: Once | INTRAMUSCULAR | Status: AC
  Administered 2019-01-23: 40 mg via INTRAVENOUS
  Filled 2019-01-23: qty 4
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
## 2019-01-24 LAB — BASIC METABOLIC PANEL
Anion gap: 7 (ref 5–15)
BUN: 12 mg/dL (ref 6–20)
CO2: 29 mmol/L (ref 22–32)
Calcium: 9.5 mg/dL (ref 8.9–10.3)
Chloride: 103 mmol/L (ref 98–111)
Creatinine, Ser: 0.46 mg/dL — ABNORMAL LOW (ref 0.61–1.24)
GFR calc Af Amer: >60 mL/min (ref >60)
GFR calc non Af Amer: >60 mL/min (ref >60)
Glucose, Bld: 139 mg/dL — ABNORMAL HIGH (ref 70–99)
Potassium: 3.8 mmol/L (ref 3.5–5.1)
Sodium: 139 mmol/L (ref 135–145)

## 2019-01-24 LAB — GLUCOSE, CAPILLARY
Glucose-Capillary: 100 mg/dL — ABNORMAL HIGH (ref 70–99)
Glucose-Capillary: 117 mg/dL — ABNORMAL HIGH (ref 70–99)
Glucose-Capillary: 100 mg/dL — ABNORMAL HIGH (ref 70–99)
Glucose-Capillary: 121 mg/dL — ABNORMAL HIGH (ref 70–99)
Glucose-Capillary: 113 mg/dL — ABNORMAL HIGH (ref 70–99)
Glucose-Capillary: 90 mg/dL (ref 70–99)
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
## 2019-01-25 LAB — MRSA PCR SCREENING: MRSA BY PCR: NEGATIVE

## 2019-01-25 LAB — CBC
HCT: 29.3 % — ABNORMAL LOW (ref 39.0–52.0)
Hemoglobin: 9.1 g/dL — ABNORMAL LOW (ref 13.0–17.0)
MCH: 27.2 pg (ref 26.0–34.0)
MCHC: 31.1 g/dL (ref 30.0–36.0)
MCV: 87.5 fL (ref 80.0–100.0)
Platelets: 547 10*3/uL — ABNORMAL HIGH (ref 150–400)
RBC: 3.35 MIL/uL — ABNORMAL LOW (ref 4.22–5.81)
RDW: 15.2 % (ref 11.5–15.5)
WBC: 8.6 10*3/uL (ref 4.0–10.5)
nRBC: 0 % (ref 0.0–0.2)

## 2019-01-25 LAB — GLUCOSE, CAPILLARY
GLUCOSE-CAPILLARY: 110 mg/dL — AB (ref 70–99)
GLUCOSE-CAPILLARY: 120 mg/dL — AB (ref 70–99)
Glucose-Capillary: 107 mg/dL — ABNORMAL HIGH (ref 70–99)
Glucose-Capillary: 104 mg/dL — ABNORMAL HIGH (ref 70–99)
Glucose-Capillary: 73 mg/dL (ref 70–99)
Glucose-Capillary: 111 mg/dL — ABNORMAL HIGH (ref 70–99)

## 2019-01-25 MED ORDER — PANTOPRAZOLE SODIUM 40 MG PO PACK
40.0000 mg | PACK | Freq: Every day | ORAL | Status: DC
  Administered 2019-01-25 – 2019-02-09 (×16): 40 mg
  Filled 2019-01-25 (×15): qty 20

## 2019-01-25 MED ORDER — DOCUSATE SODIUM 50 MG/5ML PO LIQD
50.0000 mg | Freq: Every day | ORAL | Status: DC | PRN
  Administered 2019-01-28 – 2019-02-09 (×3): 50 mg
  Filled 2019-01-25 (×3): qty 10

## 2019-01-25 MED ORDER — POLYETHYLENE GLYCOL 3350 17 G PO PACK
17.0000 g | PACK | Freq: Every day | ORAL | Status: DC | PRN
  Administered 2019-01-28 – 2019-01-30 (×3): 17 g
  Filled 2019-01-25 (×4): qty 1

## 2019-01-25 MED ORDER — PIVOT 1.5 CAL PO LIQD
1000.0000 mL | ORAL | Status: DC
  Administered 2019-01-25 – 2019-01-27 (×4): 1000 mL
  Filled 2019-01-25 (×2): qty 1000

## 2019-01-25 MED ORDER — GUAIFENESIN 100 MG/5ML PO SOLN
5.0000 mL | Freq: Four times a day (QID) | ORAL | Status: DC
  Administered 2019-01-25 – 2019-02-09 (×61): 100 mg
  Filled 2019-01-25 (×5): qty 15
  Filled 2019-01-25: qty 10
  Filled 2019-01-25 (×2): qty 15
  Filled 2019-01-25: qty 10
  Filled 2019-01-25 (×8): qty 15
  Filled 2019-01-25: qty 10
  Filled 2019-01-25 (×5): qty 15
  Filled 2019-01-25: qty 10
  Filled 2019-01-25 (×5): qty 15
  Filled 2019-01-25: qty 10
  Filled 2019-01-25 (×4): qty 15
  Filled 2019-01-25: qty 10
  Filled 2019-01-25: qty 45
  Filled 2019-01-25 (×16): qty 15
  Filled 2019-01-25 (×4): qty 10
  Filled 2019-01-25: qty 15
  Filled 2019-01-25: qty 10
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
## 2019-01-26 LAB — GLUCOSE, CAPILLARY
GLUCOSE-CAPILLARY: 99 mg/dL (ref 70–99)
Glucose-Capillary: 101 mg/dL — ABNORMAL HIGH (ref 70–99)
Glucose-Capillary: 109 mg/dL — ABNORMAL HIGH (ref 70–99)
Glucose-Capillary: 117 mg/dL — ABNORMAL HIGH (ref 70–99)
Glucose-Capillary: 120 mg/dL — ABNORMAL HIGH (ref 70–99)
Glucose-Capillary: 91 mg/dL (ref 70–99)

## 2019-01-26 MED ORDER — LORAZEPAM 2 MG/ML IJ SOLN
INTRAMUSCULAR | Status: AC
  Filled 2019-01-26: qty 1

## 2019-01-26 MED ORDER — LORAZEPAM 2 MG/ML IJ SOLN
2.0000 mg | Freq: Once | INTRAMUSCULAR | Status: AC
  Administered 2019-01-26: 2 mg via INTRAVENOUS
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
## 2019-01-27 LAB — BASIC METABOLIC PANEL
Anion gap: 10 (ref 5–15)
BUN: 14 mg/dL (ref 6–20)
CO2: 26 mmol/L (ref 22–32)
Calcium: 9 mg/dL (ref 8.9–10.3)
Chloride: 104 mmol/L (ref 98–111)
Creatinine, Ser: 0.47 mg/dL — ABNORMAL LOW (ref 0.61–1.24)
GFR calc Af Amer: >60 mL/min (ref >60)
GFR calc non Af Amer: >60 mL/min (ref >60)
Glucose, Bld: 99 mg/dL (ref 70–99)
Potassium: 3.5 mmol/L (ref 3.5–5.1)
Sodium: 140 mmol/L (ref 135–145)

## 2019-01-27 LAB — GLUCOSE, CAPILLARY
GLUCOSE-CAPILLARY: 88 mg/dL (ref 70–99)
Glucose-Capillary: 103 mg/dL — ABNORMAL HIGH (ref 70–99)
Glucose-Capillary: 104 mg/dL — ABNORMAL HIGH (ref 70–99)
Glucose-Capillary: 123 mg/dL — ABNORMAL HIGH (ref 70–99)
Glucose-Capillary: 134 mg/dL — ABNORMAL HIGH (ref 70–99)
Glucose-Capillary: 116 mg/dL — ABNORMAL HIGH (ref 70–99)

## 2019-01-27 MED ORDER — LORAZEPAM 2 MG/ML IJ SOLN
2.0000 mg | INTRAMUSCULAR | Status: DC | PRN
  Administered 2019-01-27 – 2019-02-10 (×25): 2 mg via INTRAVENOUS
  Filled 2019-01-27 (×27): qty 1

## 2019-01-27 MED ORDER — LORAZEPAM 2 MG/ML IJ SOLN
2.0000 mg | Freq: Once | INTRAMUSCULAR | Status: AC
  Administered 2019-01-27: 2 mg via INTRAVENOUS
  Filled 2019-01-27: qty 1

## 2019-01-27 NOTE — Consult Note (Signed)
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  Unstageable MDRPI right mandible   Measurement:  Trach: 1.0cm x 2.5cm x 0.3cm, unable to get much depth due to erosion of the stoma site Right mandible: 1.0cm x 4.0cm x 0.1cm Wound bed: Trach: 100% clean, pink, early granulation tissue, of note the stoma has been eroded some which is allowing secretions to come from the central part of the wound when patient is coughing  Mandible: 75% pink with some re-epithelialization; 25% yellow Drainage (amount, consistency, odor) minimal from each site, it is difficult to assess the drainage at the trach site bc of the stoma and the respiratory secretions.  Periwound: intact Noted new areas of redness at the right clavicle, blanchable tissue, intact issue. Added silicone foam for prophylaxis and protection Dressing procedure/placement/frequency: Continue silver hydrofiber to the trach site, cover with foam. Change daily. Using Drawtex conductive dressing around trach for secretion management Continue enzymatic debridement for the mandible wound Monitor all tubes/lines/drains for potential skin damage, use prophylactic dressing as needed for prevention.  WOC Nurse team will follow with you and see patient within 10 days for wound assessments.  Please notify WOC nurses of any acute changes in the wounds or any new areas of concern Alydia Gosser [facility] MSN, RN,CWOCN, CNS, CWON-AP [phone]
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
## 2019-01-28 LAB — GLUCOSE, CAPILLARY
GLUCOSE-CAPILLARY: 110 mg/dL — AB (ref 70–99)
Glucose-Capillary: 97 mg/dL (ref 70–99)
Glucose-Capillary: 108 mg/dL — ABNORMAL HIGH (ref 70–99)
Glucose-Capillary: 111 mg/dL — ABNORMAL HIGH (ref 70–99)
Glucose-Capillary: 120 mg/dL — ABNORMAL HIGH (ref 70–99)
Glucose-Capillary: 121 mg/dL — ABNORMAL HIGH (ref 70–99)

## 2019-01-28 MED ORDER — ORAL CARE MOUTH RINSE
15.0000 mL | Freq: Two times a day (BID) | OROMUCOSAL | Status: DC
  Administered 2019-01-28 – 2019-02-09 (×25): 15 mL via OROMUCOSAL

## 2019-01-28 MED ORDER — PIVOT 1.5 CAL PO LIQD
1000.0000 mL | ORAL | Status: DC
  Administered 2019-01-28: 1000 mL

## 2019-01-28 MED ORDER — PIVOT 1.5 CAL PO LIQD
1000.0000 mL | ORAL | Status: DC
  Administered 2019-01-29 – 2019-02-09 (×13): 1000 mL
  Filled 2019-01-28 (×7): qty 1000

## 2019-01-28 MED ORDER — CLONIDINE HCL 0.1 MG PO TABS
0.3000 mg | ORAL_TABLET | Freq: Four times a day (QID) | ORAL | Status: DC
  Administered 2019-01-28 – 2019-01-31 (×9): 0.3 mg
  Filled 2019-01-28 (×9): qty 3

## 2019-01-28 NOTE — Progress Notes (Signed)
Paged Tresa Endo, [place] w/ Trauma re: pt hypotensive but new order for clonidine to help w/ weaning sedation. Verbal order to hold clonidine at this moment and to reassess if BP can tolerate clonidine when next dose is due.
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
## 2019-01-29 LAB — GLUCOSE, CAPILLARY
Glucose-Capillary: 105 mg/dL — ABNORMAL HIGH (ref 70–99)
Glucose-Capillary: 87 mg/dL (ref 70–99)
Glucose-Capillary: 115 mg/dL — ABNORMAL HIGH (ref 70–99)
Glucose-Capillary: 128 mg/dL — ABNORMAL HIGH (ref 70–99)
Glucose-Capillary: 93 mg/dL (ref 70–99)
Glucose-Capillary: 115 mg/dL — ABNORMAL HIGH (ref 70–99)

## 2019-01-29 MED ORDER — CHLORHEXIDINE GLUCONATE 0.12 % MT SOLN
OROMUCOSAL | Status: AC
  Filled 2019-01-29: qty 15

## 2019-01-29 MED ORDER — ALBUMIN HUMAN 5 % IV SOLN
25.0000 g | Freq: Once | INTRAVENOUS | Status: AC
  Administered 2019-01-29: 25 g via INTRAVENOUS
  Filled 2019-01-29: qty 500
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
## 2019-01-30 LAB — CBC
HCT: 34.3 % — ABNORMAL LOW (ref 39.0–52.0)
Hemoglobin: 10.7 g/dL — ABNORMAL LOW (ref 13.0–17.0)
MCH: 27 pg (ref 26.0–34.0)
MCHC: 31.2 g/dL (ref 30.0–36.0)
MCV: 86.4 fL (ref 80.0–100.0)
NRBC: 0 % (ref 0.0–0.2)
Platelets: 621 10*3/uL — ABNORMAL HIGH (ref 150–400)
RBC: 3.97 MIL/uL — ABNORMAL LOW (ref 4.22–5.81)
RDW: 16 % — ABNORMAL HIGH (ref 11.5–15.5)
WBC: 17.1 10*3/uL — ABNORMAL HIGH (ref 4.0–10.5)

## 2019-01-30 LAB — GLUCOSE, CAPILLARY
GLUCOSE-CAPILLARY: 93 mg/dL (ref 70–99)
Glucose-Capillary: 126 mg/dL — ABNORMAL HIGH (ref 70–99)
Glucose-Capillary: 115 mg/dL — ABNORMAL HIGH (ref 70–99)
Glucose-Capillary: 95 mg/dL (ref 70–99)
Glucose-Capillary: 86 mg/dL (ref 70–99)
Glucose-Capillary: 99 mg/dL (ref 70–99)

## 2019-01-30 LAB — BASIC METABOLIC PANEL
Anion gap: 8 (ref 5–15)
BUN: 13 mg/dL (ref 6–20)
CO2: 29 mmol/L (ref 22–32)
Calcium: 10.1 mg/dL (ref 8.9–10.3)
Chloride: 99 mmol/L (ref 98–111)
Creatinine, Ser: 0.45 mg/dL — ABNORMAL LOW (ref 0.61–1.24)
GFR calc Af Amer: >60 mL/min (ref >60)
GFR calc non Af Amer: >60 mL/min (ref >60)
Glucose, Bld: 125 mg/dL — ABNORMAL HIGH (ref 70–99)
Potassium: 3.8 mmol/L (ref 3.5–5.1)
Sodium: 136 mmol/L (ref 135–145)

## 2019-01-30 MED ORDER — HALOPERIDOL LACTATE 5 MG/ML IJ SOLN
INTRAMUSCULAR | Status: AC
  Filled 2019-01-30: qty 1

## 2019-01-30 MED ORDER — VALPROIC ACID 250 MG/5ML PO SOLN
500.0000 mg | Freq: Two times a day (BID) | ORAL | Status: DC
  Administered 2019-01-30 – 2019-02-09 (×21): 500 mg
  Filled 2019-01-30 (×23): qty 10

## 2019-01-30 MED ORDER — HALOPERIDOL LACTATE 5 MG/ML IJ SOLN
5.0000 mg | INTRAMUSCULAR | Status: DC | PRN
  Administered 2019-01-30 – 2019-02-10 (×12): 5 mg via INTRAVENOUS
  Filled 2019-01-30 (×15): qty 1

## 2019-01-30 MED ORDER — HALOPERIDOL LACTATE 5 MG/ML IJ SOLN
5.0000 mg | Freq: Once | INTRAMUSCULAR | Status: AC
  Administered 2019-01-30: 5 mg via INTRAVENOUS
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
## 2019-01-30 NOTE — Progress Notes (Signed)
Patient very agitated with episode lasting approximately 2 hours. Patient was very combative, attempting to kick and hit staff, kicking bed. Patient is oriented to self and has no neurological changes. Patient had received both Ativan and Dilaudid along with the max rate of precedex, fentanyl, and versed. Dr. Andrey Campanile notified of patient's agitation and ordered one time order of haldol. Will continue to monitor patient.
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
## 2019-01-30 NOTE — Progress Notes (Signed)
Rehab Admissions Coordinator Note:  Per PT recommendation, patient was screened by Stephania Fragmin for appropriateness for an Inpatient Acute Rehab Consult.  Noted pt continued to be demonstrating behaviors most consistent with [place] Level 3.  Will follow along for tolerance and medical improvement.  Please contact me with questions.   Stephania Fragmin 01/30/2019, 2:34 PM  I can be reached at [phone] .

## 2019-01-31 ENCOUNTER — Inpatient Hospital Stay (HOSPITAL_COMMUNITY): Payer: Medicaid Other

## 2019-01-31 ENCOUNTER — Encounter (HOSPITAL_COMMUNITY): Payer: Self-pay

## 2019-01-31 LAB — GLUCOSE, CAPILLARY
GLUCOSE-CAPILLARY: 98 mg/dL (ref 70–99)
Glucose-Capillary: 110 mg/dL — ABNORMAL HIGH (ref 70–99)
Glucose-Capillary: 94 mg/dL (ref 70–99)
Glucose-Capillary: 105 mg/dL — ABNORMAL HIGH (ref 70–99)
Glucose-Capillary: 111 mg/dL — ABNORMAL HIGH (ref 70–99)
Glucose-Capillary: 110 mg/dL — ABNORMAL HIGH (ref 70–99)

## 2019-01-31 LAB — BASIC METABOLIC PANEL
Anion gap: 12 (ref 5–15)
BUN: 12 mg/dL (ref 6–20)
CALCIUM: 10 mg/dL (ref 8.9–10.3)
CO2: 28 mmol/L (ref 22–32)
Chloride: 98 mmol/L (ref 98–111)
Creatinine, Ser: 0.45 mg/dL — ABNORMAL LOW (ref 0.61–1.24)
GFR calc Af Amer: >60 mL/min (ref >60)
Glucose, Bld: 115 mg/dL — ABNORMAL HIGH (ref 70–99)
Potassium: 3.7 mmol/L (ref 3.5–5.1)
Sodium: 138 mmol/L (ref 135–145)

## 2019-01-31 MED ORDER — CLONIDINE HCL 0.1 MG PO TABS
0.4000 mg | ORAL_TABLET | Freq: Four times a day (QID) | ORAL | Status: DC
  Administered 2019-01-31 – 2019-02-03 (×12): 0.4 mg
  Filled 2019-01-31 (×12): qty 4
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
## 2019-01-31 NOTE — Progress Notes (Signed)
Orthopedic Tech Progress Note Patient Details:  Hunter Cook 03/06/1996 [id number]  Patient ID: Hunter Cook, male   DOB: 10-22-1996, 23 y.o.   MRN: [id number]   Hunter Cook 01/31/2019, 9:52 AMCalled [facility] for left resting hand splint.
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
## 2019-01-31 NOTE — Progress Notes (Signed)
Pt pulled out Cortrak despite having restraints placed.  Bridle was left in, have paged Cortrak team to come and place another. Will continue to monitor. Jaclyn Shaggy RN

## 2019-02-01 LAB — GLUCOSE, CAPILLARY
Glucose-Capillary: 114 mg/dL — ABNORMAL HIGH (ref 70–99)
Glucose-Capillary: 99 mg/dL (ref 70–99)
Glucose-Capillary: 101 mg/dL — ABNORMAL HIGH (ref 70–99)
Glucose-Capillary: 108 mg/dL — ABNORMAL HIGH (ref 70–99)
Glucose-Capillary: 100 mg/dL — ABNORMAL HIGH (ref 70–99)
Glucose-Capillary: 112 mg/dL — ABNORMAL HIGH (ref 70–99)
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
## 2019-02-01 NOTE — Plan of Care (Signed)
  Problem: Skin Integrity: Goal: Demonstration of wound healing without infection will improve Outcome: Progressing   
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
## 2019-02-02 DIAGNOSIS — D62 Acute posthemorrhagic anemia: Secondary | ICD-10-CM

## 2019-02-02 DIAGNOSIS — R404 Transient alteration of awareness: Secondary | ICD-10-CM

## 2019-02-02 DIAGNOSIS — J9690 Respiratory failure, unspecified, unspecified whether with hypoxia or hypercapnia: Secondary | ICD-10-CM

## 2019-02-02 DIAGNOSIS — S3609XA Other injury of spleen, initial encounter: Secondary | ICD-10-CM

## 2019-02-02 DIAGNOSIS — Z930 Tracheostomy status: Secondary | ICD-10-CM

## 2019-02-02 DIAGNOSIS — S52272A Monteggia's fracture of left ulna, initial encounter for closed fracture: Secondary | ICD-10-CM

## 2019-02-02 DIAGNOSIS — T148XXA Other injury of unspecified body region, initial encounter: Secondary | ICD-10-CM

## 2019-02-02 DIAGNOSIS — D72829 Elevated white blood cell count, unspecified: Secondary | ICD-10-CM

## 2019-02-02 DIAGNOSIS — S069X9A Unspecified intracranial injury with loss of consciousness of unspecified duration, initial encounter: Secondary | ICD-10-CM

## 2019-02-02 DIAGNOSIS — R451 Restlessness and agitation: Secondary | ICD-10-CM

## 2019-02-02 DIAGNOSIS — J9601 Acute respiratory failure with hypoxia: Secondary | ICD-10-CM

## 2019-02-02 DIAGNOSIS — I615 Nontraumatic intracerebral hemorrhage, intraventricular: Secondary | ICD-10-CM

## 2019-02-02 LAB — CBC
HCT: 34.7 % — ABNORMAL LOW (ref 39.0–52.0)
Hemoglobin: 11.1 g/dL — ABNORMAL LOW (ref 13.0–17.0)
MCH: 27.4 pg (ref 26.0–34.0)
MCHC: 32 g/dL (ref 30.0–36.0)
MCV: 85.7 fL (ref 80.0–100.0)
Platelets: 646 10*3/uL — ABNORMAL HIGH (ref 150–400)
RBC: 4.05 MIL/uL — ABNORMAL LOW (ref 4.22–5.81)
RDW: 16.3 % — ABNORMAL HIGH (ref 11.5–15.5)
WBC: 13.3 10*3/uL — ABNORMAL HIGH (ref 4.0–10.5)
nRBC: 0 % (ref 0.0–0.2)

## 2019-02-02 LAB — GLUCOSE, CAPILLARY
Glucose-Capillary: 95 mg/dL (ref 70–99)
Glucose-Capillary: 100 mg/dL — ABNORMAL HIGH (ref 70–99)
Glucose-Capillary: 125 mg/dL — ABNORMAL HIGH (ref 70–99)
Glucose-Capillary: 107 mg/dL — ABNORMAL HIGH (ref 70–99)
Glucose-Capillary: 109 mg/dL — ABNORMAL HIGH (ref 70–99)
Glucose-Capillary: 110 mg/dL — ABNORMAL HIGH (ref 70–99)
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
## 2019-02-02 NOTE — Procedures (Signed)
Tracheostomy Change Note  Patient Details:   Name: Hunter Cook DOB: 03-19-1996 MRN: [id number]    Airway Documentation:     Evaluation  O2 sats: stable throughout Complications: No apparent complications Patient did tolerate procedure well. Bilateral Breath Sounds: Rhonchi, Diminished    Trach changed per MD order.  Positive color change with ETCO2 detector and productive cough.  Rayburn Felt 02/02/2019, 11:57 AM
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
## 2019-02-02 NOTE — Progress Notes (Signed)
Inpatient Rehab Admissions:  Pt appears to have improved and is now demonstrating behaviors more consistent with Ranchos level IV-V.  Will request Inpatient Rehab Consult order from MD.   Stephania Fragmin, PT, DPT Admissions Coordinator 02/02/19  11:01 AM
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
## 2019-02-03 ENCOUNTER — Encounter (HOSPITAL_COMMUNITY): Payer: Self-pay | Admitting: Physical Medicine and Rehabilitation

## 2019-02-03 LAB — GLUCOSE, CAPILLARY
GLUCOSE-CAPILLARY: 115 mg/dL — AB (ref 70–99)
Glucose-Capillary: 111 mg/dL — ABNORMAL HIGH (ref 70–99)
Glucose-Capillary: 133 mg/dL — ABNORMAL HIGH (ref 70–99)
Glucose-Capillary: 97 mg/dL (ref 70–99)
Glucose-Capillary: 97 mg/dL (ref 70–99)
Glucose-Capillary: 128 mg/dL — ABNORMAL HIGH (ref 70–99)

## 2019-02-03 MED ORDER — DIPHENHYDRAMINE HCL 50 MG/ML IJ SOLN
25.0000 mg | Freq: Four times a day (QID) | INTRAMUSCULAR | Status: DC | PRN
  Administered 2019-02-03: 25 mg via INTRAVENOUS
  Filled 2019-02-03: qty 1

## 2019-02-03 MED ORDER — CLONIDINE HCL 0.1 MG PO TABS
0.5000 mg | ORAL_TABLET | Freq: Four times a day (QID) | ORAL | Status: DC
  Administered 2019-02-03 – 2019-02-04 (×4): 0.5 mg
  Filled 2019-02-03 (×5): qty 5

## 2019-02-03 NOTE — Progress Notes (Signed)
  Speech Language Pathology   Patient Details Name: Hunter Cook MRN: [id number] DOB: [birth year] Today's Date: 02/03/2019 Time:  -      FEES rescheduled for this afternoon at 2:30. Had to cancel at 10- unable to arouse after Precedex turned down due to receiving sedating meds at 6 am.      Darrow Bussing.Ed Nurse, children's [phone] Office [phone]
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
## 2019-02-03 NOTE — Consult Note (Signed)
WOC Nurse wound follow up Patient receiving care in [facility] 4N22.  No family present. Wound type: MDRPI to tracheal area not found, healed.  MDRPI to chin from C-collar now measures 1 cm x 1cm x 0.3 cm is 100% dark pink, clean, no odor, no induration.  Foam dressing in place. Dressing procedure/placement/frequency: Foam dressing; change every 3 days.  Helmut Muster, RN, MSN, CWOCN, CNS-BC, pager [phone]
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
## 2019-02-04 ENCOUNTER — Inpatient Hospital Stay (HOSPITAL_COMMUNITY): Payer: Medicaid Other

## 2019-02-04 LAB — GLUCOSE, CAPILLARY
GLUCOSE-CAPILLARY: 97 mg/dL (ref 70–99)
Glucose-Capillary: 104 mg/dL — ABNORMAL HIGH (ref 70–99)
Glucose-Capillary: 128 mg/dL — ABNORMAL HIGH (ref 70–99)
Glucose-Capillary: 122 mg/dL — ABNORMAL HIGH (ref 70–99)
Glucose-Capillary: 123 mg/dL — ABNORMAL HIGH (ref 70–99)
Glucose-Capillary: 135 mg/dL — ABNORMAL HIGH (ref 70–99)

## 2019-02-04 MED ORDER — CLONIDINE HCL 0.1 MG PO TABS: 0.1000 mg | ORAL_TABLET | Freq: Four times a day (QID) | ORAL | Status: DC

## 2019-02-04 MED ORDER — CLONIDINE HCL 0.1 MG PO TABS: 0.1000 mg | ORAL_TABLET | Freq: Every day | ORAL | Status: DC

## 2019-02-04 MED ORDER — CLONIDINE HCL 0.1 MG PO TABS
0.3000 mg | ORAL_TABLET | Freq: Four times a day (QID) | ORAL | Status: DC
  Administered 2019-02-05 – 2019-02-06 (×3): 0.3 mg via ORAL
  Filled 2019-02-04 (×3): qty 3

## 2019-02-04 MED ORDER — CLONIDINE HCL 0.1 MG PO TABS: 0.2000 mg | ORAL_TABLET | Freq: Every day | ORAL | Status: DC

## 2019-02-04 MED ORDER — CLONIDINE HCL 0.1 MG PO TABS: 0.1000 mg | ORAL_TABLET | Freq: Two times a day (BID) | ORAL | Status: DC

## 2019-02-04 MED ORDER — CLONIDINE HCL 0.1 MG PO TABS: 0.3000 mg | ORAL_TABLET | Freq: Every day | ORAL | Status: DC

## 2019-02-04 MED ORDER — CLONIDINE HCL 0.1 MG PO TABS: 0.4000 mg | ORAL_TABLET | Freq: Every day | ORAL | Status: DC

## 2019-02-04 MED ORDER — CLONIDINE HCL 0.1 MG PO TABS: 0.2000 mg | ORAL_TABLET | Freq: Four times a day (QID) | ORAL | Status: DC

## 2019-02-04 MED ORDER — CLONIDINE HCL 0.1 MG PO TABS
0.4000 mg | ORAL_TABLET | Freq: Four times a day (QID) | ORAL | Status: AC
  Administered 2019-02-04 – 2019-02-05 (×4): 0.4 mg via ORAL
  Filled 2019-02-04 (×4): qty 4

## 2019-02-04 NOTE — Progress Notes (Signed)
Precedex doesn't seem to help relax patient at all. Precedex has been off since 0256 AM. Patient has only been relaxing when Haldol, Dilaudid, & Ativan are given nearly at the same time. This morning this doesn't even seem to work very well. Patient is in low bed now for safety reasons. Patient is on room air and has been sating in the mid to upper 90's. Minimal tracheal suction needed throughout the night.
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
## 2019-02-04 NOTE — Procedures (Signed)
Cortrak  Person Inserting Tube:  Renie Ora, RD Tube Type:  Cortrak - 43 inches Tube Location:  Right nare Initial Placement:  Stomach Secured by: Bridle Technique Used to Measure Tube Placement:  Documented cm marking at nare/ corner of mouth Cortrak Secured At:  70 cm    Cortrak Tube Team Note:  Consult received to place a Cortrak feeding tube. Tube had originally been placed on 2/19, became dislodged, and required replacement on 3/7. Page received from RN this AM that tube had again become dislodged. Bridle was still in place when this RD presented to patient's room. Decision was made to replace bridle in hopes that this was the reason that tube again dislodged this AM.  No x-ray is required. RN may begin using tube.    If the tube becomes dislodged please keep the tube and contact the Cortrak team at [email or web address] (password [email or web address]) for replacement.  If after hours and replacement cannot be delayed, place a NG tube and confirm placement with an abdominal x-ray.      Trenton Gammon, MS, RD, LDN, [facility] Inpatient Clinical Dietitian Pager # [phone] After hours/weekend pager # [phone]
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
## 2019-02-05 LAB — GLUCOSE, CAPILLARY
GLUCOSE-CAPILLARY: 127 mg/dL — AB (ref 70–99)
Glucose-Capillary: 97 mg/dL (ref 70–99)
Glucose-Capillary: 131 mg/dL — ABNORMAL HIGH (ref 70–99)
Glucose-Capillary: 127 mg/dL — ABNORMAL HIGH (ref 70–99)
Glucose-Capillary: 117 mg/dL — ABNORMAL HIGH (ref 70–99)
Glucose-Capillary: 118 mg/dL — ABNORMAL HIGH (ref 70–99)

## 2019-02-05 MED ORDER — QUETIAPINE FUMARATE 100 MG PO TABS
200.0000 mg | ORAL_TABLET | Freq: Three times a day (TID) | ORAL | Status: DC
  Administered 2019-02-05 – 2019-02-09 (×12): 200 mg
  Filled 2019-02-05 (×2): qty 2
  Filled 2019-02-05 (×2): qty 1
  Filled 2019-02-05 (×4): qty 2
  Filled 2019-02-05: qty 1
  Filled 2019-02-05: qty 2
  Filled 2019-02-05: qty 1
  Filled 2019-02-05: qty 2

## 2019-02-05 MED ORDER — METOPROLOL TARTRATE 25 MG/10 ML ORAL SUSPENSION
12.5000 mg | Freq: Two times a day (BID) | ORAL | Status: DC
  Administered 2019-02-05 – 2019-02-09 (×8): 12.5 mg
  Filled 2019-02-05 (×3): qty 5
  Filled 2019-02-05: qty 10
  Filled 2019-02-05 (×2): qty 5
  Filled 2019-02-05: qty 10
  Filled 2019-02-05 (×2): qty 5
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
## 2019-02-05 NOTE — Progress Notes (Signed)
Pt had episode of emesis. TF paused.
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
## 2019-02-05 NOTE — Progress Notes (Signed)
Pt remained emesis free. Restarted TF.

## 2019-02-06 LAB — CBC
HCT: 40.5 % (ref 39.0–52.0)
HEMOGLOBIN: 13 g/dL (ref 13.0–17.0)
MCH: 27.1 pg (ref 26.0–34.0)
MCHC: 32.1 g/dL (ref 30.0–36.0)
MCV: 84.4 fL (ref 80.0–100.0)
Platelets: 694 10*3/uL — ABNORMAL HIGH (ref 150–400)
RBC: 4.8 MIL/uL (ref 4.22–5.81)
RDW: 17.3 % — ABNORMAL HIGH (ref 11.5–15.5)
WBC: 30.6 10*3/uL — AB (ref 4.0–10.5)
nRBC: 0 % (ref 0.0–0.2)

## 2019-02-06 LAB — GLUCOSE, CAPILLARY
Glucose-Capillary: 95 mg/dL (ref 70–99)
Glucose-Capillary: 109 mg/dL — ABNORMAL HIGH (ref 70–99)
Glucose-Capillary: 138 mg/dL — ABNORMAL HIGH (ref 70–99)
Glucose-Capillary: 122 mg/dL — ABNORMAL HIGH (ref 70–99)

## 2019-02-06 MED ORDER — CLONAZEPAM 0.25 MG PO TBDP
1.0000 mg | ORAL_TABLET | Freq: Three times a day (TID) | ORAL | Status: DC
  Administered 2019-02-06 – 2019-02-09 (×9): 1 mg
  Filled 2019-02-06 (×6): qty 4
  Filled 2019-02-06: qty 2
  Filled 2019-02-06 (×2): qty 4

## 2019-02-06 MED ORDER — SULFAMETHOXAZOLE-TRIMETHOPRIM 800-160 MG PO TABS
1.0000 | ORAL_TABLET | Freq: Two times a day (BID) | ORAL | Status: DC
  Administered 2019-02-06 – 2019-02-10 (×9): 1 via ORAL
  Filled 2019-02-06 (×10): qty 1

## 2019-02-06 MED ORDER — CLONIDINE HCL 0.1 MG PO TABS
0.3000 mg | ORAL_TABLET | Freq: Four times a day (QID) | ORAL | Status: AC
  Administered 2019-02-06: 0.3 mg
  Filled 2019-02-06: qty 3

## 2019-02-06 MED ORDER — CLONIDINE HCL 0.1 MG PO TABS: 0.1000 mg | ORAL_TABLET | Freq: Every day | ORAL | Status: DC

## 2019-02-06 MED ORDER — CLONIDINE HCL 0.1 MG PO TABS
0.1000 mg | ORAL_TABLET | Freq: Four times a day (QID) | ORAL | Status: AC
  Administered 2019-02-07 – 2019-02-08 (×4): 0.1 mg
  Filled 2019-02-06 (×4): qty 1

## 2019-02-06 MED ORDER — CLONIDINE HCL 0.1 MG PO TABS
0.1000 mg | ORAL_TABLET | Freq: Two times a day (BID) | ORAL | Status: AC
  Administered 2019-02-08 – 2019-02-09 (×2): 0.1 mg
  Filled 2019-02-06 (×2): qty 1

## 2019-02-06 MED ORDER — CLONIDINE HCL 0.1 MG PO TABS
0.2000 mg | ORAL_TABLET | Freq: Four times a day (QID) | ORAL | Status: AC
  Administered 2019-02-06 – 2019-02-07 (×4): 0.2 mg
  Filled 2019-02-06 (×4): qty 2
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
## 2019-02-06 NOTE — Progress Notes (Signed)
Ortho Trauma Progress Note  Patient remains stable from orthopedic standpoint. Does not speak to me but shakes his head to yes/no questions. Following commands. Incision healing well. No erythema or signs of infection. Gentle range of motion of elbow without significant discomfort. +radial pulse with brisk cap refill. X-rays of left elbow ordered yesterday show some heterotopic ossification around radial head. Fracture line still present  Patient can advance to weightbearing as tolerated to left upper extremity.   Shacoya Burkhammer A. Ladonna [facility] ?[phone]? (phone)
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
## 2019-02-06 NOTE — Procedures (Signed)
Tracheostomy Change Note  Patient Details:   Name: Hunter Cook DOB: 06/08/1996 MRN: [id number]    Airway Documentation:     Evaluation  O2 sats: stable throughout Complications: No apparent complications Patient did tolerate procedure well. Bilateral Breath Sounds: Clear, Diminished   Trach changed to a cuffless # 6 Shiley trach per physician order. Bilateral breath sounds heard and positive color change with ETCO2. Pt tolerated procedure well, no distress noted.   Derinda Late 02/06/2019, 3:05 PM
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
## 2019-02-06 NOTE — Progress Notes (Signed)
Inpatient Rehabilitation Admissions Coordinator  Noted pt has progressed well with therapy today. I will follow up Monday to plan on timing for an inpt rehab admission.   Ottie Glazier, RN, MSN Rehab Admissions Coordinator [phone] 02/06/2019 1:03 PM
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
## 2019-02-07 LAB — CBC
HCT: 40.5 % (ref 39.0–52.0)
Hemoglobin: 13.2 g/dL (ref 13.0–17.0)
MCH: 27.2 pg (ref 26.0–34.0)
MCHC: 32.6 g/dL (ref 30.0–36.0)
MCV: 83.3 fL (ref 80.0–100.0)
Platelets: 633 10*3/uL — ABNORMAL HIGH (ref 150–400)
RBC: 4.86 MIL/uL (ref 4.22–5.81)
RDW: 17.7 % — AB (ref 11.5–15.5)
WBC: 16.8 10*3/uL — ABNORMAL HIGH (ref 4.0–10.5)
nRBC: 0 % (ref 0.0–0.2)

## 2019-02-07 LAB — GLUCOSE, CAPILLARY
Glucose-Capillary: 108 mg/dL — ABNORMAL HIGH (ref 70–99)
Glucose-Capillary: 110 mg/dL — ABNORMAL HIGH (ref 70–99)
Glucose-Capillary: 125 mg/dL — ABNORMAL HIGH (ref 70–99)
Glucose-Capillary: 130 mg/dL — ABNORMAL HIGH (ref 70–99)
Glucose-Capillary: 135 mg/dL — ABNORMAL HIGH (ref 70–99)
Glucose-Capillary: 98 mg/dL (ref 70–99)

## 2019-02-07 MED ORDER — RESOURCE THICKENUP CLEAR PO POWD
ORAL | Status: DC | PRN
  Filled 2019-02-07: qty 125
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
## 2019-02-07 NOTE — Progress Notes (Signed)
   02/07/19 0151  What Happened  Was fall witnessed? Yes  Who witnessed fall? Chakira Jachim, RN  Patients activity before fall other (comment) (Pt out of bed between rails when RN entered room)  Point of contact buttocks  Was patient injured? No  Follow Up  MD notified Marin Olp, MD  Time MD notified [phone]  Additional tests No  Adult Fall Risk Assessment  Risk Factor Category (scoring not indicated) Fall has occurred during this admission (document High fall risk)  Patient Fall Risk Level High fall risk  Adult Fall Risk Interventions  Required Bundle Interventions *See Row Information* High fall risk - low, moderate, and high requirements implemented  Additional Interventions PT/OT need assessed if change in mobility from baseline;Use of appropriate toileting equipment (bedpan, BSC, etc.)  Screening for Fall Injury Risk (To be completed on HIGH fall risk patients) - Assessing Need for Low Bed  Risk For Fall Injury- Low Bed Criteria Previous fall this admission  Will Implement Low Bed and Floor Mats Yes  Screening for Fall Injury Risk (To be completed on HIGH fall risk patients who do not meet crieteria for Low Bed) - Assessing Need for Floor Mats Only  Risk For Fall Injury- Criteria for Floor Mats Confusion/dementia (+CAM, CIWA, TBI, etc.)  Will Implement Floor Mats Yes (Floor mats already in place)  Vitals  Temp 98.2 F (36.8 C)  Temp Source Oral  BP 115/71  MAP (mmHg) 85  BP Location Left Arm  BP Method Automatic  Patient Position (if appropriate) Lying  Pulse Rate (!) 113  Pulse Rate Source Monitor  ECG Heart Rate (!) 152  Cardiac Rhythm SVT (Diamantina Edinger, Rn notified of high hr)  Resp 16  Oxygen Therapy  SpO2 97 %  O2 Device Room Air    0151: RN noticed increased HR on monitor and received call from CCMD while walking to pt's room. RN entered room to check on pt and he was sliding between rails of low bed and slid to floor landing on buttocks.   9528: MD notified.  Received order to place posey belt again.   Pt already had low bed and floor mat was in place.

## 2019-02-08 ENCOUNTER — Inpatient Hospital Stay (HOSPITAL_COMMUNITY): Payer: Medicaid Other

## 2019-02-08 LAB — GLUCOSE, CAPILLARY
GLUCOSE-CAPILLARY: 112 mg/dL — AB (ref 70–99)
Glucose-Capillary: 102 mg/dL — ABNORMAL HIGH (ref 70–99)
Glucose-Capillary: 106 mg/dL — ABNORMAL HIGH (ref 70–99)
Glucose-Capillary: 114 mg/dL — ABNORMAL HIGH (ref 70–99)
Glucose-Capillary: 92 mg/dL (ref 70–99)

## 2019-02-08 LAB — URINE CULTURE

## 2019-02-08 NOTE — Progress Notes (Signed)
RT to called to bedside by RN. Pt had pulled trach out and stated he couldn't breathe. Placed trach and inner cannula back into stoma without any issues. Verified with acsultation and ETCO2 detector. Pt SATs 100% and without any respiratory distress. Cleaned plugs from inner cannula and left humidity aerosol collar due to thick secretions. RT to cont to monitor.
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
## 2019-02-09 ENCOUNTER — Inpatient Hospital Stay (HOSPITAL_COMMUNITY): Payer: Medicaid Other

## 2019-02-09 LAB — CBC WITH DIFFERENTIAL/PLATELET
Abs Immature Granulocytes: 0.04 10*3/uL (ref 0.00–0.07)
Basophils Absolute: 0.1 10*3/uL (ref 0.0–0.1)
Basophils Relative: 1 %
EOS PCT: 4 %
Eosinophils Absolute: 0.5 10*3/uL (ref 0.0–0.5)
HCT: 43.9 % (ref 39.0–52.0)
Hemoglobin: 13.4 g/dL (ref 13.0–17.0)
Immature Granulocytes: 0 %
LYMPHS ABS: 5.5 10*3/uL — AB (ref 0.7–4.0)
Lymphocytes Relative: 49 %
MCH: 25.8 pg — ABNORMAL LOW (ref 26.0–34.0)
MCHC: 30.5 g/dL (ref 30.0–36.0)
MCV: 84.6 fL (ref 80.0–100.0)
Monocytes Absolute: 1.4 10*3/uL — ABNORMAL HIGH (ref 0.1–1.0)
Monocytes Relative: 12 %
Neutro Abs: 3.9 10*3/uL (ref 1.7–7.7)
Neutrophils Relative %: 34 %
Platelets: 647 10*3/uL — ABNORMAL HIGH (ref 150–400)
RBC: 5.19 MIL/uL (ref 4.22–5.81)
RDW: 17.3 % — ABNORMAL HIGH (ref 11.5–15.5)
WBC: 11.5 10*3/uL — ABNORMAL HIGH (ref 4.0–10.5)
nRBC: 0 % (ref 0.0–0.2)

## 2019-02-09 LAB — GLUCOSE, CAPILLARY
Glucose-Capillary: 105 mg/dL — ABNORMAL HIGH (ref 70–99)
Glucose-Capillary: 90 mg/dL (ref 70–99)
Glucose-Capillary: 148 mg/dL — ABNORMAL HIGH (ref 70–99)
Glucose-Capillary: 106 mg/dL — ABNORMAL HIGH (ref 70–99)

## 2019-02-09 MED ORDER — BETHANECHOL CHLORIDE 25 MG PO TABS
25.0000 mg | ORAL_TABLET | Freq: Three times a day (TID) | ORAL | Status: DC
  Administered 2019-02-09 – 2019-02-10 (×3): 25 mg via ORAL
  Filled 2019-02-09 (×3): qty 1

## 2019-02-09 MED ORDER — QUETIAPINE FUMARATE 100 MG PO TABS
100.0000 mg | ORAL_TABLET | Freq: Three times a day (TID) | ORAL | Status: DC
  Administered 2019-02-09: 100 mg
  Filled 2019-02-09: qty 1

## 2019-02-09 MED ORDER — METOPROLOL TARTRATE 25 MG/10 ML ORAL SUSPENSION
12.5000 mg | Freq: Two times a day (BID) | ORAL | Status: DC
  Administered 2019-02-09 – 2019-02-10 (×2): 12.5 mg via ORAL
  Filled 2019-02-09 (×3): qty 5

## 2019-02-09 MED ORDER — CLONIDINE HCL 0.1 MG PO TABS
0.1000 mg | ORAL_TABLET | Freq: Every day | ORAL | Status: AC
  Administered 2019-02-10: 0.1 mg via ORAL
  Filled 2019-02-09: qty 1

## 2019-02-09 MED ORDER — QUETIAPINE FUMARATE 100 MG PO TABS
100.0000 mg | ORAL_TABLET | Freq: Three times a day (TID) | ORAL | Status: DC
  Administered 2019-02-09 – 2019-02-10 (×2): 100 mg via ORAL
  Filled 2019-02-09 (×2): qty 1

## 2019-02-09 MED ORDER — CLONAZEPAM 0.25 MG PO TBDP
0.5000 mg | ORAL_TABLET | Freq: Three times a day (TID) | ORAL | Status: DC
  Administered 2019-02-09: 0.5 mg
  Filled 2019-02-09: qty 2

## 2019-02-09 MED ORDER — ACETAMINOPHEN 325 MG PO TABS: 650.0000 mg | ORAL_TABLET | Freq: Four times a day (QID) | ORAL | Status: DC | PRN

## 2019-02-09 MED ORDER — CLONAZEPAM 0.25 MG PO TBDP
0.5000 mg | ORAL_TABLET | Freq: Three times a day (TID) | ORAL | Status: DC
  Administered 2019-02-09 – 2019-02-10 (×2): 0.5 mg via ORAL
  Filled 2019-02-09 (×2): qty 2

## 2019-02-09 MED ORDER — PANTOPRAZOLE SODIUM 40 MG PO PACK
40.0000 mg | PACK | Freq: Every day | ORAL | Status: DC
  Administered 2019-02-10: 40 mg via ORAL
  Filled 2019-02-09: qty 20

## 2019-02-09 MED ORDER — GUAIFENESIN 100 MG/5ML PO SOLN
5.0000 mL | Freq: Four times a day (QID) | ORAL | Status: DC
  Administered 2019-02-09 – 2019-02-10 (×4): 100 mg via ORAL
  Filled 2019-02-09 (×3): qty 10
  Filled 2019-02-09: qty 20

## 2019-02-09 MED ORDER — IBUPROFEN 100 MG/5ML PO SUSP
400.0000 mg | Freq: Three times a day (TID) | ORAL | Status: DC | PRN
  Filled 2019-02-09: qty 20

## 2019-02-09 MED ORDER — VALPROIC ACID 250 MG/5ML PO SOLN
500.0000 mg | Freq: Two times a day (BID) | ORAL | Status: DC
  Administered 2019-02-09 – 2019-02-10 (×2): 500 mg via ORAL
  Filled 2019-02-09 (×3): qty 10

## 2019-02-09 MED ORDER — ACETAMINOPHEN 160 MG/5ML PO SOLN: 650.0000 mg | Freq: Four times a day (QID) | ORAL | Status: DC | PRN

## 2019-02-09 MED ORDER — OXYCODONE HCL 5 MG/5ML PO SOLN
15.0000 mg | Freq: Four times a day (QID) | ORAL | Status: DC
  Administered 2019-02-09 – 2019-02-10 (×4): 15 mg via ORAL
  Filled 2019-02-09 (×4): qty 15

## 2019-02-09 MED ORDER — ACETAMINOPHEN 650 MG RE SUPP: 650.0000 mg | Freq: Four times a day (QID) | RECTAL | Status: DC | PRN

## 2019-02-09 MED ORDER — DOCUSATE SODIUM 50 MG/5ML PO LIQD: 50.0000 mg | Freq: Every day | ORAL | Status: DC | PRN
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
## 2019-02-09 NOTE — Progress Notes (Addendum)
Modified Barium Swallow Progress Note  Patient Details  Name: Hunter Cook MRN: [id number] Date of Birth: [birth year]  Today's Date: 02/09/2019  Modified Barium Swallow completed.  Full report located under Chart Review in the Imaging Section.  Brief recommendations include the following:  Clinical Impression  Pt's swallow function has improved since FEES performed 3/10 without aspiration and assessed wearing PMV. Oral phase functional with mild lingual residue that fell to valleculae and spontaneously swallowed to clear. Min residue in valleculae (not originating in oral cavity) with delays before sensing and swallowing a second time. Trace, flash penetration x 1 with straw with complete clearing. Regular texture was masticated, contained and propelled to posterior oral cavity in timley manner. Recommend pt have full supervision as he initiates regular texture, thin liquids due to impulsivity. Recommend wear PMV with meals, ills whole in applesauce, small bites/sips and continued ST follow up.        Swallow Evaluation Recommendations       SLP Diet Recommendations: Regular solids;Thin liquid   Liquid Administration via: Cup;Straw   Medication Administration: Whole meds with puree   Supervision: Patient able to self feed;Full supervision/cueing for compensatory strategies   Compensations: Minimize environmental distractions;Slow rate;Small sips/bites(wear speaking valve )   Postural Changes: Seated upright at 90 degrees   Oral Care Recommendations: Oral care BID        Royce Macadamia 02/09/2019,3:18 PM   Breck Coons [place].Ed Nurse, children's [phone] Office [phone]
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
## 2019-02-09 NOTE — Progress Notes (Signed)
Inpatient Rehabilitation Admissions Coordinator  Pt has progressed well and is ready for admission to CIR, but I do not have a bed available today. I spoke with his Dad, by phone and he is aware. I will follow his progress to admit to CIR if bed becomes available.  Ottie Glazier, RN, MSN Rehab Admissions Coordinator [phone] 02/09/2019 12:33 PM
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
## 2019-02-09 NOTE — Progress Notes (Signed)
Orthopedic Tech Progress Note Patient Details:  Hunter Cook 1995-11-29 [id number]  Ortho Devices Type of Ortho Device: Shoulder immobilizer Ortho Device/Splint Interventions: Application   Post Interventions Patient Tolerated: Well Instructions Provided: Care of device   Saul Fordyce 02/09/2019, 12:19 PM
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
## 2019-02-10 ENCOUNTER — Other Ambulatory Visit: Payer: Self-pay

## 2019-02-10 ENCOUNTER — Inpatient Hospital Stay (HOSPITAL_COMMUNITY)
Admission: RE | Admit: 2019-02-10 | Discharge: 2019-02-20 | DRG: 945 | Disposition: A | Discharge status: Home / Self Care | Payer: Medicaid Other | Source: Intra-hospital | Attending: Physical Medicine & Rehabilitation | Admitting: Physical Medicine & Rehabilitation

## 2019-02-10 ENCOUNTER — Encounter (HOSPITAL_COMMUNITY): Payer: Self-pay

## 2019-02-10 DIAGNOSIS — S12600D Unspecified displaced fracture of seventh cervical vertebra, subsequent encounter for fracture with routine healing: Secondary | ICD-10-CM | POA: Diagnosis not present

## 2019-02-10 DIAGNOSIS — K5900 Constipation, unspecified: Secondary | ICD-10-CM | POA: Diagnosis present

## 2019-02-10 DIAGNOSIS — F1010 Alcohol abuse, uncomplicated: Secondary | ICD-10-CM | POA: Diagnosis present

## 2019-02-10 DIAGNOSIS — B9620 Unspecified Escherichia coli [E. coli] as the cause of diseases classified elsewhere: Secondary | ICD-10-CM | POA: Diagnosis present

## 2019-02-10 DIAGNOSIS — S22049D Unspecified fracture of fourth thoracic vertebra, subsequent encounter for fracture with routine healing: Secondary | ICD-10-CM

## 2019-02-10 DIAGNOSIS — R000 Tachycardia, unspecified: Secondary | ICD-10-CM | POA: Diagnosis not present

## 2019-02-10 DIAGNOSIS — D62 Acute posthemorrhagic anemia: Secondary | ICD-10-CM | POA: Diagnosis present

## 2019-02-10 DIAGNOSIS — Z9081 Acquired absence of spleen: Secondary | ICD-10-CM | POA: Diagnosis not present

## 2019-02-10 DIAGNOSIS — M25571 Pain in right ankle and joints of right foot: Secondary | ICD-10-CM | POA: Diagnosis not present

## 2019-02-10 DIAGNOSIS — S069X9A Unspecified intracranial injury with loss of consciousness of unspecified duration, initial encounter: Secondary | ICD-10-CM | POA: Diagnosis present

## 2019-02-10 DIAGNOSIS — S42112D Displaced fracture of body of scapula, left shoulder, subsequent encounter for fracture with routine healing: Secondary | ICD-10-CM | POA: Diagnosis not present

## 2019-02-10 DIAGNOSIS — F17210 Nicotine dependence, cigarettes, uncomplicated: Secondary | ICD-10-CM | POA: Diagnosis present

## 2019-02-10 DIAGNOSIS — I10 Essential (primary) hypertension: Secondary | ICD-10-CM | POA: Diagnosis present

## 2019-02-10 DIAGNOSIS — R1310 Dysphagia, unspecified: Secondary | ICD-10-CM | POA: Diagnosis present

## 2019-02-10 DIAGNOSIS — S06369D Traumatic hemorrhage of cerebrum, unspecified, with loss of consciousness of unspecified duration, subsequent encounter: Principal | ICD-10-CM

## 2019-02-10 DIAGNOSIS — Z8249 Family history of ischemic heart disease and other diseases of the circulatory system: Secondary | ICD-10-CM | POA: Diagnosis not present

## 2019-02-10 DIAGNOSIS — Z930 Tracheostomy status: Secondary | ICD-10-CM | POA: Diagnosis not present

## 2019-02-10 DIAGNOSIS — N390 Urinary tract infection, site not specified: Secondary | ICD-10-CM | POA: Diagnosis present

## 2019-02-10 DIAGNOSIS — S2242XD Multiple fractures of ribs, left side, subsequent encounter for fracture with routine healing: Secondary | ICD-10-CM | POA: Diagnosis not present

## 2019-02-10 DIAGNOSIS — R4189 Other symptoms and signs involving cognitive functions and awareness: Secondary | ICD-10-CM | POA: Diagnosis present

## 2019-02-10 DIAGNOSIS — S069X2S Unspecified intracranial injury with loss of consciousness of 31 minutes to 59 minutes, sequela: Secondary | ICD-10-CM

## 2019-02-10 DIAGNOSIS — Z9119 Patient's noncompliance with other medical treatment and regimen: Secondary | ICD-10-CM | POA: Diagnosis not present

## 2019-02-10 DIAGNOSIS — R52 Pain, unspecified: Secondary | ICD-10-CM

## 2019-02-10 DIAGNOSIS — S069XAA Unspecified intracranial injury with loss of consciousness status unknown, initial encounter: Secondary | ICD-10-CM | POA: Diagnosis present

## 2019-02-10 DIAGNOSIS — S22048S Other fracture of fourth thoracic vertebra, sequela: Secondary | ICD-10-CM

## 2019-02-10 LAB — CBC
HCT: 42.4 % (ref 39.0–52.0)
Hemoglobin: 13.1 g/dL (ref 13.0–17.0)
MCH: 25.9 pg — ABNORMAL LOW (ref 26.0–34.0)
MCHC: 30.9 g/dL (ref 30.0–36.0)
MCV: 84 fL (ref 80.0–100.0)
Platelets: 649 10*3/uL — ABNORMAL HIGH (ref 150–400)
RBC: 5.05 MIL/uL (ref 4.22–5.81)
RDW: 17.2 % — AB (ref 11.5–15.5)
WBC: 8.8 10*3/uL (ref 4.0–10.5)
nRBC: 0 % (ref 0.0–0.2)

## 2019-02-10 LAB — CREATININE, SERUM: Creatinine, Ser: 0.81 mg/dL (ref 0.61–1.24)

## 2019-02-10 MED ORDER — ENSURE ENLIVE PO LIQD
237.0000 mL | Freq: Three times a day (TID) | ORAL | Status: DC
  Administered 2019-02-10: 237 mL via ORAL

## 2019-02-10 MED ORDER — CLONAZEPAM 0.25 MG PO TBDP: 0.5000 mg | ORAL_TABLET | Freq: Two times a day (BID) | ORAL | Status: DC

## 2019-02-10 MED ORDER — SORBITOL 70 % SOLN: 30.0000 mL | Freq: Every day | Status: DC | PRN

## 2019-02-10 MED ORDER — CLONAZEPAM 0.5 MG PO TBDP
0.5000 mg | ORAL_TABLET | Freq: Two times a day (BID) | ORAL | Status: DC
  Administered 2019-02-10 – 2019-02-19 (×18): 0.5 mg via ORAL
  Filled 2019-02-10 (×19): qty 1

## 2019-02-10 MED ORDER — DOCUSATE SODIUM 50 MG/5ML PO LIQD: 50.0000 mg | Freq: Every day | ORAL | Status: DC | PRN

## 2019-02-10 MED ORDER — POLYETHYLENE GLYCOL 3350 17 G PO PACK: 17.0000 g | PACK | Freq: Every day | ORAL | Status: DC | PRN

## 2019-02-10 MED ORDER — ONDANSETRON HCL 4 MG/2ML IJ SOLN: 4.0000 mg | Freq: Four times a day (QID) | INTRAMUSCULAR | Status: DC | PRN

## 2019-02-10 MED ORDER — QUETIAPINE FUMARATE 50 MG PO TABS
50.0000 mg | ORAL_TABLET | Freq: Three times a day (TID) | ORAL | Status: DC
  Administered 2019-02-10: 50 mg via ORAL
  Filled 2019-02-10: qty 1

## 2019-02-10 MED ORDER — ONDANSETRON 4 MG PO TBDP
4.0000 mg | ORAL_TABLET | Freq: Four times a day (QID) | ORAL | Status: DC | PRN
  Filled 2019-02-10: qty 1

## 2019-02-10 MED ORDER — ACETAMINOPHEN 160 MG/5ML PO SOLN
650.0000 mg | Freq: Four times a day (QID) | ORAL | Status: DC | PRN
  Filled 2019-02-10: qty 20.3

## 2019-02-10 MED ORDER — QUETIAPINE FUMARATE 50 MG PO TABS
50.0000 mg | ORAL_TABLET | Freq: Three times a day (TID) | ORAL | Status: DC
  Administered 2019-02-10 – 2019-02-16 (×17): 50 mg via ORAL
  Filled 2019-02-10 (×17): qty 1

## 2019-02-10 MED ORDER — VALPROIC ACID 250 MG/5ML PO SOLN
500.0000 mg | Freq: Two times a day (BID) | ORAL | Status: DC
  Administered 2019-02-10 – 2019-02-13 (×6): 500 mg via ORAL
  Filled 2019-02-10 (×6): qty 10

## 2019-02-10 MED ORDER — SULFAMETHOXAZOLE-TRIMETHOPRIM 800-160 MG PO TABS
1.0000 | ORAL_TABLET | Freq: Two times a day (BID) | ORAL | Status: DC
  Administered 2019-02-10 – 2019-02-16 (×12): 1 via ORAL
  Filled 2019-02-10 (×12): qty 1

## 2019-02-10 MED ORDER — ACETAMINOPHEN 650 MG RE SUPP: 650.0000 mg | Freq: Four times a day (QID) | RECTAL | Status: DC | PRN

## 2019-02-10 MED ORDER — ENOXAPARIN SODIUM 40 MG/0.4ML ~~LOC~~ SOLN
40.0000 mg | SUBCUTANEOUS | Status: DC
  Administered 2019-02-11: 40 mg via SUBCUTANEOUS
  Filled 2019-02-10 (×2): qty 0.4

## 2019-02-10 MED ORDER — ENOXAPARIN SODIUM 40 MG/0.4ML ~~LOC~~ SOLN
40.0000 mg | SUBCUTANEOUS | Status: DC
Start: 2019-02-11 — End: 2019-02-10

## 2019-02-10 MED ORDER — OXYCODONE HCL 5 MG/5ML PO SOLN
15.0000 mg | Freq: Four times a day (QID) | ORAL | Status: DC
  Administered 2019-02-10 – 2019-02-13 (×10): 15 mg via ORAL
  Filled 2019-02-10 (×10): qty 15

## 2019-02-10 MED ORDER — IPRATROPIUM-ALBUTEROL 0.5-2.5 (3) MG/3ML IN SOLN: 3.0000 mL | RESPIRATORY_TRACT | Status: DC | PRN

## 2019-02-10 MED ORDER — GUAIFENESIN 100 MG/5ML PO SOLN
5.0000 mL | Freq: Four times a day (QID) | ORAL | Status: DC
  Administered 2019-02-10 – 2019-02-19 (×33): 100 mg via ORAL
  Filled 2019-02-10: qty 5
  Filled 2019-02-10: qty 25
  Filled 2019-02-10 (×2): qty 5
  Filled 2019-02-10: qty 25
  Filled 2019-02-10 (×5): qty 5
  Filled 2019-02-10: qty 25
  Filled 2019-02-10 (×2): qty 5
  Filled 2019-02-10: qty 25
  Filled 2019-02-10 (×13): qty 5
  Filled 2019-02-10: qty 25
  Filled 2019-02-10 (×2): qty 5
  Filled 2019-02-10 (×2): qty 25
  Filled 2019-02-10: qty 5
  Filled 2019-02-10: qty 25
  Filled 2019-02-10 (×2): qty 5
  Filled 2019-02-10: qty 25

## 2019-02-10 MED ORDER — PANTOPRAZOLE SODIUM 40 MG PO PACK
40.0000 mg | PACK | Freq: Every day | ORAL | Status: DC
  Administered 2019-02-11 – 2019-02-13 (×2): 40 mg via ORAL
  Filled 2019-02-10 (×2): qty 20

## 2019-02-10 MED ORDER — BETHANECHOL CHLORIDE 25 MG PO TABS
25.0000 mg | ORAL_TABLET | Freq: Three times a day (TID) | ORAL | Status: DC
  Administered 2019-02-10 – 2019-02-13 (×8): 25 mg via ORAL
  Filled 2019-02-10 (×8): qty 1

## 2019-02-10 MED ORDER — METOPROLOL TARTRATE 25 MG/10 ML ORAL SUSPENSION
12.5000 mg | Freq: Two times a day (BID) | ORAL | Status: DC
  Administered 2019-02-10: 12.5 mg via ORAL
  Filled 2019-02-10 (×2): qty 5

## 2019-02-10 MED ORDER — ACETAMINOPHEN 325 MG PO TABS
650.0000 mg | ORAL_TABLET | Freq: Four times a day (QID) | ORAL | Status: DC | PRN
  Administered 2019-02-11 – 2019-02-20 (×14): 650 mg via ORAL
  Filled 2019-02-10 (×15): qty 2

## 2019-02-10 MED ORDER — ENSURE ENLIVE PO LIQD
237.0000 mL | Freq: Three times a day (TID) | ORAL | Status: DC
  Administered 2019-02-10 – 2019-02-18 (×5): 237 mL via ORAL
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
## 2019-02-10 NOTE — Progress Notes (Signed)
Report given.  Pharmacy called to change over liquid medication to pill form as patient is on a regular diet after passing swallow on yesterday.
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
## 2019-02-10 NOTE — Consult Note (Signed)
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  Anticipate complete reepithelialization in near future with enough tensile strength to go without dressing.  WOC nursing team will continue to follow, and will remain available to this patient, the nursing and medical teams. Thanks, Ladona Mow, MSN, RN, GNP, [facility]  Pager# [phone]
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
## 2019-02-11 ENCOUNTER — Encounter (HOSPITAL_COMMUNITY): Payer: Self-pay

## 2019-02-11 ENCOUNTER — Inpatient Hospital Stay (HOSPITAL_COMMUNITY): Payer: Self-pay

## 2019-02-11 ENCOUNTER — Inpatient Hospital Stay (HOSPITAL_COMMUNITY): Payer: Self-pay | Admitting: Speech Pathology

## 2019-02-11 ENCOUNTER — Inpatient Hospital Stay (HOSPITAL_COMMUNITY): Payer: Medicaid Other

## 2019-02-11 DIAGNOSIS — S42192S Fracture of other part of scapula, left shoulder, sequela: Secondary | ICD-10-CM

## 2019-02-11 DIAGNOSIS — M7989 Other specified soft tissue disorders: Secondary | ICD-10-CM

## 2019-02-11 DIAGNOSIS — R000 Tachycardia, unspecified: Secondary | ICD-10-CM

## 2019-02-11 DIAGNOSIS — Z930 Tracheostomy status: Secondary | ICD-10-CM

## 2019-02-11 LAB — CBC WITH DIFFERENTIAL/PLATELET
Abs Immature Granulocytes: 0.02 10*3/uL (ref 0.00–0.07)
BASOS ABS: 0.1 10*3/uL (ref 0.0–0.1)
Basophils Relative: 1 %
EOS PCT: 4 %
Eosinophils Absolute: 0.4 10*3/uL (ref 0.0–0.5)
HCT: 40.4 % (ref 39.0–52.0)
Hemoglobin: 12.6 g/dL — ABNORMAL LOW (ref 13.0–17.0)
IMMATURE GRANULOCYTES: 0 %
Lymphocytes Relative: 45 %
Lymphs Abs: 4.8 10*3/uL — ABNORMAL HIGH (ref 0.7–4.0)
MCH: 25.9 pg — ABNORMAL LOW (ref 26.0–34.0)
MCHC: 31.2 g/dL (ref 30.0–36.0)
MCV: 83 fL (ref 80.0–100.0)
Monocytes Absolute: 0.9 10*3/uL (ref 0.1–1.0)
Monocytes Relative: 9 %
Neutro Abs: 4.3 10*3/uL (ref 1.7–7.7)
Neutrophils Relative %: 41 %
Platelets: 601 10*3/uL — ABNORMAL HIGH (ref 150–400)
RBC: 4.87 MIL/uL (ref 4.22–5.81)
RDW: 17.2 % — AB (ref 11.5–15.5)
WBC: 10.5 10*3/uL (ref 4.0–10.5)
nRBC: 0 % (ref 0.0–0.2)

## 2019-02-11 LAB — COMPREHENSIVE METABOLIC PANEL
ALBUMIN: 3.7 g/dL (ref 3.5–5.0)
ALT: 25 U/L (ref 0–44)
AST: 19 U/L (ref 15–41)
Alkaline Phosphatase: 107 U/L (ref 38–126)
Anion gap: 12 (ref 5–15)
BUN: 16 mg/dL (ref 6–20)
CO2: 25 mmol/L (ref 22–32)
Calcium: 9.4 mg/dL (ref 8.9–10.3)
Chloride: 101 mmol/L (ref 98–111)
Creatinine, Ser: 0.81 mg/dL (ref 0.61–1.24)
GFR calc Af Amer: >60 mL/min (ref >60)
GFR calc non Af Amer: >60 mL/min (ref >60)
Glucose, Bld: 97 mg/dL (ref 70–99)
Potassium: 3.6 mmol/L (ref 3.5–5.1)
Sodium: 138 mmol/L (ref 135–145)
Total Bilirubin: 0.9 mg/dL (ref 0.3–1.2)
Total Protein: 7.8 g/dL (ref 6.5–8.1)

## 2019-02-11 MED ORDER — PROPRANOLOL HCL 20 MG PO TABS
20.0000 mg | ORAL_TABLET | Freq: Three times a day (TID) | ORAL | Status: DC
  Administered 2019-02-11 – 2019-02-13 (×7): 20 mg via ORAL
  Filled 2019-02-11 (×7): qty 1

## 2019-02-11 MED ORDER — COLLAGENASE 250 UNIT/GM EX OINT
TOPICAL_OINTMENT | Freq: Every day | CUTANEOUS | Status: DC
  Administered 2019-02-11: 15:00:00 via TOPICAL
  Administered 2019-02-12: 1 via TOPICAL
  Filled 2019-02-11: qty 30
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
## 2019-02-11 NOTE — Progress Notes (Signed)
Occupational Therapy Session Note  Patient Details  Name: Hunter Cook MRN: [id number] Date of Birth: 04/02/1996  Today's Date: 02/11/2019 OT Individual Time: 1400-1426 OT Individual Time Calculation (min): 26 min    Short Term Goals: Week 1:     Skilled Therapeutic Interventions/Progress Updates:    1:1. Pt received in bed direct handoff from PT. Pt engaged in conversation throughout session to improve awareness of deficits impacting functional outcome during BADL/IADL/Vocation. Pt completes dynamometer RUE: 70#, 60#, 60# LUE 10#, 7#, 7#. Pt states that, "this makes me discouraged, its so weak." pt dons gown and OT continues education with hemi technqiues. Pt able to thread LUE first and pt able to recall this strategy. Exited session with pt seated in bed, exit alarm on and call light in reach. Therapy Documentation Precautions:  Precautions Precautions: Cervical Precaution Comments: trach Required Braces or Orthoses: Cervical Brace, Sling(sling for LUE for ambulation per pt request) Cervical Brace: Hard collar, At all times Restrictions Weight Bearing Restrictions: No LUE Weight Bearing: Non weight bearing General:     Therapy/Group: Individual Therapy  [name] 02/11/2019, 2:33 PM
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
## 2019-02-11 NOTE — Progress Notes (Signed)
Bilateral lower extremity venous duplex has been completed. Preliminary results can be found in CV Proc through chart review.   02/11/19 11:35 AM Olen Cordial RVT

## 2019-02-11 NOTE — Progress Notes (Signed)
Inpatient Rehabilitation  Patient information reviewed and entered into eRehab system by Kamorah Nevils M. Aleicia Kenagy, M.A., CCC/SLP, PPS Coordinator.  Information including medical coding, functional ability and quality indicators will be reviewed and updated through discharge.    

## 2019-02-11 NOTE — Progress Notes (Signed)
Changed Patient's trach to a 4 cuffless without any difficulty.  ETCO2 detector confirms placement.  Sat 98% on RA with PMV in place.  Pt RR 20 and talking fine.  Obturator placed at head of bed.

## 2019-02-12 ENCOUNTER — Inpatient Hospital Stay (HOSPITAL_COMMUNITY): Payer: Self-pay

## 2019-02-12 ENCOUNTER — Inpatient Hospital Stay (HOSPITAL_COMMUNITY): Payer: Self-pay | Admitting: Speech Pathology

## 2019-02-12 ENCOUNTER — Inpatient Hospital Stay (HOSPITAL_COMMUNITY): Payer: Self-pay | Admitting: Physical Therapy

## 2019-02-12 DIAGNOSIS — R4689 Other symptoms and signs involving appearance and behavior: Secondary | ICD-10-CM

## 2019-02-12 DIAGNOSIS — S069X0S Unspecified intracranial injury without loss of consciousness, sequela: Secondary | ICD-10-CM

## 2019-02-12 NOTE — Progress Notes (Signed)
Occupational Therapy Session Note  Patient Details  Name: Hunter Cook MRN: [id number] Date of Birth: [birth year]  Today's Date: 02/12/2019 OT Individual Time: 1330-1440 OT Individual Time Calculation (min): 70 min    Short Term Goals: Week 1:  OT Short Term Goal 1 (Week 1): Pt will don shirt using hemi technique with min A OT Short Term Goal 2 (Week 1): Pt will demonstrate intellectual awareness re deficits with min cueing OT Short Term Goal 3 (Week 1): Pt will don pants with min A OT Short Term Goal 4 (Week 1): Pt will increase L shoulder flex to 30 degrees in prep for functional ADL use   Skilled Therapeutic Interventions/Progress Updates:    1:1. Pt initially shaking head at OT when entering room. Education on cervical precautions provided and pt stating, "I dont feel like it." OT shows pt extra cervical collar pads and pt realizes he can shower. OT covers trach with shower cover as well as IV. Pt ambulates with 1 LOB to L with mod A to recover. Pt bathes sit to stand with LOB in standing as stated above when washing peri area and buttocks. Pt completes dressing sit to stand from [facility] for LB dressing. Education provided to thread BLE and dry BLE in seated for safety. Pt able to advance pant past hips wht CGA. Pt lies supine for OT to change C collar pads with VC for keeping head still. Pt completes UB dressing with MOD A for threading BLE into shirt. Pt requires mod A for donning socks. Pt completes standing weight shifting activity on wii balance board to bring awareness to balnace deficits. Pt states, "wow Im really bad" however continues to talk about going home tomorrow. Exited session with pt seated in w/c, call light in reach and all needs met  Therapy Documentation Precautions:  Precautions Precautions: Cervical Precaution Comments: trach Required Braces or Orthoses: Cervical Brace, Sling(sling for comfort) Cervical Brace: Hard collar, At all times Restrictions Weight Bearing  Restrictions: Yes LUE Weight Bearing: Weight bearing as tolerated General:   Vital Signs: Therapy Vitals Pulse Rate: 96 Resp: 16 Oxygen Therapy SpO2: 98 % O2 Device: Room Air Pain:   ADL: ADL Eating: Not assessed Where Assessed-Eating: Chair Grooming: Setup Where Assessed-Grooming: Sitting at sink Upper Body Bathing: Minimal assistance Where Assessed-Upper Body Bathing: Edge of bed Lower Body Bathing: Minimal assistance Where Assessed-Lower Body Bathing: Edge of bed Upper Body Dressing: Moderate assistance, Moderate cueing Where Assessed-Upper Body Dressing: Edge of bed Lower Body Dressing: Moderate cueing, Moderate assistance Where Assessed-Lower Body Dressing: Edge of bed Toileting: Minimal assistance Where Assessed-Toileting: Glass blower/designer: Psychiatric nurse Method: Counselling psychologist: Grab bars Tub/Shower Transfer: Unable to assess [employer] Transfer: Unable to assess   Therapy/Group: Individual Therapy  Tonny Branch 02/12/2019, 2:41 PM

## 2019-02-12 NOTE — Progress Notes (Signed)
Patient's trach capped per MD order. Patient is tolerating trach capping well at this time. Patient states that he isn't having any difficulty breathing. Vitals are within normal limits.

## 2019-02-12 NOTE — Plan of Care (Signed)
  Problem: Consults Goal: RH BRAIN INJURY PATIENT EDUCATION Description Description: See Patient Education module for eduction specifics Outcome: Progressing   Problem: RH SKIN INTEGRITY Goal: RH STG ABLE TO PERFORM INCISION/WOUND CARE W/ASSISTANCE Description STG Able To Perform Incision/Wound Care With [employer].  Outcome: Progressing   Problem: RH SAFETY Goal: RH STG ADHERE TO SAFETY PRECAUTIONS W/ASSISTANCE/DEVICE Description STG Adhere to Safety Precautions With Min Assistance/Device.  Outcome: Progressing Goal: RH STG DECREASED RISK OF FALL WITH ASSISTANCE Description STG Decreased Risk of Fall With [employer].  Outcome: Progressing   Problem: RH COGNITION-NURSING Goal: RH STG USES MEMORY AIDS/STRATEGIES W/ASSIST TO PROBLEM SOLVE Description STG Uses Memory Aids/Strategies With Min Assistance to Problem Solve.  Outcome: Progressing Goal: RH STG ANTICIPATES NEEDS/CALLS FOR ASSIST W/ASSIST/CUES Description STG Anticipates Needs/Calls for Assist With Min Assistance/Cues.  Outcome: Progressing   Problem: RH PAIN MANAGEMENT Goal: RH STG PAIN MANAGED AT OR BELOW PT'S PAIN GOAL Description < 3 out of 10.   Outcome: Not Progressing

## 2019-02-12 NOTE — Progress Notes (Signed)
Patient has a order for restraints, staff did not have to use restraints on patient during 7p-7a shift.
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
## 2019-02-12 NOTE — Progress Notes (Signed)
Physical Therapy Session Note  Patient Details  Name: Napolean Sidman MRN: [id number] Date of Birth: 1996-07-12  Today's Date: 02/12/2019 PT Individual Time: 0705-0758 PT Individual Time Calculation (min): 53 min   Short Term Goals: Week 1:  PT Short Term Goal 1 (Week 1): pt will perform sit>< stand consistently with min assist or less assistance PT Short Term Goal 2 (Week 1): pt will perform gait x 200' without AD, with min assist consistently PT Short Term Goal 3 (Week 1): pt will ascend/descend 2 steps without rails with LRAD, mod assist  Skilled Therapeutic Interventions/Progress Updates:    pt in bed, requires encouragement and significant time for PT to develop rapport before pt agreeable to out of bed activity. Pt performs gait in controlled environment with CGA. Gait with obstacle negotiation with pt requiring mod cues to avoid obstacles, min A for balance.  Standing tap ups and step ups with mod/max A for balance during SLS on Rt LE vs min A for SLS on Lt LE.  Pt set up for breakfast with mod/max cuing for awareness of deficits and anticipatory awareness of effects of this injury on return to work.  Pt left in room with RN present, needs at hand, alarm set.  Therapy Documentation Precautions:  Precautions Precautions: Cervical Precaution Comments: trach Required Braces or Orthoses: Cervical Brace, Sling(sling for comfort) Cervical Brace: Hard collar, At all times Restrictions Weight Bearing Restrictions: Yes LUE Weight Bearing: Weight bearing as tolerated Pain:  pt c/o "bad" pain in Lt UE, RN made aware, sling donned   Therapy/Group: Individual Therapy  Ariellah Faust 02/12/2019, 7:58 AM

## 2019-02-12 NOTE — Progress Notes (Signed)
Speech Language Pathology Daily Session Note  Patient Details  Name: Hunter Cook MRN: [id number] Date of Birth: 1996-08-11  Today's Date: 02/12/2019 SLP Individual Time: 1015-1110 SLP Individual Time Calculation (min): 55 min  Short Term Goals: Week 1: SLP Short Term Goal 1 (Week 1): Patient will consume current diet without overt s/s of aspiration and Mod I for use of swallowing compensatory strategies.  SLP Short Term Goal 2 (Week 1): Patient will demonstrate functional problem solving for mildly complex tasks with Min A verbal cues.  SLP Short Term Goal 3 (Week 1): Patient will recall new, daily information with Min A verbal and visual cues.  SLP Short Term Goal 4 (Week 1): Patient will demonstrate selective attention to functional tasks ~30 minutes in a mildly distracting enviornment with Min A verbal cues for redirection.  SLP Short Term Goal 5 (Week 1): Patient will self-monitor and correct errors during functional tasks with Min A verbal cues.   Skilled Therapeutic Interventions: Skilled treatment session focused on cognitive goals. SLP facilitated session by providing Mod-Max A verbal cues for emergent awareness in regards to why clinician has to assist patient in the bathroom. Patient eventually agreeable and was continent of bowel and bladder. Patient participated in a basic money management task and initially required Max A verbal cues for problem solving, however, patient only required Min A verbal cues by end of session, suspect due to improved arousal and attention. Patient continues to demonstrate intermittent and fleeting awareness of deficits and their impact on his function for discharge but requires continues reinforcement. Patient left upright in bed with alarm on and all needs within reach. Continue with current plan of care.      Pain Pain in left arm, patient premedicated and repositioned   Therapy/Group: Individual Therapy  Hunter Cook 02/12/2019, 11:37 AM

## 2019-02-13 ENCOUNTER — Inpatient Hospital Stay (HOSPITAL_COMMUNITY): Payer: Self-pay | Admitting: Speech Pathology

## 2019-02-13 ENCOUNTER — Inpatient Hospital Stay (HOSPITAL_COMMUNITY): Payer: Self-pay | Admitting: Occupational Therapy

## 2019-02-13 ENCOUNTER — Inpatient Hospital Stay (HOSPITAL_COMMUNITY): Payer: Self-pay | Admitting: Physical Therapy

## 2019-02-13 ENCOUNTER — Inpatient Hospital Stay (HOSPITAL_COMMUNITY): Payer: Self-pay

## 2019-02-13 MED ORDER — VALPROIC ACID 250 MG PO CAPS
500.0000 mg | ORAL_CAPSULE | Freq: Two times a day (BID) | ORAL | Status: DC
  Administered 2019-02-13 – 2019-02-20 (×13): 500 mg via ORAL
  Filled 2019-02-13 (×17): qty 2

## 2019-02-13 MED ORDER — DOCUSATE SODIUM 100 MG PO CAPS: 100.0000 mg | ORAL_CAPSULE | Freq: Every day | ORAL | Status: DC | PRN

## 2019-02-13 MED ORDER — TRAMADOL HCL 50 MG PO TABS
25.0000 mg | ORAL_TABLET | Freq: Two times a day (BID) | ORAL | Status: AC
  Administered 2019-02-13 – 2019-02-14 (×4): 25 mg via ORAL
  Filled 2019-02-13 (×4): qty 1

## 2019-02-13 MED ORDER — PANTOPRAZOLE SODIUM 40 MG PO TBEC
40.0000 mg | DELAYED_RELEASE_TABLET | Freq: Every day | ORAL | Status: DC
  Administered 2019-02-14 – 2019-02-20 (×7): 40 mg via ORAL
  Filled 2019-02-13 (×7): qty 1

## 2019-02-13 MED ORDER — BETHANECHOL CHLORIDE 10 MG PO TABS
10.0000 mg | ORAL_TABLET | Freq: Three times a day (TID) | ORAL | Status: DC
  Administered 2019-02-13 – 2019-02-16 (×9): 10 mg via ORAL
  Filled 2019-02-13 (×9): qty 1

## 2019-02-13 MED ORDER — PROPRANOLOL HCL 20 MG PO TABS
40.0000 mg | ORAL_TABLET | Freq: Three times a day (TID) | ORAL | Status: DC
  Administered 2019-02-13 – 2019-02-20 (×21): 40 mg via ORAL
  Filled 2019-02-13 (×21): qty 2

## 2019-02-13 MED ORDER — OXYCODONE HCL 5 MG PO TABS
15.0000 mg | ORAL_TABLET | Freq: Four times a day (QID) | ORAL | Status: DC
  Administered 2019-02-13 – 2019-02-19 (×23): 15 mg via ORAL
  Filled 2019-02-13 (×23): qty 3

## 2019-02-13 NOTE — Progress Notes (Signed)
Patient alert and oriented and remains trach this shift. C/O of generalized pain throughout shift prn tylenol given and scheduled pain medication as requested and ordered.. Adequate urine output this shift. Patient suction X 1 this shift, Patient sleep 6 hrs this shift. No distress observed. Will continue to monitor

## 2019-02-13 NOTE — Progress Notes (Signed)
Physical Therapy Session Note  Patient Details  Name: Hunter Cook MRN: [id number] Date of Birth: 1995/12/24  Today's Date: 02/13/2019 PT Individual Time: 1130-1145 PT Individual Time Calculation (min): 15 min   Short Term Goals: Week 1:  PT Short Term Goal 1 (Week 1): pt will perform sit>< stand consistently with min assist or less assistance PT Short Term Goal 2 (Week 1): pt will perform gait x 200' without AD, with min assist consistently PT Short Term Goal 3 (Week 1): pt will ascend/descend 2 steps without rails with LRAD, mod assist  Skilled Therapeutic Interventions/Progress Updates:    Pt received seated EOB in room. Pt complaining of 10/10 pain in his "entire left side". RN notified and working on provide patient with pain medication. Pt resistant to participation in therapy session 2/2 pain and just wanting to leave the hospital. Education with patient about importance of mobility and balance training so that he will be safe and can d/c home. Pt declines to participate in any functional mobility. Offered to provide AAROM to LUE due to pain in arm and shoulder region, pt declines. Pt left seated EOB with needs in reach, bed alarm in place. Pt missed 45 min of scheduled therapy session 2/2 pain and refusal to participate.  Therapy Documentation Precautions:  Precautions Precautions: Cervical Precaution Comments: trach Required Braces or Orthoses: Cervical Brace, Sling(sling for comfort) Cervical Brace: Hard collar, At all times Restrictions Weight Bearing Restrictions: Yes LUE Weight Bearing: Weight bearing as tolerated General: PT Amount of Missed Time (min): 45 Minutes PT Missed Treatment Reason: Pain;Patient unwilling to participate  Therapy/Group: Individual Therapy   Peter Congo, PT, DPT  02/13/2019, 12:24 PM

## 2019-02-13 NOTE — Progress Notes (Signed)
Patient has been persistently requesting to go home today but is easily redirected.  Patient is tolerating decannulation very well.  Patient inquiring about how long he will need to wear his aspen collar.  I could not locate any note from neurosurgery detailing how long the collar would be needed.  No follow up imaging has been done since initial injury on 12/19/2018.  Dani Gobble, RN

## 2019-02-13 NOTE — Progress Notes (Signed)
Speech Language Pathology Daily Session Note  Patient Details  Name: Hunter Cook MRN: [id number] Date of Birth: 1996/03/23  Today's Date: 02/13/2019 SLP Individual Time: 1005-1030 SLP Individual Time Calculation (min): 25 min  Short Term Goals: Week 1: SLP Short Term Goal 1 (Week 1): Patient will consume current diet without overt s/s of aspiration and Mod I for use of swallowing compensatory strategies.  SLP Short Term Goal 2 (Week 1): Patient will demonstrate functional problem solving for mildly complex tasks with Min A verbal cues.  SLP Short Term Goal 3 (Week 1): Patient will recall new, daily information with Min A verbal and visual cues.  SLP Short Term Goal 4 (Week 1): Patient will demonstrate selective attention to functional tasks ~30 minutes in a mildly distracting enviornment with Min A verbal cues for redirection.  SLP Short Term Goal 5 (Week 1): Patient will self-monitor and correct errors during functional tasks with Min A verbal cues.   Skilled Therapeutic Interventions:  Pt was seen for skilled ST targeting cognitive and dysphagia goals.  Pt was resting upon arrival but was easily awakened to voice and encouragement to sit at edge of bed for therapies.  Janina Mayo is capped with pt demonstrating good tolerance.   Pt consumed a functional snack of regular textures with mod I use of swallowing precautions and no overt s/s of aspiration.  Pt selectively attended to task in the presence of mild distractions (conversations with SLP, door open to room) with min cues for redirection to task.    Pt verbalized wanting to go home and needed min-mod question cues to identify how his current limitations will impact his functional independence in the home and work environment.  Pt with complaints of 10/10 shoulder pain, RN made aware.  Pt was left resting in bed with bed alarm set and call bell within reach.  Continue per current plan of care.    Pain Pain Assessment Pain Scale: 0-10 Pain  Score: 10-Worst pain ever Pain Location: Shoulder Pain Orientation: Left Pain Descriptors / Indicators: Aching Pain Intervention(s): RN made aware  Therapy/Group: Individual Therapy  Michaiah Holsopple, Melanee Spry 02/13/2019, 10:27 AM

## 2019-02-13 NOTE — IPOC Note (Signed)
Overall Plan of Care [facility]) Patient Details Name: Hunter Cook MRN: [id number] DOB: [birth year]  Admitting Diagnosis: <principal problem not specified>  Hospital Problems: Active Problems:   TBI (traumatic brain injury) (HCC)     Functional Problem List: Nursing Behavior, Endurance, Medication Management, Motor, Pain, Safety, Skin Integrity  PT Balance, Behavior, Endurance, Motor, Safety, Pain  OT Balance, Motor, Sensory, Behavior, Skin Integrity, Cognition, Edema, Endurance, Safety, Perception, Pain  SLP Cognition  TR         Basic ADL's: OT Eating, Bathing, Grooming, Dressing, Toileting     Advanced  ADL's: OT       Transfers: PT Bed Mobility, Bed to Chair, Car, [employer], Floor  OT Tub/Shower, Technical brewer: PT Stairs, Ambulation     Additional Impairments: OT Fuctional Use of Upper Extremity  SLP Swallowing, Social Cognition   Problem Solving, Memory, Attention, Awareness  TR      Anticipated Outcomes Item Anticipated Outcome  Self Feeding set up  Swallowing  Mod I    Basic self-care  (S)  Toileting  (S)   Bathroom Transfers (S)  Bowel/Bladder  Pt will manage bowel and bladder with mod I assist   Transfers  Supervision for basic, floor, car  Locomotion  Supervision gait x 200' controlled and community env, 50' home environment, up/down 4 steps wiht LRAD for home access and up/down 12 steps 1 rail for community access  Communication     Cognition  Supervision  Pain  Pt will manage pain at 3 or less on a scale of 0-10.   Safety/Judgment  Pt will remain free of falls and injury with supervision assist    Therapy Plan: PT Intensity: Minimum of 1-2 x/day ,45 to 90 minutes PT Frequency: 5 out of 7 days PT Duration Estimated Length of Stay: 14 OT Intensity: Minimum of 1-2 x/day, 45 to 90 minutes OT Frequency: 5 out of 7 days OT Duration/Estimated Length of Stay: 14-17 days SLP Intensity: Minumum of 1-2 x/day, 30 to 90 minutes SLP  Frequency: 3 to 5 out of 7 days SLP Duration/Estimated Length of Stay: 10-12 days     Team Interventions: Nursing Interventions Patient/Family Education, Disease Management/Prevention, Pain Management, Skin Care/Wound Management, Cognitive Remediation/Compensation, Medication Management, Discharge Planning  PT interventions Ambulation/gait training, Community reintegration, DME/adaptive equipment instruction, Neuromuscular re-education, Psychosocial support, Stair training, UE/LE Strength taining/ROM, Wheelchair propulsion/positioning, Warden/ranger, Discharge planning, Functional electrical stimulation, Pain management, Therapeutic Activities, UE/LE Coordination activities, Cognitive remediation/compensation, Functional mobility training, Patient/family education, Therapeutic Exercise, Visual/perceptual remediation/compensation, Splinting/orthotics  OT Interventions Warden/ranger, Community reintegration, Disease mangement/prevention, Functional electrical stimulation, Neuromuscular re-education, Patient/family education, Self Care/advanced ADL retraining, Splinting/orthotics, Therapeutic Exercise, UE/LE Coordination activities, Cognitive remediation/compensation, Discharge planning, DME/adaptive equipment instruction, Functional mobility training, Pain management, Psychosocial support, Therapeutic Activities, Skin care/wound managment, UE/LE Strength taining/ROM  SLP Interventions Cognitive remediation/compensation, Environmental controls, Internal/external aids, Therapeutic Activities, Patient/family education, Functional tasks, Dysphagia/aspiration precaution training, Cueing hierarchy  TR Interventions    SW/CM Interventions Discharge Planning, Psychosocial Support, Patient/Family Education   Barriers to Discharge MD  Behavior  Nursing      PT      OT      SLP      SW       Team Discharge Planning: Destination: PT-Home ,OT- Home , SLP-Home Projected  Follow-up: PT-Outpatient PT, OT-  Outpatient OT, SLP-24 hour supervision/assistance, Outpatient SLP, Home Health SLP Projected Equipment Needs: PT-To be determined, OT- To be determined, SLP-None recommended by SLP Equipment Details: PT- ,  OT-  Patient/family involved in discharge planning: PT- Patient,  OT-Patient unable/family or caregiver not available, Patient, SLP-Patient  MD ELOS: 11-14 days Medical Rehab Prognosis:  Excellent Assessment: The patient has been admitted for CIR therapies with the diagnosis of TBI with polytrauma. The team will be addressing functional mobility, strength, stamina, balance, safety, adaptive techniques and equipment, self-care, bowel and bladder mgt, patient and caregiver education, NMR, trach considerations, pain control, family education, swallowing. Goals have been set at supervision for self-care, mobility and swallowing/cognition.    Ranelle Oyster, MD, FAAPMR      See Team Conference Notes for weekly updates to the plan of care

## 2019-02-13 NOTE — Plan of Care (Signed)
  Problem: Consults Goal: RH BRAIN INJURY PATIENT EDUCATION Description Description: See Patient Education module for eduction specifics Outcome: Progressing   Problem: RH SKIN INTEGRITY Goal: RH STG SKIN FREE OF INFECTION/BREAKDOWN Description No new breakdown with min assist   Outcome: Progressing Goal: RH STG ABLE TO PERFORM INCISION/WOUND CARE W/ASSISTANCE Description STG Able To Perform Incision/Wound Care With [employer].  Outcome: Progressing   Problem: RH SAFETY Goal: RH STG ADHERE TO SAFETY PRECAUTIONS W/ASSISTANCE/DEVICE Description STG Adhere to Safety Precautions With Min Assistance/Device.  Outcome: Progressing Goal: RH STG DECREASED RISK OF FALL WITH ASSISTANCE Description STG Decreased Risk of Fall With [employer].  Outcome: Progressing   Problem: RH COGNITION-NURSING Goal: RH STG USES MEMORY AIDS/STRATEGIES W/ASSIST TO PROBLEM SOLVE Description STG Uses Memory Aids/Strategies With Min Assistance to Problem Solve.  Outcome: Progressing Goal: RH STG ANTICIPATES NEEDS/CALLS FOR ASSIST W/ASSIST/CUES Description STG Anticipates Needs/Calls for Assist With Min Assistance/Cues.  Outcome: Progressing   Problem: RH PAIN MANAGEMENT Goal: RH STG PAIN MANAGED AT OR BELOW PT'S PAIN GOAL Description < 3 out of 10.   Outcome: Not Progressing

## 2019-02-13 NOTE — Progress Notes (Signed)
Patients shoulder pain uncontrolled with current regimen.  Spoke with Deatra Ina, PA for advice.  Ordered ultram 25 mg po BID x 2 days and see how pain reacts.  Will continue to monitor.  Dani Gobble, RN

## 2019-02-13 NOTE — Progress Notes (Signed)
Occupational Therapy Session Note  Patient Details  Name: Hunter Cook MRN: [id number] Date of Birth: [birth year]  Today's Date: 02/13/2019 OT Individual Time: 0700-0800 OT Individual Time Calculation (min): 60 min    Short Term Goals: Week 1:  OT Short Term Goal 1 (Week 1): Pt will don shirt using hemi technique with min A OT Short Term Goal 2 (Week 1): Pt will demonstrate intellectual awareness re deficits with min cueing OT Short Term Goal 3 (Week 1): Pt will don pants with min A OT Short Term Goal 4 (Week 1): Pt will increase L shoulder flex to 30 degrees in prep for functional ADL use   Skilled Therapeutic Interventions/Progress Updates:    1;1. Pt reporting dicomfort in LUE, however declines intervention/medication at this time. Pt completes donning shirt with min A for threading LUE and VC for hemi technqiue as well as S for donning shoes EOB. Pt completes oral care in standing at sink with S. Pt dons sling with min A and VC for technique EOB. CGA-min A for ambulation without AD. Scissoring of LE and increased instability during turns noted. Pt also demonstrated sifficulty navigating environment running into object/getting too close to mat table for sitting. Pt completes standing cornhole on standard  Time and compliant foam wedge with up to min A for posterior LOB. Pt completes hand theraputty exercises: full grip, tip to tip pinch and key pinch with demo/instruction cues. Exited session with pt seated in w/c with call light in reach and all needs met  Therapy Documentation Precautions:  Precautions Precautions: Cervical Precaution Comments: trach Required Braces or Orthoses: Cervical Brace, Sling(sling for comfort) Cervical Brace: Hard collar, At all times Restrictions Weight Bearing Restrictions: Yes LUE Weight Bearing: Weight bearing as tolerated General:   Vital Signs: Therapy Vitals Temp: 98.3 F (36.8 C) Temp Source: Oral Pulse Rate: 85 Resp: 18 BP: (!)  148/98 Patient Position (if appropriate): Lying Oxygen Therapy SpO2: 100 % O2 Device: Room Air Pain: Pain Assessment Pain Score: 4  Faces Pain Scale: Hurts a little bit PAINAD (Pain Assessment in Advanced Dementia) Breathing: normal Negative Vocalization: none Facial Expression: smiling or inexpressive Body Language: relaxed Consolability: no need to console PAINAD Score: 0 ADL: ADL Eating: Not assessed Where Assessed-Eating: Chair Grooming: Setup Where Assessed-Grooming: Sitting at sink Upper Body Bathing: Minimal assistance Where Assessed-Upper Body Bathing: Edge of bed Lower Body Bathing: Minimal assistance Where Assessed-Lower Body Bathing: Edge of bed Upper Body Dressing: Moderate assistance, Moderate cueing Where Assessed-Upper Body Dressing: Edge of bed Lower Body Dressing: Moderate cueing, Moderate assistance Where Assessed-Lower Body Dressing: Edge of bed Toileting: Minimal assistance Where Assessed-Toileting: Glass blower/designer: Psychiatric nurse Method: Counselling psychologist: Ambulance person Transfer: Unable to assess [employer] Transfer: Unable to assess Vision   Perception    Praxis   Exercises:   Other Treatments:     Therapy/Group: Individual Therapy  Tonny Branch 02/13/2019, 7:59 AM
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
## 2019-02-13 NOTE — Progress Notes (Signed)
Decanulated pt.  Placed gauze on stoma secured by paper tape.  Patient able to verbalize clearly. Patient SPO2 98% on room air.
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
## 2019-02-13 NOTE — Progress Notes (Signed)
Occupational Therapy Session Note  Patient Details  Name: Hunter Cook MRN: [id number] Date of Birth: Nov 19, 1996  Today's Date: 02/13/2019 OT Individual Time: 1572-6203 OT Individual Time Calculation (min): 45 min    Short Term Goals: Week 1:  OT Short Term Goal 1 (Week 1): Pt will don shirt using hemi technique with min A OT Short Term Goal 2 (Week 1): Pt will demonstrate intellectual awareness re deficits with min cueing OT Short Term Goal 3 (Week 1): Pt will don pants with min A OT Short Term Goal 4 (Week 1): Pt will increase L shoulder flex to 30 degrees in prep for functional ADL use   Skilled Therapeutic Interventions/Progress Updates: Patient participation was as follows:  Initial rapport building and orientation to situation;    Supine in bed to edge of bed transfer=Distant S;   Sat edge of bed to don boots ( set up and extra time).   Patient on his own attempted and initiated incorporating his left arm and hand to assist with donning boots, but he stated too painful.  He donned his sling over left arm with min A to locate and bring strap around right side of head/neck.  Sit to stand from bed= close S without  Losses of balance; He ambulated to sink and brushed and washed his hands, focusing on left as he had stated previously that hand's skin was rough.   As well, he applied location and retrograde massage to the left hand to help decrease joint stiffness and pain for increasing functional bilateral use and for self care.  His dynamic balance was fair and on when his legs fatigued, he was instructed to sit on bed for rest break. He stood for approximately 7 minutes cleaning his hands and applying loation, before his legs began to lightly tremor from fatigue.  He recalled and carried over some of the  previously soft theraputty movements for his hand hand that prior OT had taught him. He was able to dynamically stand for about 2 minutes per try and cross midline with right hand to  complete reaaction speed activities in all 4 quadrants.   He attempted with left hand, but he stated the gravitational pull on his left shoulder/top of forearm was too great.   He donned this sling again with min A.  He was left seated edge of bed working on is laptop with bed alarm engaged and call bell andphone within reach.     Therapy Documentation Precautions:  Precautions Precautions: Cervical Precaution Comments: trach removed 3.20.20 Friday Required Braces or Orthoses: Cervical Brace, Sling(sling for comfort) Cervical Brace: Hard collar, At all times Restrictions Weight Bearing Restrictions: Yes LUE Weight Bearing: Weight bearing as tolerated    Pain:9/10 he pointed to frontal upper arm; aching; RN had previously given pain meds   Therapy/Group: Individual Therapy  [name] 02/13/2019, 6:44 PM

## 2019-02-14 ENCOUNTER — Inpatient Hospital Stay (HOSPITAL_COMMUNITY): Payer: Self-pay | Admitting: Speech Pathology

## 2019-02-14 ENCOUNTER — Inpatient Hospital Stay (HOSPITAL_COMMUNITY): Payer: Self-pay

## 2019-02-14 ENCOUNTER — Inpatient Hospital Stay (HOSPITAL_COMMUNITY): Payer: Self-pay | Admitting: Physical Therapy

## 2019-02-14 ENCOUNTER — Inpatient Hospital Stay (HOSPITAL_COMMUNITY): Payer: Medicaid Other

## 2019-02-14 MED ORDER — OXYCODONE HCL 5 MG PO TABS
10.0000 mg | ORAL_TABLET | Freq: Once | ORAL | Status: AC
  Administered 2019-02-14: 10 mg via ORAL
  Filled 2019-02-14: qty 2

## 2019-02-14 NOTE — Progress Notes (Signed)
Physical Therapy Session Note  Patient Details  Name: Hunter Cook MRN: [id number] Date of Birth: Feb 19, 1996  Today's Date: 02/14/2019 PT Individual Time: 1315-1405 PT Individual Time Calculation (min): 50 min   Short Term Goals: Week 1:  PT Short Term Goal 1 (Week 1): pt will perform sit>< stand consistently with min assist or less assistance PT Short Term Goal 2 (Week 1): pt will perform gait x 200' without AD, with min assist consistently PT Short Term Goal 3 (Week 1): pt will ascend/descend 2 steps without rails with LRAD, mod assist  Skilled Therapeutic Interventions/Progress Updates:  Pt was seen bedside in the pm, sitting on edge of bed. Pt willing to participate with therapy. Pt ambulated to rehab gym with c/g and verbal cues. In gym pt performed multiple sit to stand and stand pivot transfers with c/g and verbal cues. Treatment focused on NMR utilizing cone taps and alternating cone taps 3 sets x 10 reps each. Pt ambulated through slalom course 3 x 50 feet with c/g and verbal cues. Pt ambulated 300 feet with c/g and verbal cues, required min A once for LOB. Pt c/o R ankle/dorsum of foot pain, off and on throughout treatment. During final ambulation pt's c/o increasing pain R ankle/dorsum of foot. Pt returned to room. Pt transferred edge of bed to supine with S. Pt appears to have mild swelling dorsum of R foot as well as muscle spasm with decreased ROM secondary to pain. Pt's nurse at bedside to assess patient. Pt's nurse to follow up with MD. Pt left sitting up in bed with bed alarm on and call bell within reach.  Therapy Documentation Precautions:  Precautions Precautions: Cervical Precaution Comments: trach Required Braces or Orthoses: Cervical Brace, Sling(sling for comfort) Cervical Brace: Hard collar, At all times Restrictions Weight Bearing Restrictions: Yes LUE Weight Bearing: Weight bearing as tolerated General:  Pain: Pain Assessment Pain Scale: 0-10 Pain Score: 6   Pain Type: Acute pain Pain Location: Foot Pain Orientation: Anterior;Right Pain Descriptors / Indicators: Shooting Pain Intervention(s): RN made aware  Therapy/Group: Individual Therapy  Rayford Halsted 02/14/2019, 2:20 PM

## 2019-02-14 NOTE — Progress Notes (Signed)
Patient returned from physical therapy complaining of pain in his right foot at 8/10; patient stated that he had pain in that foot at 4/10 before physical therapy. Upon assessment of his foot, I noticed slight tone in the top of patient's right foot, and he had what appeared to be tremors in his leg/foot. I called Dr. Amador Cunas who gave a one time order for oxycodone 10mg  and an xray of patient's foot.

## 2019-02-14 NOTE — Progress Notes (Signed)
Speech Language Pathology Daily Session Note  Patient Details  Name: Hunter Cook MRN: [id number] Date of Birth: [birth year]  Today's Date: 02/14/2019 SLP Individual Time: 1105-1203 SLP Individual Time Calculation (min): 58 min  Short Term Goals: Week 1: SLP Short Term Goal 1 (Week 1): Patient will consume current diet without overt s/s of aspiration and Mod I for use of swallowing compensatory strategies.  SLP Short Term Goal 2 (Week 1): Patient will demonstrate functional problem solving for mildly complex tasks with Min A verbal cues.  SLP Short Term Goal 3 (Week 1): Patient will recall new, daily information with Min A verbal and visual cues.  SLP Short Term Goal 4 (Week 1): Patient will demonstrate selective attention to functional tasks ~30 minutes in a mildly distracting enviornment with Min A verbal cues for redirection.  SLP Short Term Goal 5 (Week 1): Patient will self-monitor and correct errors during functional tasks with Min A verbal cues.   Skilled Therapeutic Interventions:  Pt was seen for skilled ST targeting cognitive goals.  Pt was resting in bed upon arrival and needed a great deal of encouragement and time to agree to participate in therapies.  Pt ultimately was agreeable and apologized for his reluctance to participate over the last 2 therapy sessions with this particular therapist.  Pt was able to complete medication management portions of the ALFA with mod faded to supervision verbal cues when organizing pills into medication chart.  He was mod I for locating targeted information from medication labels as well as other functional labels and passages during reading subtests of the same assessment.  Pt needed supervision cues to recognize and correct errors when completing functional mental math calculations.   SLP finished session with a novel card game to address problem solving and attention goals.  Initially, pt needed max assist to plan and execute a problem solving  strategy due to decreased working memory of task rules and procedures; however, as task progressed therapist was able to fade cues to supervision.  Pt was left in bed with bed alarm set and call bell within reach.  Continue per current plan of care.     Pain Pain Assessment Pain Scale: 0-10 Pain Score: 10-Worst pain ever Pain Type: Acute pain Pain Location: Shoulder Pain Orientation: Left Pain Descriptors / Indicators: Aching Pain Frequency: Constant Patients Stated Pain Goal: 0 Pain Intervention(s): RN made aware  Therapy/Group: Individual Therapy  Tashai Catino, Melanee Spry 02/14/2019, 12:52 PM

## 2019-02-14 NOTE — Progress Notes (Signed)
Patient alert and oriented this shift; meds as ordered; requires scheduled pain medication and prn meds with minimal pain relief this shift; educated patient on relaxation techniques; patient ambulated this shift with staff assistance  to and from bathroom. Collar on and intact as ordered this shift; no distress observed from trach deconnulation. Will continue to monitor

## 2019-02-14 NOTE — Progress Notes (Signed)
Hunter Cook is a 24 y.o. 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 Bipolar 1 disorder (HCC)   . Bipolar disorder (HCC)   . Snake bite      Subjective: No new complaints. No new problems. Slept well.  Anxious for discharge  Objective: Vital signs in last 24 hours: Temp:  [98 F (36.7 C)-98.8 F (37.1 C)] 98.8 F (37.1 C) (03/20 1942) Pulse Rate:  [73-98] 78 (03/21 0508) Resp:  [17-20] 17 (03/20 1942) BP: (118-156)/(85-112) 144/99 (03/21 0508) SpO2:  [97 %-100 %] 99 % (03/21 0834) Weight change:  Last BM Date: 02/12/19  Intake/Output from previous day: 03/20 0701 - 03/21 0700 In: 940 [P.O.:940] Out: 700 [Urine:700]   Patient Vitals for the past 24 hrs:  BP Temp Temp src Pulse Resp SpO2  02/14/19 0834 - - - - - 99 %  02/14/19 0508 (!) 144/99 - - 78 - 100 %  02/14/19 0000 118/85 - - 73 - -  02/13/19 1942 (!) 156/112 98.8 F (37.1 C) Oral 82 17 100 %  02/13/19 1922 - - - 88 18 97 %  02/13/19 1735 - - - 94 20 98 %  02/13/19 1513 130/88 98 F (36.7 C) Oral 95 20 97 %  02/13/19 1402 - - - 97 20 98 %  02/13/19 1135 - - - 98 18 97 %      Physical Exam General: No apparent distress   HEENT: not dry;  c-collar in place; tracheostomy plugged Lungs: Normal effort. Lungs clear to auscultation, no crackles or wheezes. Cardiovascular: Regular rate and rhythm, no edema Abdomen: S/NT/ND; BS(+); midline surgical scar Musculoskeletal:  unchanged Neurological: No new neurological deficits Wounds: N/A    Skin: clear   Mental state: Alert, oriented, cooperative    Lab Results: BMET    Component Value Date/Time   NA 138 02/11/2019 0434   K 3.6 02/11/2019 0434   CL 101 02/11/2019 0434   CO2 25 02/11/2019 0434   GLUCOSE 97 02/11/2019 0434   BUN 16 02/11/2019 0434   CREATININE 0.81 02/11/2019 0434   CALCIUM 9.4 02/11/2019 0434   GFRNONAA >60 02/11/2019 0434   GFRAA >60  02/11/2019 0434   CBC    Component Value Date/Time   WBC 10.5 02/11/2019 0434   RBC 4.87 02/11/2019 0434   HGB 12.6 (L) 02/11/2019 0434   HCT 40.4 02/11/2019 0434   PLT 601 (H) 02/11/2019 0434   MCV 83.0 02/11/2019 0434   MCH 25.9 (L) 02/11/2019 0434   MCHC 31.2 02/11/2019 0434   RDW 17.2 (H) 02/11/2019 0434   LYMPHSABS 4.8 (H) 02/11/2019 0434   MONOABS 0.9 02/11/2019 0434   EOSABS 0.4 02/11/2019 0434   BASOSABS 0.1 02/11/2019 0434    Medications: I have reviewed the patient's current medications.  Assessment/Plan:  Functional deficits secondary to TBI/C7 fracture.  Continue cervical collar Status post polytrauma with splenic rupture and splenectomy DVT prophylaxis.  Continue Lovenox Pain management.  Continue oxycodone as needed Status post acute hypoxic respiratory failure.  Status post tracheostomy Hypertension.  Blood pressure still elevated.  We will continue to titrate medications   Length of stay, days: 4  Gordy Savers , MD 02/14/2019, 10:44 AM

## 2019-02-14 NOTE — Progress Notes (Signed)
Physical Therapy Session Note  Patient Details  Name: Hunter Cook MRN: [id number] Date of Birth: November 18, 1996  Today's Date: 02/14/2019   Short Term Goals: Week 1:  PT Short Term Goal 1 (Week 1): pt will perform sit>< stand consistently with min assist or less assistance PT Short Term Goal 2 (Week 1): pt will perform gait x 200' without AD, with min assist consistently PT Short Term Goal 3 (Week 1): pt will ascend/descend 2 steps without rails with LRAD, mod assist  Skilled Therapeutic Interventions/Progress Updates:    Pt off unit for RLE xray. PT will re-attempt at later time/date, provided pt is medically appropriate.   Therapy Documentation Precautions:  Precautions Precautions: Cervical Precaution Comments: trach Required Braces or Orthoses: Cervical Brace, Sling(sling for comfort) Cervical Brace: Hard collar, At all times Restrictions Weight Bearing Restrictions: Yes LUE Weight Bearing: Weight bearing as tolerated General: PT Amount of Missed Time (min): 30 Minutes PT Missed Treatment Reason: Xray Vital Signs: Therapy Vitals Temp: 98.2 F (36.8 C) Pulse Rate: 80 Resp: 18 BP: 133/84 Patient Position (if appropriate): Lying Oxygen Therapy SpO2: 99 % O2 Device: Room Air Pain: Pain Assessment Pain Scale: 0-10 Pain Score: 6  Pain Type: Acute pain Pain Location: Foot Pain Orientation: Anterior;Right Pain Descriptors / Indicators: Shooting Pain Intervention(s): RN made aware    Therapy/Group: Individual Therapy  Golden Pop 02/14/2019, 4:15 PM

## 2019-02-14 NOTE — Progress Notes (Signed)
Occupational Therapy Session Note  Patient Details  Name: Hunter Cook MRN: [id number] Date of Birth: 1996/08/08  Today's Date: 02/14/2019 OT Individual Time: 0800-0900 OT Individual Time Calculation (min): 60 min    Short Term Goals: Week 1:  OT Short Term Goal 1 (Week 1): Pt will don shirt using hemi technique with min A OT Short Term Goal 2 (Week 1): Pt will demonstrate intellectual awareness re deficits with min cueing OT Short Term Goal 3 (Week 1): Pt will don pants with min A OT Short Term Goal 4 (Week 1): Pt will increase L shoulder flex to 30 degrees in prep for functional ADL use   Skilled Therapeutic Interventions/Progress Updates:    1;1. Pt received in bed stating "I just want to go home." pt agreeable to shower. Pt completes ambulation iwht S and no overt LOB this date. Pt bathes sit to stand from TTB with supervision for washing peri area and buttocks. Pt requires VC for sitting to doff clothign for safety awareness. Pt dresses sit to stand from toilet with S for LB dressing and min VC for hemi technqiues with shirt. Pt unaware he donned shirt backwards on first attempt. OT changes pads on C collar and pt eats breakfast EOB. Pt self initaties using LUE to open/close containers in lap to use L grasp. Exited session with pt seated in bed, exit alarm on and call light in reach  Therapy Documentation Precautions:  Precautions Precautions: Cervical Precaution Comments: trach Required Braces or Orthoses: Cervical Brace, Sling(sling for comfort) Cervical Brace: Hard collar, At all times Restrictions Weight Bearing Restrictions: Yes LUE Weight Bearing: Weight bearing as tolerated General:    Therapy/Group: Individual Therapy  [name] 02/14/2019, 8:50 AM

## 2019-02-15 DIAGNOSIS — S069X0D Unspecified intracranial injury without loss of consciousness, subsequent encounter: Secondary | ICD-10-CM

## 2019-02-15 NOTE — Progress Notes (Signed)
Hunter Cook is a 23 y.o. male who is admitted for CIR with functional deficits secondary to TBI with polytrauma  Past Medical History:  Diagnosis Date  . Bipolar 1 disorder (HCC)   . Bipolar disorder (HCC)   . Snake bite      Subjective: No new complaints.  Asking about how long he must wear cervical collar.  Patient developed pain involving the right dorsal foot and ankle yesterday following a rehab session.  Pain is improved.  Radiographs reviewed and were unremarkable  Objective: Vital signs in last 24 hours: Temp:  [97.7 F (36.5 C)-98.2 F (36.8 C)] 98.1 F (36.7 C) (03/22 0502) Pulse Rate:  [70-94] 94 (03/22 0802) Resp:  [14-18] 14 (03/22 0502) BP: (131-136)/(84-94) 136/91 (03/22 0802) SpO2:  [98 %-100 %] 98 % (03/22 0502) Weight change:  Last BM Date: 02/14/19  Intake/Output from previous day: 03/21 0701 - 03/22 0700 In: 696 [P.O.:696] Out: 600 [Urine:600]  Patient Vitals for the past 24 hrs:  BP Temp Temp src Pulse Resp SpO2  02/15/19 0802 (!) 136/91 - - 94 - -  02/15/19 0502 (!) 133/93 98.1 F (36.7 C) Oral 70 14 98 %  02/14/19 2021 (!) 131/94 97.7 F (36.5 C) - 79 16 98 %  02/14/19 1559 - - - - - 99 %  02/14/19 1422 133/84 98.2 F (36.8 C) - 80 18 100 %  02/14/19 1421 133/84 - - 80 - -  02/14/19 1150 - - - - - 99 %    Physical Exam General: No apparent distress   HEENT: not dry; cervical collar in place.  Remote tracheostomy Lungs: Normal effort. Lungs clear to auscultation, no crackles or wheezes. Cardiovascular: Regular rate and rhythm, no edema Abdomen: S/NT/ND; BS(+) well-healed surgical midline scar Musculoskeletal: No swelling or inflammatory changes involving the right foot; flexion and extension of the toes reproduces pain Neurological: No new neurological deficits Skin: clear  Mental state: Alert, oriented, cooperative    Lab Results: BMET    Component Value Date/Time   NA 138 02/11/2019 0434   K 3.6 02/11/2019 0434   CL 101  02/11/2019 0434   CO2 25 02/11/2019 0434   GLUCOSE 97 02/11/2019 0434   BUN 16 02/11/2019 0434   CREATININE 0.81 02/11/2019 0434   CALCIUM 9.4 02/11/2019 0434   GFRNONAA >60 02/11/2019 0434   GFRAA >60 02/11/2019 0434   CBC    Component Value Date/Time   WBC 10.5 02/11/2019 0434   RBC 4.87 02/11/2019 0434   HGB 12.6 (L) 02/11/2019 0434   HCT 40.4 02/11/2019 0434   PLT 601 (H) 02/11/2019 0434   MCV 83.0 02/11/2019 0434   MCH 25.9 (L) 02/11/2019 0434   MCHC 31.2 02/11/2019 0434   RDW 17.2 (H) 02/11/2019 0434   LYMPHSABS 4.8 (H) 02/11/2019 0434   MONOABS 0.9 02/11/2019 0434   EOSABS 0.4 02/11/2019 0434   BASOSABS 0.1 02/11/2019 0434    Studies/Results: Dg Foot Complete Right  Result Date: 02/14/2019 CLINICAL DATA:  Pain aggravated by walking. EXAM: RIGHT FOOT COMPLETE - 3+ VIEW COMPARISON:  None. FINDINGS: There is no evidence of fracture or dislocation. There is no evidence of arthropathy or other focal bone abnormality. Soft tissues are unremarkable. IMPRESSION: Negative. Electronically Signed   By: Kennith Center M.D.   On: 02/14/2019 19:10    Medications: I have reviewed the patient's current medications.  Assessment/Plan:  Functional deficits secondary to TBI/C7 fracture.  Continue CIR with cervical collar Status post polytrauma with  splenic rupture and prior splenectomy DVT prophylaxis Probable mild overuse tendinitis right dorsal foot Hypertension.  Continue to monitor    Length of stay, days: 5  Gordy Savers , MD 02/15/2019, 9:49 AM

## 2019-02-16 ENCOUNTER — Inpatient Hospital Stay (HOSPITAL_COMMUNITY): Payer: Self-pay | Admitting: Speech Pathology

## 2019-02-16 ENCOUNTER — Inpatient Hospital Stay (HOSPITAL_COMMUNITY): Payer: Self-pay

## 2019-02-16 ENCOUNTER — Inpatient Hospital Stay (HOSPITAL_COMMUNITY): Payer: Self-pay | Admitting: Occupational Therapy

## 2019-02-16 ENCOUNTER — Inpatient Hospital Stay (HOSPITAL_COMMUNITY): Payer: Self-pay | Admitting: Physical Therapy

## 2019-02-16 MED ORDER — QUETIAPINE FUMARATE 25 MG PO TABS
25.0000 mg | ORAL_TABLET | Freq: Three times a day (TID) | ORAL | Status: DC
  Administered 2019-02-16 – 2019-02-19 (×9): 25 mg via ORAL
  Filled 2019-02-16 (×9): qty 1

## 2019-02-16 NOTE — Progress Notes (Addendum)
Physical Therapy Session Note  Patient Details  Name: Hunter Cook MRN: [id number] Date of Birth: Jul 27, 1996  Today's Date: 02/16/2019 PT Individual Time: 1115-5208 PT Individual Time Calculation (min): 54 min   Short Term Goals: Week 1:  PT Short Term Goal 1 (Week 1): pt will perform sit>< stand consistently with min assist or less assistance PT Short Term Goal 2 (Week 1): pt will perform gait x 200' without AD, with min assist consistently PT Short Term Goal 3 (Week 1): pt will ascend/descend 2 steps without rails with LRAD, mod assist  Skilled Therapeutic Interventions/Progress Updates:  Pt received in bed & agreeable to tx, reporting generalized pain with rest breaks provided PRN. Pt declines use of LUE sling & reports it's not time for his pain meds yet. Pt transfers supine>sit with supervision, ambulates throughout unit without AD & close supervision, negotiates 4 steps (6") without rails to ascend and with R rail to descend with min assist with cuing for compensatory pattern. Pt completed Berg Balance Test & scored [phone]; educated pt on interpretation of score & current fall risk. Patient demonstrates increased fall risk as noted by score of 38/56 on Berg Balance Scale.  (<36= high risk for falls, close to 100%; 37-45 significant >80%; 46-51 moderate >50%; 52-55 lower >25%). It should be noted that pt did not attempt to turn to look over shoulder to prevent pt from violating cervical precautions. Attempted kinetron in standing with RUE support with pt reporting need to sit 2/2 R ankle pain. Pt utilized Biodex Limits of Stability with RUE and no UE support with task focusing on weight shifting & balance. Retrograde gait x 30 ft x 2 with CGA with task focusing on dynamic balance & weight shifting with pt demonstrating slightly decreased weight shifting R. Pt engaged in dual task: walking and naming football teams with alternating letters of the alphabet with pt requiring seated rest breaks &  max cuing to recall next letter in alphabet. In room, pt used bathroom, standing to void & performing hand hygiene at sink with supervision & cuing to locate soap dispenser on wall. At end of session pt left in bed with alarm set & needs at hand.  Pt requires cuing to maintain cervical precautions during session & therapist tightens collar. Pt declines wearing LUE sling during session.   Educated pt on current injuries (pt with impaired intellectual awareness).  Therapy Documentation Precautions:  Precautions Precautions: Cervical Precaution Comments: trach Required Braces or Orthoses: Cervical Brace, Sling(sling for comfort) Cervical Brace: Hard collar, At all times Restrictions Weight Bearing Restrictions: Yes LUE Weight Bearing: Weight bearing as tolerated   Balance: Balance Balance Assessed: Yes Standardized Balance Assessment Standardized Balance Assessment: Berg Balance Test Berg Balance Test Sit to Stand: Able to stand without using hands and stabilize independently Standing Unsupported: Able to stand 2 minutes with supervision Sitting with Back Unsupported but Feet Supported on Floor or Stool: Able to sit safely and securely 2 minutes Stand to Sit: Sits safely with minimal use of hands Transfers: Able to transfer safely, minor use of hands Standing Unsupported with Eyes Closed: Able to stand 10 seconds with supervision Standing Ubsupported with Feet Together: Able to place feet together independently and stand for 1 minute with supervision From Standing, Reach Forward with Outstretched Arm: Can reach forward >12 cm safely (5") From Standing Position, Pick up Object from Floor: Able to pick up shoe, needs supervision From Standing Position, Turn to Look Behind Over each Shoulder: Needs assist to keep  from losing balance and falling(did not attempt 2/2 neck precautions) Turn 360 Degrees: Needs close supervision or verbal cueing Standing Unsupported, Alternately Place Feet on  Step/Stool: Able to complete 4 steps without aid or supervision(completes 8 steps with supervision) Standing Unsupported, One Foot in Front: Able to plae foot ahead of the other independently and hold 30 seconds Standing on One Leg: Tries to lift leg/unable to hold 3 seconds but remains standing independently Total Score: 38    Therapy/Group: Individual Therapy  Sandi Mariscal 02/16/2019, 3:37 PM

## 2019-02-16 NOTE — Plan of Care (Signed)
  Problem: Consults Goal: RH BRAIN INJURY PATIENT EDUCATION Description Description: See Patient Education module for eduction specifics Outcome: Progressing   Problem: RH SAFETY Goal: RH STG ADHERE TO SAFETY PRECAUTIONS W/ASSISTANCE/DEVICE Description STG Adhere to Safety Precautions With Min Assistance/Device.  Outcome: Progressing   Problem: RH COGNITION-NURSING Goal: RH STG ANTICIPATES NEEDS/CALLS FOR ASSIST W/ASSIST/CUES Description STG Anticipates Needs/Calls for Assist With Min Assistance/Cues.  Outcome: Progressing   Problem: RH PAIN MANAGEMENT Goal: RH STG PAIN MANAGED AT OR BELOW PT'S PAIN GOAL Description < 3 out of 10.   Outcome: Not Progressing

## 2019-02-16 NOTE — Progress Notes (Signed)
Patient is Focused on Discharge. Irritable and Agitated. Patient reported he does not understand why he's unable to go home. Patient stated, "unlike these other people in here I can walk and take care of myself". Patient stated, "I'm about to lose my sh*t". Active listening, emotional support and reassurance provided.

## 2019-02-16 NOTE — Progress Notes (Signed)
Occupational Therapy Session Note  Patient Details  Name: Hunter Cook MRN: [id number] Date of Birth: 1996/08/02  Today's Date: 02/16/2019 OT Individual Time: 1135-1200 OT Individual Time Calculation (min): 25 min    Short Term Goals: Week 1:  OT Short Term Goal 1 (Week 1): Pt will don shirt using hemi technique with min A OT Short Term Goal 2 (Week 1): Pt will demonstrate intellectual awareness re deficits with min cueing OT Short Term Goal 3 (Week 1): Pt will don pants with min A OT Short Term Goal 4 (Week 1): Pt will increase L shoulder flex to 30 degrees in prep for functional ADL use       Skilled Therapeutic Interventions/Progress Updates:    Pt received sitting at EOB working on his computer. He was agreeable to walking to laundry room to transfer clothing from washer to dryer.  Pt c/o pain in B arms trying to reach down into washer.  Pt eventually was able to use his R arm to retrieve clothing and place in dryer with no cuing and set dryer with no cues.  He ambulated to gym to work on LUE PROM but pt said gym was too cold and during exercises he found the slightest bit of gentle mobs to his arm very painful. Pt got frustrated bc he needed pain meds but did recall without cues that he was due for them at noon.  Pt returned to his room and began to get agitated that he couldn't go home. Pt said he was ready to leave today.  Bed alarm set and all needs met.   Therapy Documentation Precautions:  Precautions Precautions: Cervical Precaution Comments: trach Required Braces or Orthoses: Cervical Brace, Sling(sling for comfort) Cervical Brace: Hard collar, At all times Restrictions Weight Bearing Restrictions: Yes LUE Weight Bearing: Weight bearing as tolerated   Pain: Pain Assessment Pain Scale: 0-10 Pain Score: 10-Worst pain ever Pain Type: Acute pain Pain Location: Arm Pain Orientation: Left;Right Pain Descriptors / Indicators: Aching Pain Frequency: Constant Pain  Onset: On-going Pain Intervention(s): Medication (See eMAR)    Therapy/Group: Individual Therapy  [place] 02/16/2019, 12:22 PM

## 2019-02-16 NOTE — Progress Notes (Signed)
Speech Language Pathology Daily Session Note  Patient Details  Name: Hunter Cook MRN: [id number] Date of Birth: Mar 24, 1996  Today's Date: 02/16/2019 SLP Individual Time: 9794-8016 SLP Individual Time Calculation (min): 45 min  Short Term Goals: Week 1: SLP Short Term Goal 1 (Week 1): Patient will consume current diet without overt s/s of aspiration and Mod I for use of swallowing compensatory strategies.  SLP Short Term Goal 2 (Week 1): Patient will demonstrate functional problem solving for mildly complex tasks with Min A verbal cues.  SLP Short Term Goal 3 (Week 1): Patient will recall new, daily information with Min A verbal and visual cues.  SLP Short Term Goal 4 (Week 1): Patient will demonstrate selective attention to functional tasks ~30 minutes in a mildly distracting enviornment with Min A verbal cues for redirection.  SLP Short Term Goal 5 (Week 1): Patient will self-monitor and correct errors during functional tasks with Min A verbal cues.   Skilled Therapeutic Interventions: Skilled treatment session focused on cognitive goals. Upon arrival, patient was upright consuming his breakfast meal of regular textures with thin liquids and consumed meal without overt s/s of aspiration with overall Mod I for safety. Therefore, recommend patient need only intermittent supervision with meals. SLP facilitated session by providing Min-Mod A verbal cues for recall of events from previous therapy sessions, therefore, SLP initiated a memory notebook in which patient utilized with overall Min A verbal cues. Patient continues to demonstrate intermittent awareness of deficits and requests to go home but is easily redirected. Patient left upright in bed with alarm on and all needs within reach. Continue with current plan of care.      Pain Pain Assessment Pain Scale: 0-10 Pain Score: 10-Worst pain ever Pain Type: Acute pain Pain Location: Arm Pain Orientation: Left;Right Pain Descriptors /  Indicators: Aching Pain Frequency: Constant Pain Onset: On-going Pain Intervention(s): Medication (See eMAR)  Therapy/Group: Individual Therapy  Hunter Cook 02/16/2019, 12:54 PM

## 2019-02-16 NOTE — Progress Notes (Addendum)
Occupational Therapy Session Note  Patient Details  Name: Hunter Cook MRN: [id number] Date of Birth: February 21, 1996  Today's Date: 02/16/2019 OT Individual Time: 0930-1030 OT Individual Time Calculation (min): 60 min    Short Term Goals: Week 1:  OT Short Term Goal 1 (Week 1): Pt will don shirt using hemi technique with min A OT Short Term Goal 2 (Week 1): Pt will demonstrate intellectual awareness re deficits with min cueing OT Short Term Goal 3 (Week 1): Pt will don pants with min A OT Short Term Goal 4 (Week 1): Pt will increase L shoulder flex to 30 degrees in prep for functional ADL use   Skilled Therapeutic Interventions/Progress Updates:    Pt received supine with c/o pain in B arms, esp R this session. RN aware and attempted to provide shower and PROM for pain relief. Pt completed functional mobility around room to retrieve clothes for shower with (S)- CGA. Pt doffed all clothing with CGA sit <> stand. He completed all bathing with use of grab bars anteriorly with (S). Cueing provided to don shorts seated for fall prevention. Pt still demonstrating deficits in awareness of deficits and safety overall. C collar pads changed as pt sat EOB. Pt required cueing for hemi technique to don shirt but then was able to do so with no physical A. Redirection to task several times during session and condition insight. Pt completed 250 ft of functional mobility at CGA level. Attempted to have pt perform closed chain B UE AROM in supine but pt limited severely by pain. Pt completed obstacle course with a focus on challenging dynamic standing balance with CGA throughout. Pt returned to room and was cued to complete memory notebook entry. Pt became increasingly frustrated/perseverating on d/c and then asked for a "noose to hang himself". When questioned if pt was thinking seriously about hurting himself he stated "I just need to get out of here". Pt left sitting EOB with preferred music playing bed alarm set.  RN aware of pt comments.   Therapy Documentation Precautions:  Precautions Precautions: Cervical Precaution Comments: trach Required Braces or Orthoses: Cervical Brace, Sling(sling for comfort) Cervical Brace: Hard collar, At all times Restrictions Weight Bearing Restrictions: Yes LUE Weight Bearing: Weight bearing as tolerated Pain: Pain Assessment Pain Scale: 0-10 Pain Score: 7  Pain Type: Acute pain Pain Location: Arm Pain Orientation: Left Pain Descriptors / Indicators: Aching Pain Frequency: Constant Pain Onset: On-going Pain Intervention(s): Repositioned;Emotional support   Therapy/Group: Individual Therapy  Crissie Reese 02/16/2019, 10:59 AM

## 2019-02-16 NOTE — Care Management (Signed)
[facility] Individual Statement of Services  Patient Name:  Hunter Cook  Date:  02/16/2019  Welcome to the [facility].  Our goal is to provide you with an individualized program based on your diagnosis and situation, designed to meet your specific needs.  With this comprehensive rehabilitation program, you will be expected to participate in at least 3 hours of rehabilitation therapies Monday-Friday, with modified therapy programming on the weekends.  Your rehabilitation program will include the following services:  Physical Therapy (PT), Occupational Therapy (OT), Speech Therapy (ST), 24 hour per day rehabilitation nursing, Therapeutic Recreaction (TR), Neuropsychology, Case Management (Social Worker), Rehabilitation Medicine, Nutrition Services and Pharmacy Services  Weekly team conferences will be held on Tuesdays to discuss your progress.  Your Social Worker will talk with you frequently to get your input and to update you on team discussions.  Team conferences with you and your family in attendance may also be held.  Expected length of stay: 14 days   Overall anticipated outcome: supervision  Depending on your progress and recovery, your program may change. Your Social Worker will coordinate services and will keep you informed of any changes. Your Social Worker's name and contact numbers are listed  below.  The following services may also be recommended but are not provided by the [facility]:   Driving Evaluations  Home Health Rehabiltiation Services  Outpatient Rehabilitation Services  Vocational Rehabilitation   Arrangements will be made to provide these services after discharge if needed.  Arrangements include referral to agencies that provide these services.  Your insurance has been verified to be:  None [employer] counseling dept following) Your primary doctor is:  None  Pertinent information will be shared  with your doctor and your insurance company.  Social Worker:  [place], [place] [phone] or ([phone]   Information discussed with and copy given to patient by: Amada Jupiter, 02/16/2019, 4:22 PM
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
## 2019-02-17 ENCOUNTER — Inpatient Hospital Stay (HOSPITAL_COMMUNITY): Payer: Self-pay

## 2019-02-17 ENCOUNTER — Encounter (HOSPITAL_COMMUNITY): Payer: Self-pay | Admitting: Psychology

## 2019-02-17 ENCOUNTER — Inpatient Hospital Stay (HOSPITAL_COMMUNITY): Payer: Self-pay | Admitting: Speech Pathology

## 2019-02-17 DIAGNOSIS — R52 Pain, unspecified: Secondary | ICD-10-CM

## 2019-02-17 NOTE — Progress Notes (Signed)
Occupational Therapy Session Note  Patient Details  Name: Hunter Cook MRN: [id number] Date of Birth: 08-24-1996  Today's Date: 02/17/2019 OT Individual Time: 3154-0086 OT Individual Time Calculation (min): 55 min    Short Term Goals: Week 1:  OT Short Term Goal 1 (Week 1): Pt will don shirt using hemi technique with min A OT Short Term Goal 2 (Week 1): Pt will demonstrate intellectual awareness re deficits with min cueing OT Short Term Goal 3 (Week 1): Pt will don pants with min A OT Short Term Goal 4 (Week 1): Pt will increase L shoulder flex to 30 degrees in prep for functional ADL use   Skilled Therapeutic Interventions/Progress Updates:    Session focused on bathing and dressing at shower level. Pt completed functional mobility into bathroom with (S). Pt doffed clothing with (S), cueing to sit down for safety. Pt completed all bathing with (S) using TTB. Min A provided to assist pt in shaving head using electric razor. Pt was edu in changing pads on cervical collar. Reinforced importance of wearing collar with pt. Pt donned shirt with min A d/t pain in L shoulder. Pt completed LB dressing with (S) from sit <> stand level. Pt completed 200+ ft of functional mobility at (S) level. Pt completed tub transfer without AD with 1 LOB requiring mod A to correct. Pt edu on use of suction cup grab bars and on recommendation for TTB. Pt returned to room and was left sitting up in recliner with chair alarm belt fastened and all needs met.   Therapy Documentation Precautions:  Precautions Precautions: Cervical Precaution Comments: trach Required Braces or Orthoses: Cervical Brace, Sling(sling for comfort) Cervical Brace: Hard collar, At all times Restrictions Weight Bearing Restrictions: Yes LUE Weight Bearing: Weight bearing as tolerated Pain: Pain Assessment Pain Scale: 0-10 Pain Score: 7  Pain Type: Acute pain Pain Location: Arm Pain Orientation: Left Pain Descriptors / Indicators:  Aching Pain Frequency: Constant Pain Onset: On-going Patients Stated Pain Goal: 2 Pain Intervention(s): Medication (See eMAR)   Therapy/Group: Individual Therapy  Curtis Sites 02/17/2019, 7:15 AM
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
## 2019-02-17 NOTE — Plan of Care (Signed)
  Problem: Consults Goal: RH BRAIN INJURY PATIENT EDUCATION Description Description: See Patient Education module for eduction specifics Outcome: Progressing   Problem: RH SKIN INTEGRITY Goal: RH STG SKIN FREE OF INFECTION/BREAKDOWN Description No new breakdown with min assist   Outcome: Progressing Goal: RH STG ABLE TO PERFORM INCISION/WOUND CARE W/ASSISTANCE Description STG Able To Perform Incision/Wound Care With Min Assistance.  Outcome: Progressing   Problem: RH SAFETY Goal: RH STG ADHERE TO SAFETY PRECAUTIONS W/ASSISTANCE/DEVICE Description STG Adhere to Safety Precautions With Min Assistance/Device.  Outcome: Progressing Goal: RH STG DECREASED RISK OF FALL WITH ASSISTANCE Description STG Decreased Risk of Fall With Min Assistance.  Outcome: Progressing   Problem: RH COGNITION-NURSING Goal: RH STG USES MEMORY AIDS/STRATEGIES W/ASSIST TO PROBLEM SOLVE Description STG Uses Memory Aids/Strategies With Min Assistance to Problem Solve.  Outcome: Progressing Goal: RH STG ANTICIPATES NEEDS/CALLS FOR ASSIST W/ASSIST/CUES Description STG Anticipates Needs/Calls for Assist With Min Assistance/Cues.  Outcome: Progressing   Problem: RH PAIN MANAGEMENT Goal: RH STG PAIN MANAGED AT OR BELOW PT'S PAIN GOAL Description < 3 out of 10.   Outcome: Progressing   
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
## 2019-02-17 NOTE — Progress Notes (Signed)
Speech Language Pathology Daily Session Note  Patient Details  Name: Hunter Cook MRN: [id number] Date of Birth: 06-07-1996  Today's Date: 02/17/2019 SLP Individual Time: 5631-4970 SLP Individual Time Calculation (min): 55 min  Short Term Goals: Week 1: SLP Short Term Goal 1 (Week 1): Patient will consume current diet without overt s/s of aspiration and Mod I for use of swallowing compensatory strategies.  SLP Short Term Goal 2 (Week 1): Patient will demonstrate functional problem solving for mildly complex tasks with Min A verbal cues.  SLP Short Term Goal 3 (Week 1): Patient will recall new, daily information with Min A verbal and visual cues.  SLP Short Term Goal 4 (Week 1): Patient will demonstrate selective attention to functional tasks ~30 minutes in a mildly distracting enviornment with Min A verbal cues for redirection.  SLP Short Term Goal 5 (Week 1): Patient will self-monitor and correct errors during functional tasks with Min A verbal cues.   Skilled Therapeutic Interventions: Skilled treatment session focused on cognitive goals. SLP facilitated session by providing supervision verbal cues for problem solving and error awareness while organizing a QD pill box. Patient also participated in a functional conversation that focused on anticipatory awareness and discharge planning and required overall Mod A verbal cues to verbalize safe decision making. Patient left upright in bed with alarm on and all needs within reach. Continue with current plan of care.      Pain No/Denies Pain   Therapy/Group: Individual Therapy  [name] 02/17/2019, 2:51 PM

## 2019-02-18 ENCOUNTER — Inpatient Hospital Stay (HOSPITAL_COMMUNITY): Payer: Self-pay

## 2019-02-18 ENCOUNTER — Inpatient Hospital Stay (HOSPITAL_COMMUNITY): Payer: Self-pay | Admitting: Physical Therapy

## 2019-02-18 ENCOUNTER — Inpatient Hospital Stay (HOSPITAL_COMMUNITY): Payer: Self-pay | Admitting: Speech Pathology

## 2019-02-18 DIAGNOSIS — R52 Pain, unspecified: Secondary | ICD-10-CM

## 2019-02-18 DIAGNOSIS — S069X3S Unspecified intracranial injury with loss of consciousness of 1 hour to 5 hours 59 minutes, sequela: Secondary | ICD-10-CM

## 2019-02-18 MED ORDER — NICOTINE 14 MG/24HR TD PT24
14.0000 mg | MEDICATED_PATCH | Freq: Every day | TRANSDERMAL | Status: DC
  Administered 2019-02-18 – 2019-02-20 (×3): 14 mg via TRANSDERMAL
  Filled 2019-02-18 (×3): qty 1

## 2019-02-18 NOTE — Progress Notes (Signed)
Speech Language Pathology Daily Session Note  Patient Details  Name: Jamale Petrak MRN: [id number] Date of Birth: [birth year]  Today's Date: 02/18/2019 SLP Individual Time: 0900-1000 SLP Individual Time Calculation (min): 60 min  Short Term Goals: Week 1: SLP Short Term Goal 1 (Week 1): Patient will consume current diet without overt s/s of aspiration and Mod I for use of swallowing compensatory strategies.  SLP Short Term Goal 2 (Week 1): Patient will demonstrate functional problem solving for mildly complex tasks with Min A verbal cues.  SLP Short Term Goal 3 (Week 1): Patient will recall new, daily information with Min A verbal and visual cues.  SLP Short Term Goal 4 (Week 1): Patient will demonstrate selective attention to functional tasks ~30 minutes in a mildly distracting enviornment with Min A verbal cues for redirection.  SLP Short Term Goal 5 (Week 1): Patient will self-monitor and correct errors during functional tasks with Min A verbal cues.   Skilled Therapeutic Interventions: Skilled treatment session focused on cognitive goals. SLP facilitated session by administetring the Cognistat. Patient demonstrated mild impairments in attention, calculations and reasoning and moderate impairments in memory. Although patient continues to demonstrate these deficits on the standardized assessment, patient demonstrates increased ability to compensate for memory deficits with external aids with increased carryover of daily and functional information.  Patient also verbalized increased reasoning in regards to generating a list of appropriate activities he can participate in at home. Patient left upright in recliner with alarm on and all needs within reach. Continue with current plan of care.      Pain No/Denies Pain   Therapy/Group: Individual Therapy  Chandni Gagan 02/18/2019, 1:24 PM

## 2019-02-18 NOTE — Progress Notes (Signed)
Social Work Patient ID: Hunter Cook, male   DOB: 10/27/1996, 23 y.o.   MRN: [id number]  Have reviewed team conference with pt and his father. Both aware and agreeable with targeted d/c date of 3/27.  Discussed recommendation of HH therapies, however, father states, "That's not going to work.  We have a dog. He will eat them."  Father declines HH f/u for this reason.  Will alert tx team to this.  Also, addressed questions about medication cost and explained that I am placing pt into the [facility] med assist program and will likely refer pt to [facility] and [facility].  Continue to follow.  Corban Kistler, LCSW

## 2019-02-18 NOTE — Progress Notes (Signed)
Physical Therapy Session Note  Patient Details  Name: Hunter Cook MRN: [id number] Date of Birth: March 07, 1996  Today's Date: 02/18/2019 PT Individual Time: 1335-1444 PT Individual Time Calculation (min): 69 min   Short Term Goals: Week 1:  PT Short Term Goal 1 (Week 1): pt will perform sit>< stand consistently with min assist or less assistance PT Short Term Goal 2 (Week 1): pt will perform gait x 200' without AD, with min assist consistently PT Short Term Goal 3 (Week 1): pt will ascend/descend 2 steps without rails with LRAD, mod assist  Skilled Therapeutic Interventions/Progress Updates:  Pt received in bed & agreeable to tx. Pt reports pain in RUE & R ankle but states it's not time for his pain meds yet & rest breaks provided PRN. Pt dons LUE sling without assistance. Pt ambulates around unit without AD & supervision with decreased weight shifting to R. Pt negotiates 24 steps (6" + 3") with 1 rail and supervision. Pt participated in scavenger hunt, locating 3/3 items on unit with occasional cuing & recalling 3/3 items without assistance. Utilized items from [employer] & had pt engaged in shopping task & calculating totals in his head with pt becoming disengaged when task becomes challenging. Pt engaged in playing scattegories with task focusing on cognition with pt reporting frustration when having difficulty with task. Pt completed normal & cognitive TUG (see scores below). Educated on need to wear collar even after discharging with pt reporting he only plans to take it off to shower. Used Wii Fit balance activities to focus on balance & weight shifting. Pt utilized nu-step on level 6 x 2 1/2 minutes with BLE only for BLE strengthening; pt refused further participation in activity as he did not want to become sweaty despite encouragement from therapist. At end of session pt left in bed with alarm set & needs at hand.  Therapy Documentation Precautions:  Precautions Precautions:  Cervical Precaution Comments: trach Required Braces or Orthoses: Cervical Brace, Sling(LUE sling for comfort) Cervical Brace: Hard collar, At all times Restrictions Weight Bearing Restrictions: Yes LUE Weight Bearing: Weight bearing as tolerated  Balance: Balance Balance Assessed: Yes Standardized Balance Assessment Standardized Balance Assessment: Timed Up and Go Test Timed Up and Go Test TUG: Normal TUG;Cognitive TUG Normal TUG (seconds): 9.37(average of 3 trials (10.05 sec, 8.76 sec, 9.29 sec)) Cognitive TUG (seconds): 11.15(average of 3 trials (12.43 sec, 10.33 sec, 10.68 sec) counting backwards from 100 by 3)    Therapy/Group: Individual Therapy  Sandi Mariscal 02/18/2019, 2:45 PM

## 2019-02-18 NOTE — Progress Notes (Signed)
Physical Therapy Session Note  Patient Details  Name: Hunter Cook MRN: [id number] Date of Birth: 07/20/1996  Today's Date: 02/18/2019 PT Individual Time: 1200-1228 PT Individual Time Calculation (min): 28 min   Short Term Goals: Week 1:  PT Short Term Goal 1 (Week 1): pt will perform sit>< stand consistently with min assist or less assistance PT Short Term Goal 2 (Week 1): pt will perform gait x 200' without AD, with min assist consistently PT Short Term Goal 3 (Week 1): pt will ascend/descend 2 steps without rails with LRAD, mod assist  Skilled Therapeutic Interventions/Progress Updates:   Pt received supine in bed and agreeable to PT. Supine>sit transfer with supervisoin assist and min cues for no use of LUE. Gait training through hall, supervision assist from PT while engaged in conversation, one near [place], but able to correct. Attempted to have pt dribble basketball in hall, but reports R forearm pain with forceful movement. Dynamic balance training on Kinetron; reciprocal step training 3 x 15 BLE. Pt returned to room and performed  transfer to bed with supervision assist. Sit>supine completed without assist, and left supine in bed with call bell in reach and all needs met.       Therapy Documentation Precautions:  Precautions Precautions: Cervical Precaution Comments: trach Required Braces or Orthoses: Cervical Brace, Sling(LUE sling for comfort) Cervical Brace: Hard collar, At all times Restrictions Weight Bearing Restrictions: Yes LUE Weight Bearing: Weight bearing as tolerated Pain: 5/10. L shoulder, see MAR    Therapy/Group: Individual Therapy  Lorie Phenix 02/18/2019, 1:32 PM

## 2019-02-18 NOTE — Progress Notes (Signed)
Physical Therapy Discharge Summary  Patient Details  Name: Hunter Cook MRN: [id number] Date of Birth: 1996/05/15  Today's Date: 02/19/2019    Patient has met 10 of 10 long term goals due to improved activity tolerance, improved balance, improved postural control, increased strength, increased range of motion, ability to compensate for deficits, improved attention, improved awareness and improved coordination.  Patient to discharge at an ambulatory level supervision without AD.   No in person family ed completed.   Reasons goals not met: n/a goals met with pt achieving S level for all mobility.  Educated in fall recovery and when appropriate to perform versus calling 911.    Recommendation:  Patient will benefit from ongoing skilled PT services in home health setting to continue to advance safe functional mobility, address ongoing impairments in balance, awareness, memory, stair negotiation, and minimize fall risk.  Equipment: No equipment provided  Reasons for discharge: treatment goals met and discharge from hospital  Patient/family agrees with progress made and goals achieved: Yes  PT Discharge Precautions/Restrictions Precautions Precautions: Cervical Required Braces or Orthoses: Cervical Brace;Sling(LUE sling for comfort) Cervical Brace: Hard collar;At all times Restrictions Weight Bearing Restrictions: Yes LUE Weight Bearing: Weight bearing as tolerated  Vision/Perception      Cognition Overall Cognitive Status: Impaired/Different from baseline Arousal/Alertness: Awake/alert Orientation Level: Oriented X4  Sensation Sensation Light Touch: Appears Intact Coordination Gross Motor Movements are Fluid and Coordinated: Yes Fine Motor Movements are Fluid and Coordinated: Yes  Motor  Motor Motor: Abnormal postural alignment and control   Mobility Bed Mobility Bed Mobility: Rolling Right;Rolling Left;Sit to Supine;Supine to Sit Rolling Right:  Independent Rolling Left: Independent Supine to Sit: Independent Sit to Supine: Independent Transfers Transfers: Sit to Stand;Stand to Sit Sit to Stand: Independent Stand to Sit: Independent  Locomotion  Gait Ambulation: Yes Gait Assistance: Supervision/Verbal cueing Gait Distance (Feet): 150 Feet Assistive device: None Gait Gait: Yes Gait Pattern: (slightly decreased weight shifting R) Stairs / Additional Locomotion Stairs: Yes Stairs Assistance: Supervision/Verbal cueing Stair Management Technique: (1 rail) Number of Stairs: 24 Height of Stairs: (6" + 3") Wheelchair Mobility Wheelchair Mobility: No   Trunk/Postural Assessment  Cervical Assessment Cervical Assessment: Exceptions to WFL(cervical precautions) Thoracic Assessment Thoracic Assessment: Within Functional Limits Lumbar Assessment Lumbar Assessment: Within Functional Limits Postural Control Postural Control: Within Functional Limits   Balance Balance Balance Assessed: Yes Standardized Balance Assessment Standardized Balance Assessment: Timed Up and Go Test Timed Up and Go Test TUG: Normal TUG;Cognitive TUG Normal TUG (seconds): 9.37(average of 3 trials (10.05 sec, 8.76 sec, 9.29 sec)) Cognitive TUG (seconds): 11.15(average of 3 trials (12.43 sec, 10.33 sec, 10.68 sec) counting backwards from 100 by 3)  Berg Balance Test = 38/56 on 02/16/2019 Completed Berg Balance Test 02/19/2019 = 49/56.  Extremity Assessment  BLE WFL.  [name], PT, DPt 02/18/2019, 1:15 PM  [name] 02/19/2019, 6:23 PM
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
## 2019-02-18 NOTE — Plan of Care (Signed)
  Problem: Consults Goal: RH BRAIN INJURY PATIENT EDUCATION Description Description: See Patient Education module for eduction specifics Outcome: Progressing   Problem: RH SKIN INTEGRITY Goal: RH STG SKIN FREE OF INFECTION/BREAKDOWN Description No new breakdown with min assist   Outcome: Progressing Goal: RH STG ABLE TO PERFORM INCISION/WOUND CARE W/ASSISTANCE Description STG Able To Perform Incision/Wound Care With [employer].  Outcome: Progressing   Problem: RH SAFETY Goal: RH STG ADHERE TO SAFETY PRECAUTIONS W/ASSISTANCE/DEVICE Description STG Adhere to Safety Precautions With Min Assistance/Device.  Outcome: Progressing Goal: RH STG DECREASED RISK OF FALL WITH ASSISTANCE Description STG Decreased Risk of Fall With [employer].  Outcome: Progressing   Problem: RH COGNITION-NURSING Goal: RH STG USES MEMORY AIDS/STRATEGIES W/ASSIST TO PROBLEM SOLVE Description STG Uses Memory Aids/Strategies With Min Assistance to Problem Solve.  Outcome: Progressing Goal: RH STG ANTICIPATES NEEDS/CALLS FOR ASSIST W/ASSIST/CUES Description STG Anticipates Needs/Calls for Assist With Min Assistance/Cues.  Outcome: Progressing   Problem: RH PAIN MANAGEMENT Goal: RH STG PAIN MANAGED AT OR BELOW PT'S PAIN GOAL Description < 3 out of 10.   Outcome: Progressing

## 2019-02-18 NOTE — Progress Notes (Signed)
Occupational Therapy Session Note  Patient Details  Name: Hunter Cook MRN: [id number] Date of Birth: 06-Jun-1996  Today's Date: 02/18/2019 OT Individual Time: 8184-0375 OT Individual Time Calculation (min): 69 min    Short Term Goals: Week 1:  OT Short Term Goal 1 (Week 1): Pt will don shirt using hemi technique with min A OT Short Term Goal 2 (Week 1): Pt will demonstrate intellectual awareness re deficits with min cueing OT Short Term Goal 3 (Week 1): Pt will don pants with min A OT Short Term Goal 4 (Week 1): Pt will increase L shoulder flex to 30 degrees in prep for functional ADL use   Skilled Therapeutic Interventions/Progress Updates:    Pt received supine, angry about breakfast not being delivered yet. Pt was given breakfast tray and as he ate, intermittent (S) provided and edu re d/c planning. Reinforced need for pt to wear cervical collar upon d/c- pt likely will not. Pt brushed teeth at sink in standing with (S). Toileting completed in standing with (S). Pt completed 200 ft of functional mobility to therapy gym with (S) for balance. Dynomometer grip strength completed- R 60# and L 5 #. Pt has had progressive pain in B UE and this has likely affected grip strength overall. Pt completed gravity eliminated shoulder and elbow AAROM. Pt edu re importance of L UE mobilization to promote maximal functional return. Pt returned to room and left sitting up in recliner with all needs met.   Therapy Documentation Precautions:  Precautions Precautions: Cervical Precaution Comments: trach Required Braces or Orthoses: Cervical Brace, Sling(sling for comfort) Cervical Brace: Hard collar, At all times Restrictions Weight Bearing Restrictions: No LUE Weight Bearing: Weight bearing as tolerated Pain: Pain Assessment Pain Scale: 0-10 Pain Score: 10-Worst pain ever Pain Type: Acute pain Pain Location: Shoulder Pain Orientation: Left Pain Descriptors / Indicators: Aching Pain Frequency:  Constant Pain Onset: On-going Pain Intervention(s): Cold applied   Therapy/Group: Individual Therapy  Curtis Sites 02/18/2019, 8:09 AM

## 2019-02-19 ENCOUNTER — Inpatient Hospital Stay (HOSPITAL_COMMUNITY): Payer: Self-pay | Admitting: Speech Pathology

## 2019-02-19 ENCOUNTER — Inpatient Hospital Stay (HOSPITAL_COMMUNITY): Payer: Self-pay

## 2019-02-19 MED ORDER — OXYCODONE HCL 5 MG PO TABS
10.0000 mg | ORAL_TABLET | Freq: Four times a day (QID) | ORAL | Status: DC
  Administered 2019-02-19 – 2019-02-20 (×5): 10 mg via ORAL
  Filled 2019-02-19 (×5): qty 2

## 2019-02-19 MED ORDER — CLONAZEPAM 0.25 MG PO TBDP
0.2500 mg | ORAL_TABLET | Freq: Two times a day (BID) | ORAL | Status: DC
  Administered 2019-02-19 – 2019-02-20 (×2): 0.25 mg via ORAL
  Filled 2019-02-19 (×2): qty 1

## 2019-02-19 MED ORDER — QUETIAPINE FUMARATE 25 MG PO TABS: 25.0000 mg | ORAL_TABLET | Freq: Every day | ORAL | Status: DC

## 2019-02-19 NOTE — Progress Notes (Signed)
Speech Language Pathology Discharge Summary  Patient Details  Name: Hunter Cook MRN: [id number] Date of Birth: [birth year]  Today's Date: 02/19/2019 SLP Individual Time: 0830-0930 SLP Individual Time Calculation (min): 60 min   Skilled Therapeutic Interventions: Skilled treatment session focused on cognitive goals. SLP facilitated session by providing education to the patient and his dad (via the telephone) in regards to patient's current cognitive functioning and strategies to utilize at home to maximize safety including the importance of 24 hour supervision, use of memory compensatory strategies and a list of activities the patient can perform safely at home.  Handouts were also given to reinforce information. Both verbalized understanding of all information. Patient left upright in bed with alarm on.   Patient has met 5 of 5 long term goals.  Patient to discharge at overall Supervision level.   Reasons goals not met: N/A   Clinical Impression/Discharge Summary: Patient has made excellent gains and has met 5 of 5 LTGs this admission. Currently, patient demonstrates behaviors consistent with a Rancho Level VIII and requires overall supervision level verbal and visual cues for selective attention, complex problem solving, and recall of functional information. Patient demonstrates improved overall emergent awareness but continues to require overall Min-Mod A multimodal cues for anticipatory awareness. Patient is also consuming regular textures with thin liquids with Mod I without overt s/s of aspiration. Patient and family education is complete and patient will discharge home with 24 hour supervision from family. Patient would benefit from f/u SLP services to maximize his overall cognitive functioning and functional independence in order to reduce caregiver burden.   Care Partner:  Caregiver Able to Provide Assistance: Yes  Type of Caregiver Assistance: Physical;Cognitive  Recommendation:  24  hour supervision/assistance;Home Health SLP  Rationale for SLP Follow Up: Maximize cognitive function and independence;Reduce caregiver burden   Equipment: N/A   Reasons for discharge: Treatment goals met;Discharged from hospital   Patient/Family Agrees with Progress Made and Goals Achieved: Yes    [place], [place] 02/19/2019, 6:37 AM

## 2019-02-19 NOTE — Progress Notes (Signed)
Occupational Therapy Discharge Summary  Patient Details  Name: Hunter Cook MRN: [id number] Date of Birth: 01/27/1996  Today's Date: 02/19/2019 OT Individual Time: 1015-1130 OT Individual Time Calculation (min): 75 min   Tx: Pt received in bed. Pt agreeable to washing dressing and grooming. Pt completes bathing, dressing and grooming at set up ambulatory level. OT only provides A for changing collar pads after shower and question cues for sitting to thread BLE into pants. Pt gathers clothing items in room to place into suitcase. Pt completes toileting with set up. Pt educated and given a handout for self ROM. Pt completes 1x10 of each exercise in HEP. Also demonstrated pendulum exercises to increase shoulder mobility and ROM in L shoulder. Exited session with pt seated in bed, call light in reach and exit alarm on.   Patient has met 11 of 11 long term goals due to improved activity tolerance, improved balance, postural control, ability to compensate for deficits, functional use of  LEFT upper extremity, improved attention, improved awareness and improved coordination.  Patient to discharge at overall Supervision level.  Pt continues to demo decreased ROM in RUE and decreased balance impacting safety and independnce with BADL/IADL. Patient's care partner is independent to provide the necessary cognitive assistance at discharge.    Reasons goals not met: n/a  Recommendation:  Patient will benefit from ongoing skilled OT services in outpatient setting to continue to advance functional skills in the area of BADL and iADL, however father/pt decline services at this time.  Equipment: shower chair  Reasons for discharge: treatment goals met  Patient/family agrees with progress made and goals achieved: Yes  OT Discharge Precautions/Restrictions  Precautions Precautions: Cervical Required Braces or Orthoses: Cervical Brace;Sling Cervical Brace: Hard collar;At all times Restrictions Weight  Bearing Restrictions: Yes LUE Weight Bearing: Weight bearing as tolerated General Chart Reviewed: Yes Family/Caregiver Present: No Vital Signs Therapy Vitals Temp: 98.3 F (36.8 C) Temp Source: Oral Pulse Rate: 79 Resp: 18 BP: 126/90 Patient Position (if appropriate): Lying Oxygen Therapy SpO2: 100 % O2 Device: Room Air Pain   ADL ADL Eating: Not assessed Where Assessed-Eating: Chair Grooming: Setup Where Assessed-Grooming: Standing at sink Upper Body Bathing: Setup Where Assessed-Upper Body Bathing: Shower Lower Body Bathing: Setup Where Assessed-Lower Body Bathing: Edge of bed Upper Body Dressing: Supervision/safety Where Assessed-Upper Body Dressing: Edge of bed Lower Body Dressing: Setup Where Assessed-Lower Body Dressing: Edge of bed Toileting: Setup Where Assessed-Toileting: Toilet Toilet Transfer: Distant supervision Toilet Transfer Method: Ambulating Toilet Transfer Equipment: Grab bars Tub/Shower Transfer: Close supervison Walk-In Shower Transfer: Unable to assess Vision Baseline Vision/History: No visual deficits Patient Visual Report: No change from baseline Vision Assessment?: No apparent visual deficits Eye Alignment: Within Functional Limits Perception  Perception: Within Functional Limits Praxis Praxis: Intact Cognition Overall Cognitive Status: Impaired/Different from baseline Arousal/Alertness: Awake/alert Orientation Level: Oriented X4 Attention: Sustained;Selective Focused Attention: Appears intact Sustained Attention: Appears intact Selective Attention: Impaired Selective Attention Impairment: Functional complex Memory: Impaired Memory Impairment: Decreased short term memory;Storage deficit Awareness: Impaired Awareness Impairment: Anticipatory impairment Problem Solving: Impaired Problem Solving Impairment: Functional complex Executive Function: Reasoning Reasoning: Impaired Reasoning Impairment: Functional complex Behaviors:  Poor frustration tolerance Safety/Judgment: Impaired Rancho Los Amigos Scales of Cognitive Functioning: Purposeful/appropriate Sensation Sensation Light Touch: Appears Intact Proprioception: Appears Intact Coordination Gross Motor Movements are Fluid and Coordinated: Yes Fine Motor Movements are Fluid and Coordinated: No Motor  Motor Motor: Abnormal postural alignment and control Motor - Skilled Clinical Observations: pt reports muscle cramping L shoulder Mobility    Transfers Sit to Stand: Independent Stand to Sit: Independent  Trunk/Postural Assessment  Cervical Assessment Cervical Assessment: Exceptions to WFL(cervical precautions) Thoracic Assessment Thoracic Assessment: Within Functional Limits Lumbar Assessment Lumbar Assessment: Within Functional Limits Postural Control Postural Control: Within Functional Limits  Balance Balance Balance Assessed: Yes Dynamic Sitting Balance Dynamic Sitting - Level of Assistance: 6: Modified independent (Device/Increase time) Dynamic Sitting - Balance Activities: Reaching across midline Dynamic Standing Balance Dynamic Standing - Balance Support: During functional activity;No upper extremity supported Dynamic Standing - Level of Assistance: 5: Stand by assistance Dynamic Standing - Balance Activities: Reaching for objects Extremity/Trunk Assessment RUE Assessment RUE Assessment: Exceptions to [facility] Strength Comments: AROM WFL and slightly deconditioned strength, but tremor present in entire limb  LUE Assessment LUE Assessment: Exceptions to [employer] LUE Body System: Ortho LUE AROM (degrees) Overall AROM Left Upper Extremity: Due to pain Left Shoulder Flexion: 5 Degrees Left Shoulder ABduction: 45 Degrees Left Elbow Flexion: 45 Left Wrist Extension: 20 Degrees Left Wrist Flexion: 65 Degrees LUE Strength LUE Overall Strength: Due to pain   [place] 02/19/2019, 6:58 AM
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
## 2019-02-19 NOTE — Progress Notes (Signed)
Physical Therapy Session Note  Patient Details  Name: Hunter Cook MRN: [id number] Date of Birth: 10-01-1996  Today's Date: 02/19/2019 PT Individual Time: 1450-1600 PT Individual Time Calculation (min): 70 min   Short Term Goals: Week 2:  PT Short Term Goal 1 (Week 1): pt will perform sit>< stand consistently with min assist or less assistance PT Short Term Goal 2 (Week 1): pt will perform gait x 200' without AD, with min assist consistently PT Short Term Goal 3 (Week 1): pt will ascend/descend 2 steps without rails with LRAD, mod assist  Skilled Therapeutic Interventions/Progress Updates:    Patient seated EOB agreeable to PT.  Patient S for sit to stand and ambulated to gym, noted wide BOS and able to demonstrate stair negotiation with S with rails x 12 steps.  Floor transfer performed with S and increased time and cues for technique.  Also discussed when appropriate to recover from falling and when to call 911.  Patient in requesting to toilet and gait to room S and toileted distant S.  Patient in dayroom to perform dynamic balance activities with Wii bowling game.  C/o R hand/wrist pain with quick movements.  Noted tenderness all around scab on volar surface of wrist and with skin changes (lacking hair) and errythema in periwound area.  RN informed.  Patient completed Berg balance assessment scoring 49/56 as noted below.  Educated in ongoing therapy and SW informed pt eager to participate if they can meet him at his aunt's home due to his dog not allowing visitors.  Patient left in recliner with waist alarm and needs in reach.   Therapy Documentation Precautions:  Precautions Precautions: Cervical Precaution Comments: trach Required Braces or Orthoses: Cervical Brace, Sling Cervical Brace: Hard collar, At all times Restrictions Weight Bearing Restrictions: Yes LUE Weight Bearing: Weight bearing as tolerated Pain: Pain Assessment Faces Pain Scale: Hurts even more Pain Type: Acute  pain Pain Location: Shoulder Pain Orientation: Left Pain Descriptors / Indicators: Aching Pain Onset: On-going Pain Intervention(s): Repositioned  Balance: Balance Balance Assessed: Yes Standardized Balance Assessment Standardized Balance Assessment: Berg Balance Test Berg Balance Test Sit to Stand: Able to stand without using hands and stabilize independently Standing Unsupported: Able to stand safely 2 minutes Sitting with Back Unsupported but Feet Supported on Floor or Stool: Able to sit safely and securely 2 minutes Stand to Sit: Sits safely with minimal use of hands Transfers: Able to transfer safely, minor use of hands Standing Unsupported with Eyes Closed: Able to stand 10 seconds safely Standing Ubsupported with Feet Together: Able to place feet together independently and stand 1 minute safely From Standing, Reach Forward with Outstretched Arm: Can reach confidently >25 cm (10") From Standing Position, Pick up Object from Floor: Able to pick up shoe, needs supervision From Standing Position, Turn to Look Behind Over each Shoulder: Turn sideways only but maintains balance Turn 360 Degrees: Able to turn 360 degrees safely one side only in 4 seconds or less Standing Unsupported, Alternately Place Feet on Step/Stool: Able to stand independently and safely and complete 8 steps in 20 seconds Standing Unsupported, One Foot in Front: Able to plae foot ahead of the other independently and hold 30 seconds Standing on One Leg: Able to lift leg independently and hold equal to or more than 3 seconds Total Score: 49    Therapy/Group: Individual Therapy  Elray Mcgregor  [place], [place] 02/19/2019, 6:06 PM

## 2019-02-20 MED ORDER — OXYCODONE HCL 10 MG PO TABS: 10.0000 mg | ORAL_TABLET | Freq: Four times a day (QID) | ORAL | 0 refills | Status: DC

## 2019-02-20 MED ORDER — CLONAZEPAM 0.25 MG PO TBDP: 0.2500 mg | ORAL_TABLET | Freq: Two times a day (BID) | ORAL | 0 refills | Status: DC

## 2019-02-20 MED ORDER — NICOTINE 14 MG/24HR TD PT24: MEDICATED_PATCH | TRANSDERMAL | 0 refills | Status: DC

## 2019-02-20 MED ORDER — QUETIAPINE FUMARATE 25 MG PO TABS: 25.0000 mg | ORAL_TABLET | Freq: Every day | ORAL | 0 refills | Status: DC

## 2019-02-20 MED ORDER — PANTOPRAZOLE SODIUM 40 MG PO TBEC: 40.0000 mg | DELAYED_RELEASE_TABLET | Freq: Every day | ORAL | 0 refills | Status: DC

## 2019-02-20 MED ORDER — PROPRANOLOL HCL 40 MG PO TABS: 40.0000 mg | ORAL_TABLET | Freq: Three times a day (TID) | ORAL | 0 refills | Status: DC

## 2019-02-20 MED ORDER — ACETAMINOPHEN 325 MG PO TABS: 650.0000 mg | ORAL_TABLET | Freq: Four times a day (QID) | ORAL | Status: DC | PRN

## 2019-02-20 MED ORDER — VALPROIC ACID 250 MG PO CAPS: 500.0000 mg | ORAL_CAPSULE | Freq: Two times a day (BID) | ORAL | 0 refills | Status: DC

## 2019-02-20 NOTE — Progress Notes (Signed)
Pt belongings pack. DC home today. Dad at valet parking for pick up. Nurse and PA took pt down with belongings. DC summary complete with dad and pt. No questions/concerns noted.   Ross Ludwig, LPN

## 2019-02-20 NOTE — Progress Notes (Signed)
[facility] PHYSICAL MEDICINE & REHABILITATION PROGRESS NOTE   Subjective/Complaints: Up at EOB. Anxious to get home. Father coming at 12:30  ROS: Patient denies fever, rash, sore throat, blurred vision, nausea, vomiting, diarrhea, cough, shortness of breath or chest pain, joint or back pain, headache, or mood change.    Objective:   No results found. No results for input(s): WBC, HGB, HCT, PLT in the last 72 hours. No results for input(s): NA, K, CL, CO2, GLUCOSE, BUN, CREATININE, CALCIUM in the last 72 hours.  Intake/Output Summary (Last 24 hours) at 02/20/2019 0957 Last data filed at 02/20/2019 0813 Gross per 24 hour  Intake 784 ml  Output 600 ml  Net 184 ml     Physical Exam: Vital Signs Blood pressure (!) 156/98, pulse 86, temperature 98.4 F (36.9 C), temperature source Oral, resp. rate 16, height 5\' 10"  (1.778 m), weight 84.2 kg, SpO2 100 %. Constitutional: No distress . Vital signs reviewed. HEENT: EOMI, oral membranes moist Neck: supple, stoma closed with granulation/debris Cardiovascular: RRR without murmur. No JVD    Respiratory: CTA Bilaterally without wheezes or rales. Normal effort    GI: BS +, non-tender, non-distended     Musculoskeletal:  Comments:left shoulder pain Neurological: He is alert. Limited concentration and focus. Moves all 4's with some limitations in LUE and proximal LE due to pain. Senses pain in all 4's.   Skin: Skin iswarm. Numerous lacs and abrasions healing Psychiatric: anxious, irritable    Assessment/Plan: 1. 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  Physiatrist is providing close team supervision and 24 hour management of active medical problems listed below.  Physiatrist and rehab team continue to assess barriers to discharge/monitor patient progress toward functional and medical goals  Care Tool:  Bathing  Bathing activity did not occur: Safety/medical concerns Body parts bathed by patient: Left arm, Chest, Abdomen, Front perineal area, Buttocks, Right upper leg, Left upper leg, Right lower leg, Left lower leg, Face, Right arm   Body parts bathed by helper: Right arm     Bathing assist Assist Level: Set up assist     Upper Body Dressing/Undressing Upper body dressing   What is the patient wearing?: Pull over shirt    Upper body assist Assist Level: Set up assist    Lower Body Dressing/Undressing Lower body dressing      What is the patient wearing?: Underwear/pull up, Pants     Lower body assist Assist for lower body dressing: Set up assist     Toileting Toileting    Toileting assist Assist for toileting: Set up assist     Transfers Chair/bed transfer  Transfers assist  Chair/bed transfer activity did not occur: Safety/medical concerns  Chair/bed transfer assist level: Supervision/Verbal cueing     Locomotion Ambulation   Ambulation assist      Assist level: Supervision/Verbal cueing Assistive device: (no device) Max distance: 150   Walk 10 feet activity   Assist     Assist level: Supervision/Verbal cueing     Walk 50 feet activity   Assist    Assist level: Supervision/Verbal cueing      Walk 150 feet activity   Assist    Assist level: Supervision/Verbal cueing      Walk 10 feet on uneven surface  activity   Assist     Assist level: Supervision/Verbal cueing     Wheelchair     Assist Will patient use  wheelchair at discharge?: No             Wheelchair 50 feet with 2 turns activity    Assist            Wheelchair 150 feet activity     Assist          Medical Problem List and Plan: 1.Decreased functional ability with cognitive deficitssecondary to TBI/shear injury/IVH, C7 fracture with cervical collar, T4 endplate fracture with CTO removed,multiple left rib fractures, 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  2. Antithrombotics: -DVT/anticoagulation:Subcutaneous Lovenox.Vascular study negative -antiplatelet therapy: None 3. Pain Management:Oxycodone as needed 4. Mood:konopin---can d/c  -continue propranolol,   40mg  tid   -antipsychotic agents: continue seroquel  25mg  qhs 5. Neuropsych: This patientis notcapable of making decisions on hisown behalf. 6. Skin/Wound Care:local care to trach stoma 7. Fluids/Electrolytes/Nutrition:encourage PO  -seems to have ample appetite 8. Acute hypoxic respiratory failure. 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   -decannulated without issues 3/20, stoma closed 9. Left scapular fracture/lleft elbow fracture dislocation status post ORIF 12/22/2018.Weightbearing as tolerated 10. Acute blood loss anemia. hgb stable at 12.6 3/18 11. Leukocytosis with Escherichia coli UTI.Completed 7 daycourse of Bactrim    12. Urinary retention.  -voiding well  -encourage up to bathroom to empty  -urecholine stopped 13. Hypertension/tachycardia.   -showing some improvement  -maintain propranolol at 40mg  tid 14. Seizure prophylaxis. Valproic acid 500 mg twice a day 15. History of alcohol polysubstance abuse.  On klonopin as above 16. Constipation. Laxative assistance 17. Dysphagia. Diet upgraded to regular  -eating well      LOS: 10 days A FACE TO FACE EVALUATION WAS PERFORMED  Ranelle Oyster 02/20/2019, 9:57 AM

## 2019-02-20 NOTE — Discharge Instructions (Signed)
Inpatient Rehab Discharge Instructions  Hunter Cook Discharge date and time: No discharge date for patient encounter.   Activities/Precautions/ Functional Status: Activity: weight bearing as tolerated/cervical collar at all times Diet: regular diet Wound Care: keep wound clean and dry Functional status:  ___ No restrictions     ___ Walk up steps independently ___ 24/7 supervision/assistance   ___ Walk up steps with assistance ___ Intermittent supervision/assistance  ___ Bathe/dress independently ___ Walk with walker     _x__ Bathe/dress with assistance ___ Walk Independently    ___ Shower independently ___ Walk with assistance    ___ Shower with assistance ___ No alcohol     ___ Return to work/school ________    COMMUNITY REFERRALS UPON DISCHARGE:    Home Health:   PT     OT     ST                      Agency:  [facility]   Phone:  [phone]   Medical Equipment/Items Ordered:  Tub bench                                                       Agency/Supplier:  [facility]  @ [phone]   GENERAL COMMUNITY RESOURCES FOR PATIENT/FAMILY:  Support Groups:  [place] [facility]        Special Instructions: No driving, smoking, alcohol use or illicit drug use   My questions have been answered and I understand these instructions. I will adhere to these goals and the provided educational materials after my discharge from the hospital.  Patient/Caregiver Signature _______________________________ Date __________  Clinician Signature _______________________________________ Date __________  Please bring this form and your medication list with you to all your follow-up doctor's appointments.

## 2019-02-23 NOTE — Progress Notes (Signed)
Social Work  Discharge Note  The overall goal for the admission was met for:   Discharge location: Yes - home with father who can provide 24/7 supervision.  Of note, pt and father changed their minds about the [facility] f/u and plan was for pt to be at the aunt's house (~ 2 doors down from father) to received [facility] services.  Length of Stay: Yes - 10 days  Discharge activity level: Yes - supervision  Home/community participation: Yes  Services provided included: MD, RD, PT, OT, SLP, RN, TR, Pharmacy, Neuropsych and SW  Financial Services: uninsured - [employer] counseling dept is following pt.  Medicaid and SSD applications are pending.  Follow-up services arranged: Home Health: PT, OT, ST via [facility], DME: tub bench via [facility] and Patient/Family has no preference for HH/DME agencies  Comments (or additional information):  Contact info:  Father, Hezakiah Champeau @ [phone] or [phone]   Melvern Sample @ (C) [phone]  Patient/Family verbalized understanding of follow-up arrangements: Yes  Individual responsible for coordination of the follow-up plan: pt  Confirmed correct DME delivered: Lennart Pall 02/23/2019    Jonda Alanis, [place] Nick

## 2019-02-24 ENCOUNTER — Other Ambulatory Visit: Payer: Self-pay

## 2019-02-24 ENCOUNTER — Encounter: Payer: Self-pay | Admitting: Registered Nurse

## 2019-02-25 ENCOUNTER — Encounter: Payer: Self-pay | Admitting: Registered Nurse

## 2019-02-25 ENCOUNTER — Telehealth: Payer: Self-pay | Admitting: *Deleted

## 2019-02-25 ENCOUNTER — Encounter: Payer: Medicaid Other | Attending: Registered Nurse | Admitting: Registered Nurse

## 2019-02-25 ENCOUNTER — Other Ambulatory Visit: Payer: Self-pay

## 2019-02-25 DIAGNOSIS — S143XXS Injury of brachial plexus, sequela: Secondary | ICD-10-CM | POA: Insufficient documentation

## 2019-02-25 DIAGNOSIS — S42102A Fracture of unspecified part of scapula, left shoulder, initial encounter for closed fracture: Secondary | ICD-10-CM | POA: Insufficient documentation

## 2019-02-25 DIAGNOSIS — S3609XA Other injury of spleen, initial encounter: Secondary | ICD-10-CM | POA: Insufficient documentation

## 2019-02-25 DIAGNOSIS — F319 Bipolar disorder, unspecified: Secondary | ICD-10-CM | POA: Insufficient documentation

## 2019-02-25 DIAGNOSIS — I615 Nontraumatic intracerebral hemorrhage, intraventricular: Secondary | ICD-10-CM

## 2019-02-25 DIAGNOSIS — F17210 Nicotine dependence, cigarettes, uncomplicated: Secondary | ICD-10-CM | POA: Insufficient documentation

## 2019-02-25 DIAGNOSIS — F419 Anxiety disorder, unspecified: Secondary | ICD-10-CM | POA: Insufficient documentation

## 2019-02-25 DIAGNOSIS — S52272A Monteggia's fracture of left ulna, initial encounter for closed fracture: Secondary | ICD-10-CM | POA: Insufficient documentation

## 2019-02-25 DIAGNOSIS — S069X9A Unspecified intracranial injury with loss of consciousness of unspecified duration, initial encounter: Secondary | ICD-10-CM | POA: Insufficient documentation

## 2019-02-25 DIAGNOSIS — F1910 Other psychoactive substance abuse, uncomplicated: Secondary | ICD-10-CM

## 2019-02-25 DIAGNOSIS — S2242XA Multiple fractures of ribs, left side, initial encounter for closed fracture: Secondary | ICD-10-CM

## 2019-02-25 MED ORDER — VALPROIC ACID 250 MG PO CAPS: 500.0000 mg | ORAL_CAPSULE | Freq: Two times a day (BID) | ORAL | 0 refills | Status: DC

## 2019-02-25 MED ORDER — PANTOPRAZOLE SODIUM 40 MG PO TBEC: 40.0000 mg | DELAYED_RELEASE_TABLET | Freq: Every day | ORAL | 0 refills | Status: DC

## 2019-02-25 MED ORDER — OXYCODONE HCL 10 MG PO TABS: 10.0000 mg | ORAL_TABLET | Freq: Four times a day (QID) | ORAL | 0 refills | Status: DC

## 2019-02-25 MED ORDER — CLONAZEPAM 0.25 MG PO TBDP: 0.2500 mg | ORAL_TABLET | Freq: Two times a day (BID) | ORAL | 0 refills | Status: DC

## 2019-02-25 MED ORDER — PROPRANOLOL HCL 40 MG PO TABS: 40.0000 mg | ORAL_TABLET | Freq: Three times a day (TID) | ORAL | 0 refills | Status: DC

## 2019-02-25 MED ORDER — QUETIAPINE FUMARATE 25 MG PO TABS: 25.0000 mg | ORAL_TABLET | Freq: Every day | ORAL | 0 refills | Status: DC

## 2019-02-25 NOTE — Telephone Encounter (Signed)
Mark Bias, PT, [facility] left a message asking for verbal orders for home health physical therapy 2week2.  Medical record reviewed. Social work note reviewed.  Verbal orders given per office protocol.

## 2019-02-25 NOTE — Progress Notes (Signed)
Virtual Transitional Care call Transitional Questions answered by Hunter Cook: Father  Patient name: Hunter Cook  DOB: May 23, 1996 1. Are you/is patient experiencing any problems since coming home? No a. Are there any questions regarding any aspect of care? No 2. Are there any questions regarding medications administration/dosing? No a. Are meds being taken as prescribed? Yes b. "Patient should review meds with caller to confirm" Medication list reviewed 3. Have there been any falls? No 4. Has Home Health been to the house and/or have they contacted you? Yes, [facility] a. If not, have you tried to contact them? NA b. Can we help you contact them? NA 5. Are bowels and bladder emptying properly? Yes a. Are there any unexpected incontinence issues? No b. If applicable, is patient following bowel/bladder programs? No 6. Any fevers, problems with breathing, unexpected pain? No 7. Are there any skin problems or new areas of breakdown? No 8. Has the patient/family member arranged specialty MD follow up (ie cardiology/neurology/renal/surgical/etc.)?  Hunter Cook will call for HFU appointments.  a. Can we help arrange? NA 9. Does the patient need any other services or support that we can help arrange? No 10. Are caregivers following through as expected in assisting the patient? Yes 11. Has the patient quit smoking, drinking alcohol, or using drugs as recommended? Hunter Cook according to his recollection Hunter Cook is not drinking alcohol, smoking or illicit drugs.  Appointment date/time 03/25/2019  arrival time 2:00 for 2:20 appointment, with Hunter Cook ANP-C. At [address] [employer] [facility] [address]

## 2019-02-26 ENCOUNTER — Telehealth: Payer: Self-pay | Admitting: *Deleted

## 2019-02-26 NOTE — Telephone Encounter (Signed)
Erin ST with [facility] called for POC 1wk1, 2wk2.  Approval given.

## 2019-03-02 ENCOUNTER — Telehealth: Payer: Self-pay | Admitting: *Deleted

## 2019-03-02 NOTE — Telephone Encounter (Signed)
OT POC request 1wk1. 2 wk4.  Approval given.

## 2019-03-04 ENCOUNTER — Other Ambulatory Visit (HOSPITAL_COMMUNITY): Payer: Self-pay | Admitting: Physician Assistant

## 2019-03-04 ENCOUNTER — Other Ambulatory Visit: Payer: Self-pay | Admitting: Physician Assistant

## 2019-03-04 DIAGNOSIS — S22040D Wedge compression fracture of fourth thoracic vertebra, subsequent encounter for fracture with routine healing: Secondary | ICD-10-CM

## 2019-03-04 DIAGNOSIS — S12601D Unspecified nondisplaced fracture of seventh cervical vertebra, subsequent encounter for fracture with routine healing: Secondary | ICD-10-CM

## 2019-03-11 DIAGNOSIS — J9600 Acute respiratory failure, unspecified whether with hypoxia or hypercapnia: Secondary | ICD-10-CM

## 2019-03-11 DIAGNOSIS — S42112D Displaced fracture of body of scapula, left shoulder, subsequent encounter for fracture with routine healing: Secondary | ICD-10-CM

## 2019-03-11 DIAGNOSIS — S42402D Unspecified fracture of lower end of left humerus, subsequent encounter for fracture with routine healing: Secondary | ICD-10-CM

## 2019-03-11 DIAGNOSIS — S12601D Unspecified nondisplaced fracture of seventh cervical vertebra, subsequent encounter for fracture with routine healing: Secondary | ICD-10-CM

## 2019-03-11 DIAGNOSIS — S2242XD Multiple fractures of ribs, left side, subsequent encounter for fracture with routine healing: Secondary | ICD-10-CM

## 2019-03-11 DIAGNOSIS — F17210 Nicotine dependence, cigarettes, uncomplicated: Secondary | ICD-10-CM

## 2019-03-11 DIAGNOSIS — F319 Bipolar disorder, unspecified: Secondary | ICD-10-CM

## 2019-03-11 DIAGNOSIS — I10 Essential (primary) hypertension: Secondary | ICD-10-CM

## 2019-03-24 ENCOUNTER — Encounter: Payer: Self-pay | Admitting: Physical Medicine & Rehabilitation

## 2019-03-24 ENCOUNTER — Other Ambulatory Visit: Payer: Self-pay

## 2019-03-24 ENCOUNTER — Encounter (HOSPITAL_BASED_OUTPATIENT_CLINIC_OR_DEPARTMENT_OTHER): Payer: Medicaid Other | Admitting: Physical Medicine & Rehabilitation

## 2019-03-24 VITALS — BP 138/90 | HR 92 | Resp 14 | Ht 68.0 in | Wt 190.0 lb

## 2019-03-24 DIAGNOSIS — S3609XA Other injury of spleen, initial encounter: Secondary | ICD-10-CM | POA: Diagnosis not present

## 2019-03-24 DIAGNOSIS — S069X9A Unspecified intracranial injury with loss of consciousness of unspecified duration, initial encounter: Secondary | ICD-10-CM | POA: Diagnosis not present

## 2019-03-24 DIAGNOSIS — S143XXS Injury of brachial plexus, sequela: Secondary | ICD-10-CM | POA: Diagnosis present

## 2019-03-24 DIAGNOSIS — F419 Anxiety disorder, unspecified: Secondary | ICD-10-CM | POA: Diagnosis not present

## 2019-03-24 DIAGNOSIS — F1910 Other psychoactive substance abuse, uncomplicated: Secondary | ICD-10-CM | POA: Diagnosis not present

## 2019-03-24 DIAGNOSIS — S2242XA Multiple fractures of ribs, left side, initial encounter for closed fracture: Secondary | ICD-10-CM | POA: Diagnosis not present

## 2019-03-24 DIAGNOSIS — F319 Bipolar disorder, unspecified: Secondary | ICD-10-CM | POA: Diagnosis not present

## 2019-03-24 DIAGNOSIS — S42102A Fracture of unspecified part of scapula, left shoulder, initial encounter for closed fracture: Secondary | ICD-10-CM | POA: Diagnosis present

## 2019-03-24 DIAGNOSIS — S52272A Monteggia's fracture of left ulna, initial encounter for closed fracture: Secondary | ICD-10-CM | POA: Diagnosis not present

## 2019-03-24 DIAGNOSIS — F17210 Nicotine dependence, cigarettes, uncomplicated: Secondary | ICD-10-CM | POA: Diagnosis not present

## 2019-03-24 MED ORDER — GABAPENTIN 300 MG PO CAPS: 300.0000 mg | ORAL_CAPSULE | Freq: Three times a day (TID) | ORAL | 3 refills | Status: DC

## 2019-03-24 MED ORDER — CLONAZEPAM 0.5 MG PO TABS: 0.5000 mg | ORAL_TABLET | Freq: Two times a day (BID) | ORAL | 1 refills | Status: DC

## 2019-03-24 MED ORDER — PROPRANOLOL HCL 40 MG PO TABS: 40.0000 mg | ORAL_TABLET | Freq: Three times a day (TID) | ORAL | 3 refills | Status: DC

## 2019-03-24 NOTE — Progress Notes (Signed)
Subjective:    Patient ID: Hunter Cook, male    DOB: [birth year], 23 y.o.   MRN: [id number]  HPI   Hunter Cook is here in follow up of his TBI. He was discharged at the end of March . Home health therapy has been coming out to the house, primarily OT. He is having problems with ongoing pain.  Sleep is an issue because of pain. He is sleeping 2 hours at a time. He tells me that NS ordered a CT of his neck to follow up his fracture and ortho referred him to Dr. Nedra Hai at [facility] as he is having ongoing numbness, weakness and pain in the left shoulder, arm, and hand. 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 He has run out of his medications at home. He had been using oxycodone for breakthrough pain. He's on nothing for neuropathic pain.   Anxiety remains a problem. He says a lot of it's due to his pain. He hasn't been drinking as "I have no money to pay for it.". his family is helping him financially with medications.   His left ankle/foot have been doing fairly well. He is walking without a device. He feels that his cognition is near baseline.   Pain Inventory Average Pain 7 Pain Right Now 7 My pain is constant and stabbing  In the last 24 hours, has pain interfered with the following? General activity 6 Relation with others 7 Enjoyment of life 10 What TIME of day is your pain at its worst? varies Sleep (in general) Poor  Pain is worse with: unsure Pain improves with: rest and medication Relief from Meds: 5  Mobility walk without assistance ability to climb steps?  yes do you drive?  no  Function not employed: date last employed .  Neuro/Psych numbness  Prior Studies hospital f/u  Physicians involved in your care hospital f/u   Family History  Problem Relation Age of Onset  . Heart attack Father   . CAD Father    Social History   Socioeconomic History  . Marital status: Single    Spouse name: Not on file  . Number of children: Not on file   . Years of education: Not on file  . Highest education level: Not on file  Occupational History  . Not on file  Social Needs  . Financial resource strain: Patient refused  . Food insecurity:    Worry: Patient refused    Inability: Patient refused  . Transportation needs:    Medical: Patient refused    Non-medical: Patient refused  Tobacco Use  . Smoking status: Current Every Day Smoker    Packs/day: 0.50    Years: 8.00    Pack years: 4.00    Types: Cigarettes  . Smokeless tobacco: Never Used  . Tobacco comment: quit using chewing tobacco  Substance and Sexual Activity  . Alcohol use: Yes    Comment: (father unclear of true amt - "a lot" socially 3-4 times a week  . Drug use: Yes    Types: "Crack" cocaine, Marijuana    Comment: rarely  . Sexual activity: Yes  Lifestyle  . Physical activity:    Days per week: Patient refused    Minutes per session: Patient refused  . Stress: Not on file  Relationships  . Social connections:    Talks on phone: Patient refused    Gets together: Patient refused    Attends religious service: Patient refused    Active  member of club or organization: Patient refused    Attends meetings of clubs or organizations: Patient refused    Relationship status: Patient refused  Other Topics Concern  . Not on file  Social History Narrative   ** Merged History Encounter **       Past Surgical History:  Procedure Laterality Date  . FOOT SURGERY Right 2015   crush injury - 2 screws and 1 plate surgically placed  . FRACTURE SURGERY    . HARDWARE REMOVAL Left 01/14/2017   Procedure: Left Ankle Syndesmotic Screw Removal;  Surgeon: Eldred MangesMark C Yates, MD;  [facility];  Service: Orthopedics;  Laterality: Left;  . INCISION AND DRAINAGE Right 2012   snake bite to hand; had ~ 6 ORs related to this  . INCISION AND DRAINAGE  2012   Multiple procedures due to snake bite  . LAPAROTOMY N/A 12/20/2018   Procedure: EXPLORATORY LAPAROTOMY; SPLEENECTOMY;  Surgeon:  Axel Filleramirez, Armando, MD;  [facility];  Service: General;  Laterality: N/A;  . ORIF ANKLE FRACTURE Left 10/22/2016   Procedure: OPEN REDUCTION INTERNAL FIXATION (ORIF) BIMALLEOLAR ANKLE FRACTURE;  Surgeon: Eldred MangesMark C Yates, MD;  Location: [facility] OR;  Service: Orthopedics;  Laterality: Left;  . ORIF ANKLE FRACTURE BIMALLEOLAR Left 10/22/2016  . ORIF ANKLE FRACTURE BIMALLEOLAR Left 2017  . ORIF ULNAR FRACTURE Left 12/22/2018   Procedure: OPEN REDUCTION INTERNAL FIXATION ULNAR FRACTURE;  Surgeon: Roby LoftsHaddix, Kevin P, MD;  Location: [facility] OR;  Service: Orthopedics;  Laterality: Left;  . SKIN GRAFT  2012   "related to snake bite"  . SKIN GRAFT  2013   when bitten by copperhead snake  . TRACHEOSTOMY TUBE PLACEMENT N/A 01/13/2019   Procedure: TRACHEOSTOMY;  Surgeon: Violeta Gelinashompson, Burke, MD;  [facility];  Service: General;  Laterality: N/A;   Past Medical History:  Diagnosis Date  . Bipolar 1 disorder (HCC)   . Bipolar disorder (HCC)   . Snake bite    BP 138/90   Pulse 92   Resp 14   Ht 5\' 8"  (1.727 m)   Wt 190 lb (86.2 kg)   HC 68" (172.7 cm)   SpO2 96%   BMI 28.89 kg/m   Opioid Risk Score:   Fall Risk Score:  `1  Depression screen PHQ 2/9  No flowsheet data found.  Review of Systems  Constitutional: Negative.   Eyes: Negative.   Respiratory: Negative.   Gastrointestinal: Negative.   Endocrine: Negative.  Negative for polyuria.  Genitourinary: Negative.   Musculoskeletal: Positive for arthralgias, back pain, neck pain and neck stiffness.  Skin: Negative.   Allergic/Immunologic: Negative.   Neurological: Positive for dizziness and headaches.  Psychiatric/Behavioral: The patient is nervous/anxious.   All other systems reviewed and are negative.      Objective:   Physical Exam  General: Alert and oriented x 3, No apparent distress HEENT: Head is normocephalic, atraumatic, PERRLA, EOMI, sclera anicteric, oral mucosa pink and moist, dentition intact, ext ear canals clear,  Neck: Supple  without JVD or lymphadenopathy Heart: Reg rate and rhythm. No murmurs rubs or gallops Chest: CTA bilaterally without wheezes, rales, or rhonchi; no distress Abdomen: Soft, non-tender, non-distended, bowel sounds positive. Extremities: No clubbing, cyanosis, or edema. Pulses are 2+ Skin: Clean and intact without signs of breakdown Neuro: LUE limited by pain. Has minimal sensation of LT from C6 to T1-2. Has limited flexion and extension of wrist. Minimal HI. Some preservation of eblow flexion, ?triceps?. Shoulder limited by pain.  Musculoskeletal: left shoulder pain Psych:  agitated and irritable.        Assessment & Plan:  1.Decreased functional ability with cognitive deficitssecondary to TBI/shear injury/IVH, C7 fracture with cervical collar, T4 endplate fracture with CTO removed,multiple left rib fractures, splenic rupture with splenectomy -ortho follow up as recommended 2.   Pain Management:wrote for gabapentin 300mg  tid  -not sure if he's going to take it 4. Mood:continue klonopin, can use 0.5mg  bid for anxiety, to help fall asleep 5. Left scapular fracture/lleft elbow fracture dislocation status post ORIF 01/27  -per ortho  -apparent brachial plexus injury evident now 6. Behavior: propranolol   -gabapentin            -   Fifteen minutes of face to face patient care time were spent during this visit. All questions were encouraged and answered.  Requested 2 month follow up. Pt left office without making another appt as he was expecting to be prescribed narcotics.

## 2019-03-24 NOTE — Patient Instructions (Signed)
CONTINUE TO WORK ON RANGING AND STRETCHING YOUR LEFT ARM AND HAND AS MUCH AS YOU CAN TOLERATE.

## 2019-03-25 ENCOUNTER — Encounter: Payer: Medicaid Other | Admitting: Registered Nurse

## 2019-03-26 ENCOUNTER — Encounter (HOSPITAL_COMMUNITY): Payer: Self-pay

## 2019-03-26 ENCOUNTER — Ambulatory Visit (HOSPITAL_COMMUNITY): Payer: Self-pay

## 2019-03-27 ENCOUNTER — Telehealth: Payer: Self-pay | Admitting: *Deleted

## 2019-03-27 NOTE — Telephone Encounter (Signed)
Harriett Sine, OT, [facility] left a message informing that HHPT is being put on hold due to patient having upcoming nerve study and ortho visit that will determine the course of treatment.  She is also asking for verbal orders for [facility] medical social work to address community resources, long term plans and establishing primary care .  Medical record reviewed. Social work note reviewed.  Verbal orders given per office protocol.

## 2019-03-31 ENCOUNTER — Ambulatory Visit (HOSPITAL_COMMUNITY)
Admission: RE | Admit: 2019-03-31 | Discharge: 2019-03-31 | Disposition: A | Discharge status: Home / Self Care | Payer: Medicaid Other | Source: Ambulatory Visit | Attending: Physician Assistant | Admitting: Physician Assistant

## 2019-03-31 ENCOUNTER — Other Ambulatory Visit: Payer: Self-pay

## 2019-03-31 ENCOUNTER — Other Ambulatory Visit: Payer: Self-pay | Admitting: Physician Assistant

## 2019-03-31 ENCOUNTER — Other Ambulatory Visit (HOSPITAL_COMMUNITY): Payer: Self-pay | Admitting: Physician Assistant

## 2019-03-31 DIAGNOSIS — S12601D Unspecified nondisplaced fracture of seventh cervical vertebra, subsequent encounter for fracture with routine healing: Secondary | ICD-10-CM | POA: Diagnosis not present

## 2019-03-31 DIAGNOSIS — I615 Nontraumatic intracerebral hemorrhage, intraventricular: Secondary | ICD-10-CM

## 2019-03-31 DIAGNOSIS — S22040D Wedge compression fracture of fourth thoracic vertebra, subsequent encounter for fracture with routine healing: Secondary | ICD-10-CM | POA: Insufficient documentation

## 2019-04-27 ENCOUNTER — Encounter (HOSPITAL_COMMUNITY): Payer: Self-pay

## 2019-04-27 ENCOUNTER — Ambulatory Visit (HOSPITAL_COMMUNITY): Admission: RE | Admit: 2019-04-27 | Payer: Self-pay | Source: Ambulatory Visit

## 2019-05-13 ENCOUNTER — Encounter: Payer: Medicaid Other | Attending: Registered Nurse | Admitting: Physical Medicine & Rehabilitation

## 2019-05-13 ENCOUNTER — Encounter: Payer: Self-pay | Admitting: Physical Medicine & Rehabilitation

## 2019-05-13 ENCOUNTER — Other Ambulatory Visit: Payer: Self-pay

## 2019-05-13 VITALS — BP 113/93 | HR 83 | Temp 98.2°F | Ht 68.0 in | Wt 206.0 lb

## 2019-05-13 DIAGNOSIS — F419 Anxiety disorder, unspecified: Secondary | ICD-10-CM | POA: Diagnosis not present

## 2019-05-13 DIAGNOSIS — F17210 Nicotine dependence, cigarettes, uncomplicated: Secondary | ICD-10-CM | POA: Insufficient documentation

## 2019-05-13 DIAGNOSIS — F319 Bipolar disorder, unspecified: Secondary | ICD-10-CM | POA: Diagnosis not present

## 2019-05-13 DIAGNOSIS — S143XXA Injury of brachial plexus, initial encounter: Secondary | ICD-10-CM | POA: Insufficient documentation

## 2019-05-13 DIAGNOSIS — S143XXS Injury of brachial plexus, sequela: Secondary | ICD-10-CM | POA: Diagnosis present

## 2019-05-13 DIAGNOSIS — S2242XA Multiple fractures of ribs, left side, initial encounter for closed fracture: Secondary | ICD-10-CM | POA: Insufficient documentation

## 2019-05-13 DIAGNOSIS — F1910 Other psychoactive substance abuse, uncomplicated: Secondary | ICD-10-CM | POA: Diagnosis not present

## 2019-05-13 DIAGNOSIS — S52272A Monteggia's fracture of left ulna, initial encounter for closed fracture: Secondary | ICD-10-CM | POA: Diagnosis not present

## 2019-05-13 DIAGNOSIS — S3609XA Other injury of spleen, initial encounter: Secondary | ICD-10-CM | POA: Diagnosis not present

## 2019-05-13 DIAGNOSIS — S069X9A Unspecified intracranial injury with loss of consciousness of unspecified duration, initial encounter: Secondary | ICD-10-CM | POA: Diagnosis not present

## 2019-05-13 DIAGNOSIS — S42102A Fracture of unspecified part of scapula, left shoulder, initial encounter for closed fracture: Secondary | ICD-10-CM | POA: Insufficient documentation

## 2019-05-13 DIAGNOSIS — S069X9S Unspecified intracranial injury with loss of consciousness of unspecified duration, sequela: Secondary | ICD-10-CM

## 2019-05-13 HISTORY — DX: Injury of brachial plexus, initial encounter: S14.3XXA

## 2019-05-13 NOTE — Progress Notes (Signed)
Subjective:    Patient ID: Hunter Cook, male    DOB: 04-01-1996, 23 y.o.   MRN: [id number]  HPI   Hunter Cook is here in follow up of his TBI and polytrauma. He tells me that he hasn't had much change.  He refused to take the gabapentin because he had tried some of his aunts gabapentin at home and it did not make much change over the course of a day or 2.  He had some nerve testing scheduled to orthopedic surgery but there have been problems getting this set up.  He states that he has called to try to arrange follow-up but has not had success.  I asked him what he is doing at home to maintain activity and range of motion and he really did not provide or offer much to me.  He is focused on an upcoming court date which may send him to prison for 2-1/2 years by his account.  He still takes some Seroquel Depakote and Klonopin at times but is not consistent.   Pain Inventory Average Pain 5 Pain Right Now 8 My pain is stabbing  In the last 24 hours, has pain interfered with the following? General activity 8 Relation with others 5 Enjoyment of life 10 What TIME of day is your pain at its worst? all Sleep (in general) Poor  Pain is worse with: bending, sitting and standing Pain improves with: medication Relief from Meds: 5  Mobility ability to climb steps?  yes do you drive?  no  Function not employed: date last employed January I need assistance with the following:  meal prep and household duties  Neuro/Psych bladder control problems weakness numbness confusion depression anxiety  Prior Studies CT/MRI  Physicians involved in your care Any changes since last visit?  no   Family History  Problem Relation Age of Onset  . Heart attack Father   . CAD Father    Social History   Socioeconomic History  . Marital status: Single    Spouse name: Not on file  . Number of children: Not on file  . Years of education: Not on file  . Highest education level: Not on file   Occupational History  . Not on file  Social Needs  . Financial resource strain: Patient refused  . Food insecurity    Worry: Patient refused    Inability: Patient refused  . Transportation needs    Medical: Patient refused    Non-medical: Patient refused  Tobacco Use  . Smoking status: Current Every Day Smoker    Packs/day: 0.50    Years: 8.00    Pack years: 4.00    Types: Cigarettes  . Smokeless tobacco: Never Used  . Tobacco comment: quit using chewing tobacco  Substance and Sexual Activity  . Alcohol use: Yes    Comment: (father unclear of true amt - "a lot" socially 3-4 times a week  . Drug use: Yes    Types: "Crack" cocaine, Marijuana    Comment: rarely  . Sexual activity: Yes  Lifestyle  . Physical activity    Days per week: Patient refused    Minutes per session: Patient refused  . Stress: Not on file  Relationships  . Social Herbalist on phone: Patient refused    Gets together: Patient refused    Attends religious service: Patient refused    Active member of club or organization: Patient refused    Attends meetings of clubs or organizations: Patient refused  Relationship status: Patient refused  Other Topics Concern  . Not on file  Social History Narrative   ** Merged History Encounter **       Past Surgical History:  Procedure Laterality Date  . FOOT SURGERY Right 2015   crush injury - 2 screws and 1 plate surgically placed  . FRACTURE SURGERY    . HARDWARE REMOVAL Left 01/14/2017   Procedure: Left Ankle Syndesmotic Screw Removal;  Surgeon: Eldred MangesMark C Yates, MD;  [facility];  Service: Orthopedics;  Laterality: Left;  . INCISION AND DRAINAGE Right 2012   snake bite to hand; had ~ 6 ORs related to this  . INCISION AND DRAINAGE  2012   Multiple procedures due to snake bite  . LAPAROTOMY N/A 12/20/2018   Procedure: EXPLORATORY LAPAROTOMY; SPLEENECTOMY;  Surgeon: Axel Filleramirez, Armando, MD;  [facility];  Service: General;  Laterality: N/A;  .  ORIF ANKLE FRACTURE Left 10/22/2016   Procedure: OPEN REDUCTION INTERNAL FIXATION (ORIF) BIMALLEOLAR ANKLE FRACTURE;  Surgeon: Eldred MangesMark C Yates, MD;  Location: [facility] OR;  Service: Orthopedics;  Laterality: Left;  . ORIF ANKLE FRACTURE BIMALLEOLAR Left 10/22/2016  . ORIF ANKLE FRACTURE BIMALLEOLAR Left 2017  . ORIF ULNAR FRACTURE Left 12/22/2018   Procedure: OPEN REDUCTION INTERNAL FIXATION ULNAR FRACTURE;  Surgeon: Roby LoftsHaddix, Kevin P, MD;  Location: [facility] OR;  Service: Orthopedics;  Laterality: Left;  . SKIN GRAFT  2012   "related to snake bite"  . SKIN GRAFT  2013   when bitten by copperhead snake  . TRACHEOSTOMY TUBE PLACEMENT N/A 01/13/2019   Procedure: TRACHEOSTOMY;  Surgeon: Violeta Gelinashompson, Burke, MD;  [facility];  Service: General;  Laterality: N/A;   Past Medical History:  Diagnosis Date  . Bipolar 1 disorder (HCC)   . Bipolar disorder (HCC)   . Snake bite    BP (!) 113/93   Pulse 83   Temp 98.2 F (36.8 C)   Ht 5\' 8"  (1.727 m)   Wt 206 lb (93.4 kg)   SpO2 97%   BMI 31.32 kg/m   Opioid Risk Score:   Fall Risk Score:  `1  Depression screen PHQ 2/9  Depression screen PHQ 2/9 05/13/2019  Decreased Interest 1  Down, Depressed, Hopeless 1  PHQ - 2 Score 2     Review of Systems  Constitutional: Negative.   HENT: Negative.   Eyes: Negative.   Respiratory: Negative.   Cardiovascular: Negative.   Gastrointestinal: Negative.   Endocrine: Negative.   Genitourinary: Positive for enuresis.  Musculoskeletal: Negative.   Skin: Negative.   Allergic/Immunologic: Negative.   Neurological: Positive for weakness and numbness.  Hematological: Negative.   Psychiatric/Behavioral: Positive for confusion and dysphoric mood. The patient is nervous/anxious.   All other systems reviewed and are negative.      Objective:   Physical Exam General: No acute distress HEENT: EOMI, oral membranes moist Cards: reg rate  Chest: normal effort Abdomen: Soft, NT, ND Skin: dry, intact Extremities:  no edema   Skin: Clean and intact without signs of breakdown Neuro: LUE limited by pain. Has minimal sensation of LT from C6 to T1-2. Has limited flexion and extension of wrist.  Patient has some preservation of hand intrinsics as well as wrist extension elbow flexion and extension.  Shoulder movement is very limited due to pain.    He has atrophy of the deltoid and rotator cuff muscles. Musculoskeletal: left shoulder pain with minimal movement Psych:  Remains very agitated and irritable  Assessment & Plan:  1.Decreased functional ability with cognitive deficitssecondary to TBI/shear injury/IVH, C7 fracture with cervical collar, T4 endplate fracture with CTO removed,multiple left rib fractures, splenic rupture with splenectomy -ortho follow up as recommended 2.   Pain Management:Suggested gabapentin again today, the patient refused stating he was not going to pay for something that might not work.   4. Mood:Remains agitated and irritable which may not be far from his baseline 5. 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 Behavior:    Patient still very irritable today and really was oppositional to anything suggested to help treat his symptoms or pain.  Not sure if he is angling towards receiving narcotics but those will be written through this office.  We will see him back on an as-needed basis at this point because I have nothing further to offer given his defiant personality.                     -

## 2019-05-13 NOTE — Patient Instructions (Addendum)
PLEASE FEEL FREE TO CALL OUR OFFICE WITH ANY PROBLEMS OR QUESTIONS ([phone])    THE GABAPENTIN CAN HELP WITH YOUR NERVE PAIN BUT YOU HAVE TO TAKE IT ON A CONSISTENT BASIS. YOU'RE ON A STARTING DOSE ALSO

## 2019-05-21 ENCOUNTER — Other Ambulatory Visit: Payer: Self-pay

## 2019-05-21 NOTE — Telephone Encounter (Signed)
Patient called and left a message on nurse line, requesting a refill for klonopin medication.  According to last note written on 05-13-2019:  6. Behavior:  Patient still very irritable today and really was oppositional to anything suggested to help treat his symptoms or pain.  Not sure if he is angling towards receiving narcotics but those will be written through this office.  We will see him back on an as-needed basis at this point because I have nothing further to offer given his defiant personality.  Unsure if ok to refill medication.  Please advise.

## 2019-05-22 NOTE — Telephone Encounter (Signed)
NOtified.  He does not have a PCP. I have directed him to call [facility].

## 2019-05-22 NOTE — Telephone Encounter (Signed)
Absolutely not. He left without accepting paperwork or anything that I had to offer. No followup was made

## 2019-05-26 ENCOUNTER — Ambulatory Visit: Payer: Self-pay | Admitting: Physical Medicine & Rehabilitation

## 2019-06-12 ENCOUNTER — Encounter (HOSPITAL_COMMUNITY): Payer: Self-pay | Admitting: Emergency Medicine

## 2019-06-12 ENCOUNTER — Emergency Department (HOSPITAL_COMMUNITY): Payer: Medicaid Other

## 2019-06-12 ENCOUNTER — Emergency Department (HOSPITAL_COMMUNITY)
Admission: EM | Admit: 2019-06-12 | Discharge: 2019-06-12 | Disposition: A | Discharge status: Home / Self Care | Payer: Medicaid Other | Attending: Emergency Medicine | Admitting: Emergency Medicine

## 2019-06-12 ENCOUNTER — Other Ambulatory Visit: Payer: Self-pay

## 2019-06-12 DIAGNOSIS — F17210 Nicotine dependence, cigarettes, uncomplicated: Secondary | ICD-10-CM | POA: Diagnosis not present

## 2019-06-12 DIAGNOSIS — T391X4A Poisoning by 4-Aminophenol derivatives, undetermined, initial encounter: Secondary | ICD-10-CM | POA: Insufficient documentation

## 2019-06-12 DIAGNOSIS — R4182 Altered mental status, unspecified: Secondary | ICD-10-CM | POA: Diagnosis present

## 2019-06-12 DIAGNOSIS — R51 Headache: Secondary | ICD-10-CM | POA: Diagnosis not present

## 2019-06-12 DIAGNOSIS — F1910 Other psychoactive substance abuse, uncomplicated: Secondary | ICD-10-CM

## 2019-06-12 DIAGNOSIS — Z79899 Other long term (current) drug therapy: Secondary | ICD-10-CM | POA: Insufficient documentation

## 2019-06-12 DIAGNOSIS — J3489 Other specified disorders of nose and nasal sinuses: Secondary | ICD-10-CM | POA: Insufficient documentation

## 2019-06-12 DIAGNOSIS — R600 Localized edema: Secondary | ICD-10-CM | POA: Insufficient documentation

## 2019-06-12 DIAGNOSIS — R45851 Suicidal ideations: Secondary | ICD-10-CM | POA: Insufficient documentation

## 2019-06-12 LAB — HEPATIC FUNCTION PANEL
ALT: 29 U/L (ref 0–44)
ALT: 22 U/L (ref 0–44)
AST: 27 U/L (ref 15–41)
AST: 22 U/L (ref 15–41)
Albumin: 4.4 g/dL (ref 3.5–5.0)
Albumin: 3.8 g/dL (ref 3.5–5.0)
Alkaline Phosphatase: 77 U/L (ref 38–126)
Alkaline Phosphatase: 66 U/L (ref 38–126)
Bilirubin, Direct: 0.1 mg/dL (ref 0.0–0.2)
Bilirubin, Direct: <0.1 mg/dL (ref 0.0–0.2)
Indirect Bilirubin: 0.1 mg/dL — ABNORMAL LOW (ref 0.3–0.9)
Total Bilirubin: 0.2 mg/dL — ABNORMAL LOW (ref 0.3–1.2)
Total Bilirubin: 0.3 mg/dL (ref 0.3–1.2)
Total Protein: 7.7 g/dL (ref 6.5–8.1)
Total Protein: 6.4 g/dL — ABNORMAL LOW (ref 6.5–8.1)

## 2019-06-12 LAB — CBG MONITORING, ED: Glucose-Capillary: 127 mg/dL — ABNORMAL HIGH (ref 70–99)

## 2019-06-12 LAB — CBC WITH DIFFERENTIAL/PLATELET
Abs Immature Granulocytes: 0.03 10*3/uL (ref 0.00–0.07)
Basophils Absolute: 0.1 10*3/uL (ref 0.0–0.1)
Basophils Relative: 1 %
Eosinophils Absolute: 0.1 10*3/uL (ref 0.0–0.5)
Eosinophils Relative: 1 %
HCT: 51.3 % (ref 39.0–52.0)
Hemoglobin: 17.7 g/dL — ABNORMAL HIGH (ref 13.0–17.0)
Immature Granulocytes: 0 %
Lymphocytes Relative: 35 %
Lymphs Abs: 4 10*3/uL (ref 0.7–4.0)
MCH: 31.3 pg (ref 26.0–34.0)
MCHC: 34.5 g/dL (ref 30.0–36.0)
MCV: 90.8 fL (ref 80.0–100.0)
Monocytes Absolute: 0.8 10*3/uL (ref 0.1–1.0)
Monocytes Relative: 7 %
Neutro Abs: 6.6 10*3/uL (ref 1.7–7.7)
Neutrophils Relative %: 56 %
Platelets: 435 10*3/uL — ABNORMAL HIGH (ref 150–400)
RBC: 5.65 MIL/uL (ref 4.22–5.81)
RDW: 13.8 % (ref 11.5–15.5)
WBC: 11.7 10*3/uL — ABNORMAL HIGH (ref 4.0–10.5)
nRBC: 0 % (ref 0.0–0.2)

## 2019-06-12 LAB — RAPID URINE DRUG SCREEN, HOSP PERFORMED
Amphetamines: NOT DETECTED
Barbiturates: NOT DETECTED
Benzodiazepines: POSITIVE — AB
Cocaine: NOT DETECTED
Opiates: POSITIVE — AB
Tetrahydrocannabinol: POSITIVE — AB

## 2019-06-12 LAB — COMPREHENSIVE METABOLIC PANEL
ALT: 28 U/L (ref 0–44)
AST: 28 U/L (ref 15–41)
Albumin: 4.4 g/dL (ref 3.5–5.0)
Alkaline Phosphatase: 84 U/L (ref 38–126)
Anion gap: 12 (ref 5–15)
BUN: 6 mg/dL (ref 6–20)
CO2: 22 mmol/L (ref 22–32)
Calcium: 9.1 mg/dL (ref 8.9–10.3)
Chloride: 106 mmol/L (ref 98–111)
Creatinine, Ser: 0.99 mg/dL (ref 0.61–1.24)
GFR calc Af Amer: >60 mL/min (ref >60)
GFR calc non Af Amer: >60 mL/min (ref >60)
Glucose, Bld: 134 mg/dL — ABNORMAL HIGH (ref 70–99)
Potassium: 3.7 mmol/L (ref 3.5–5.1)
Sodium: 140 mmol/L (ref 135–145)
Total Bilirubin: 0.6 mg/dL (ref 0.3–1.2)
Total Protein: 7.5 g/dL (ref 6.5–8.1)

## 2019-06-12 LAB — PROTIME-INR
INR: 1 (ref 0.8–1.2)
Prothrombin Time: 13.3 seconds (ref 11.4–15.2)

## 2019-06-12 LAB — ACETAMINOPHEN LEVEL
Acetaminophen (Tylenol), Serum: 70 ug/mL — ABNORMAL HIGH (ref 10–30)
Acetaminophen (Tylenol), Serum: 67 ug/mL — ABNORMAL HIGH (ref 10–30)

## 2019-06-12 LAB — SALICYLATE LEVEL: Salicylate Lvl: <7 mg/dL (ref 2.8–30.0)

## 2019-06-12 LAB — ETHANOL: Alcohol, Ethyl (B): 148 mg/dL — ABNORMAL HIGH (ref <10)

## 2019-06-12 MED ORDER — CHARCOAL ACTIVATED PO LIQD
100.0000 g | Freq: Once | ORAL | Status: AC
  Administered 2019-06-12: 100 g via ORAL
  Filled 2019-06-12: qty 480

## 2019-06-12 MED ORDER — SODIUM CHLORIDE 0.9 % IV BOLUS
1000.0000 mL | Freq: Once | INTRAVENOUS | Status: AC
  Administered 2019-06-12: 1000 mL via INTRAVENOUS

## 2019-06-12 MED ORDER — KETOROLAC TROMETHAMINE 15 MG/ML IJ SOLN
15.0000 mg | Freq: Once | INTRAMUSCULAR | Status: AC
  Administered 2019-06-12: 15 mg via INTRAVENOUS
  Filled 2019-06-12: qty 1

## 2019-06-12 NOTE — ED Notes (Signed)
Poison control called for patient status. Poison control recommends benzos for any agitation that may present as a result of the diphenhydramine intact. Repeat EKG also needed.
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
## 2019-06-12 NOTE — ED Notes (Signed)
Pt CBG was 127, notified Hannie(RN)

## 2019-06-12 NOTE — ED Notes (Signed)
Pt aware that a urine sample is needed; urinal at bedside 

## 2019-06-12 NOTE — ED Notes (Signed)
Pt states he will go ahead and take the Tordol medication "even though its not going to work."

## 2019-06-12 NOTE — ED Notes (Signed)
Pt refusing medication at this time

## 2019-06-12 NOTE — Discharge Instructions (Signed)
If you feel suicidal thoughts/feelings, wanting to harm someone else, or any other new/concerning symptoms then call 911 and/or return to the ER for evaluation.

## 2019-06-12 NOTE — ED Triage Notes (Signed)
BIB [facility] from home. Pt reports taking 30-40 Tylenol PM and 7-10 Oxycodone @0540 . Pt stated he was "trying to prove to his father that he is needed."

## 2019-06-12 NOTE — ED Notes (Signed)
Contacted poison control, spoke with Eustaquio Maize, Therapist, sports; per [employer], recommend activated charcoal w/o sorbitol 1g/kg; cmp with liver enzymes; ekg now and then q4h, and observe pt for a minimum of 6 hours or until alert and oriented with stable vitals; Notified Dr. Regenia Skeeter of reccomendations

## 2019-06-28 ENCOUNTER — Emergency Department (HOSPITAL_BASED_OUTPATIENT_CLINIC_OR_DEPARTMENT_OTHER): Payer: Medicaid Other

## 2019-06-28 ENCOUNTER — Other Ambulatory Visit: Payer: Self-pay

## 2019-06-28 ENCOUNTER — Encounter (HOSPITAL_COMMUNITY): Payer: Self-pay | Admitting: Emergency Medicine

## 2019-06-28 ENCOUNTER — Emergency Department (HOSPITAL_COMMUNITY)
Admission: EM | Admit: 2019-06-28 | Discharge: 2019-06-28 | Disposition: A | Discharge status: Home / Self Care | Payer: Medicaid Other | Attending: Emergency Medicine | Admitting: Emergency Medicine

## 2019-06-28 DIAGNOSIS — Z87820 Personal history of traumatic brain injury: Secondary | ICD-10-CM | POA: Insufficient documentation

## 2019-06-28 DIAGNOSIS — R52 Pain, unspecified: Secondary | ICD-10-CM

## 2019-06-28 DIAGNOSIS — I82402 Acute embolism and thrombosis of unspecified deep veins of left lower extremity: Secondary | ICD-10-CM | POA: Diagnosis not present

## 2019-06-28 DIAGNOSIS — F17210 Nicotine dependence, cigarettes, uncomplicated: Secondary | ICD-10-CM | POA: Insufficient documentation

## 2019-06-28 DIAGNOSIS — M79605 Pain in left leg: Secondary | ICD-10-CM

## 2019-06-28 DIAGNOSIS — F319 Bipolar disorder, unspecified: Secondary | ICD-10-CM | POA: Diagnosis not present

## 2019-06-28 DIAGNOSIS — Z79899 Other long term (current) drug therapy: Secondary | ICD-10-CM | POA: Diagnosis not present

## 2019-06-28 LAB — BASIC METABOLIC PANEL
Anion gap: 15 (ref 5–15)
BUN: 8 mg/dL (ref 6–20)
CO2: 22 mmol/L (ref 22–32)
Calcium: 9.9 mg/dL (ref 8.9–10.3)
Chloride: 101 mmol/L (ref 98–111)
Creatinine, Ser: 0.9 mg/dL (ref 0.61–1.24)
GFR calc Af Amer: >60 mL/min (ref >60)
GFR calc non Af Amer: >60 mL/min (ref >60)
Glucose, Bld: 86 mg/dL (ref 70–99)
Potassium: 4.1 mmol/L (ref 3.5–5.1)
Sodium: 138 mmol/L (ref 135–145)

## 2019-06-28 LAB — CBC WITH DIFFERENTIAL/PLATELET
Abs Immature Granulocytes: 0.05 10*3/uL (ref 0.00–0.07)
Basophils Absolute: 0.1 10*3/uL (ref 0.0–0.1)
Basophils Relative: 1 %
Eosinophils Absolute: 0.2 10*3/uL (ref 0.0–0.5)
Eosinophils Relative: 2 %
HCT: 51.7 % (ref 39.0–52.0)
Hemoglobin: 17.8 g/dL — ABNORMAL HIGH (ref 13.0–17.0)
Immature Granulocytes: 0 %
Lymphocytes Relative: 29 %
Lymphs Abs: 3.6 10*3/uL (ref 0.7–4.0)
MCH: 31.3 pg (ref 26.0–34.0)
MCHC: 34.4 g/dL (ref 30.0–36.0)
MCV: 91 fL (ref 80.0–100.0)
Monocytes Absolute: 1.1 10*3/uL — ABNORMAL HIGH (ref 0.1–1.0)
Monocytes Relative: 9 %
Neutro Abs: 7.7 10*3/uL (ref 1.7–7.7)
Neutrophils Relative %: 59 %
Platelets: 350 10*3/uL (ref 150–400)
RBC: 5.68 MIL/uL (ref 4.22–5.81)
RDW: 13.8 % (ref 11.5–15.5)
WBC: 12.7 10*3/uL — ABNORMAL HIGH (ref 4.0–10.5)
nRBC: 0 % (ref 0.0–0.2)

## 2019-06-28 MED ORDER — IBUPROFEN 400 MG PO TABS
600.0000 mg | ORAL_TABLET | Freq: Once | ORAL | Status: AC
  Administered 2019-06-28: 600 mg via ORAL
  Filled 2019-06-28: qty 1

## 2019-06-28 MED ORDER — RIVAROXABAN 20 MG PO TABS: 20.0000 mg | ORAL_TABLET | Freq: Every day | ORAL | Status: DC

## 2019-06-28 MED ORDER — RIVAROXABAN 15 MG PO TABS: 15.0000 mg | ORAL_TABLET | Freq: Two times a day (BID) | ORAL | Status: DC

## 2019-06-28 MED ORDER — RIVAROXABAN (XARELTO) EDUCATION KIT FOR DVT/PE PATIENTS
PACK | Freq: Once | Status: AC
  Administered 2019-06-28: 17:00:00
  Filled 2019-06-28: qty 1

## 2019-06-28 MED ORDER — RIVAROXABAN 15 MG PO TABS
15.0000 mg | ORAL_TABLET | Freq: Once | ORAL | Status: AC
  Administered 2019-06-28: 15 mg via ORAL
  Filled 2019-06-28: qty 1
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
## 2019-06-28 NOTE — Progress Notes (Signed)
Left lower extremity venous duplex has been completed. Preliminary results can be found in CV Proc through chart review.  Results were given to Eustaquio Maize PA.  06/28/19 4:13 PM Hunter Cook RVT

## 2019-06-28 NOTE — Progress Notes (Signed)
ANTICOAGULATION CONSULT NOTE - Initial Consult  Pharmacy Consult for rivaroxaban Indication: DVT  No Known Allergies  Patient Measurements: Height: 5\' 8"  (172.7 cm) Weight: 198 lb 6.6 oz (90 kg) IBW/kg (Calculated) : 68.4   Vital Signs: Temp: 98.1 F (36.7 C) (08/02 1440) Temp Source: Oral (08/02 1440) BP: 155/112 (08/02 1440) Pulse Rate: 115 (08/02 1440)  Labs: No results for input(s): HGB, HCT, PLT, APTT, LABPROT, INR, HEPARINUNFRC, HEPRLOWMOCWT, CREATININE, CKTOTAL, CKMB, TROPONINIHS in the last 72 hours.  Estimated Creatinine Clearance: 124.2 mL/min (by C-G formula based on SCr of 0.99 mg/dL).   Medical History: Past Medical History:  Diagnosis Date  . Bipolar 1 disorder ([place])   . Bipolar disorder ([place])   . Snake bite      Assessment: 23 yo male presents on 06/28/2019 with 3 day history of left knee and calf pain. Pharmacy consulted to dose Xarelto for VTE.Doppler positive for acute deep vein thrombosis involving the left popliteal vein, left posterior tibial veins, and left peroneal veins.  No history of DVT or anticoagulation PTA.  No Hgb. No reported bleeding.   Goal of Therapy:  Monitor platelets by anticoagulation protocol: Yes   Plan:  Xarelto 15mg  BID 06/28/2019 - 07/19/2019  Start Xarelto 20mg  daily on 07/20/2019 Monitor for S/S of bleeding  Pharmacy to educate patient  Cristela Felt, PharmD PGY1 Pharmacy Resident [employer]: [phone]  06/28/2019,4:46 PM

## 2019-06-28 NOTE — ED Triage Notes (Signed)
Onset 3 days developed pain behind left knee and continues today radiating up and down leg.

## 2019-06-28 NOTE — ED Provider Notes (Signed)
Patient signed out to me by M. Alroy Bailiff, PA-C.  Please see previous notes for further history.  In brief, patient presenting for evaluation of left posterior knee and calf pain for 3 days.  On exam, slight increased swelling on the left side.  Ultrasound pending.  Ultrasound positive for DVT.  Patient without chest pain or shortness of breath.  On arrival, heart rate was slightly elevated at 115, although this was resolved without intervention, heart rate currently 86.  Will obtain basic labs to check kidney function, start patient on Xarelto.  Case management consulted as patient does not have a PCP.  Labs reassuring.  Heart rate remained stable.  Discussed with patient.  Discussed importance of taking medication and follow-up with PCP.  At this time, patient appears safe for discharge.  Return precautions given.  Patient states he understands and agrees plan.   Franchot Heidelberg, PA-C 06/28/19 2015    Charlesetta Shanks, MD 07/13/19 1119

## 2019-06-28 NOTE — ED Triage Notes (Signed)
US at bedside

## 2019-06-28 NOTE — Discharge Instructions (Addendum)
Return to the emergency room if you develop chest pain or shortness of breath. It is important that you follow-up with the primary care clinic listed below.   Information on my medicine - XARELTO (rivaroxaban)  This medication education was reviewed with me or my healthcare representative as part of my discharge preparation.  The pharmacist that spoke with me during my hospital stay was:    WHY WAS XARELTO PRESCRIBED FOR YOU? Xarelto was prescribed to treat blood clots that may have been found in the veins of your legs (deep vein thrombosis) or in your lungs (pulmonary embolism) and to reduce the risk of them occurring again.  WHAT DO YOU NEED TO KNOW ABOUT XARELTO? The starting dose is one 15 mg tablet taken TWICE daily with food for the FIRST 21 DAYS then on  07/20/2019  the dose is changed to one 20 mg tablet taken ONCE A DAY with your evening meal.  DO NOT stop taking Xarelto without talking to the health care provider who prescribed the medication.  Refill your prescription for 20 mg tablets before you run out.  After discharge, you should have regular check-up appointments with your healthcare provider that is prescribing your Xarelto.  In the future your dose may need to be changed if your kidney function changes by a significant amount.  WHAT DO YOU DO IF YOU MISS A DOSE? If you are taking Xarelto TWICE DAILY and you miss a dose, take it as soon as you remember. You may take two 15 mg tablets (total 30 mg) at the same time then resume your regularly scheduled 15 mg twice daily the next day.  If you are taking Xarelto ONCE DAILY and you miss a dose, take it as soon as you remember on the same day then continue your regularly scheduled once daily regimen the next day. Do not take two doses of Xarelto at the same time.   IMPORTANT SAFETY INFORMATION Xarelto is a blood thinner medicine that can cause bleeding. You should call your healthcare provider right away if you experience  any of the following: Bleeding from an injury or your nose that does not stop. Unusual colored urine (red or dark brown) or unusual colored stools (red or black). Unusual bruising for unknown reasons. A serious fall or if you hit your head (even if there is no bleeding).  Some medicines may interact with Xarelto and might increase your risk of bleeding while on Xarelto. To help avoid this, consult your healthcare provider or pharmacist prior to using any new prescription or non-prescription medications, including herbals, vitamins, non-steroidal anti-inflammatory drugs (NSAIDs) and supplements.  This website has more information on Xarelto: [email or web address].[email or web address]

## 2019-06-28 NOTE — ED Notes (Signed)
Patient verbalizes understanding of discharge instructions . Opportunity for questions and answers were provided . Armband removed by staff ,Pt discharged from ED. W/C  offered at D/C  and Declined W/C at D/C and was escorted to lobby by RN.  

## 2019-06-28 NOTE — Care Management (Signed)
ED CM consulted concerning  medication assistance for Xarelto, and follow up care.  CM met with patient to discuss follow up care, patient verified he does not have a PCP he does have [place] Medicaid. Patient is from [place] but does have transportation and would like to attend the [facility] which he has heard about. CM provided patient clinic's information to contact them in the morning and also forwarded his information to the clinic scheduler as well. CM also provided a Xarelto 30 day free savings card with instructions on how to redeem, Patient verbalized understanding and teach back done.  No further ED CM needs identified.

## 2019-06-29 ENCOUNTER — Telehealth (HOSPITAL_COMMUNITY): Payer: Self-pay | Admitting: Physician Assistant

## 2019-06-29 MED ORDER — RIVAROXABAN (XARELTO) VTE STARTER PACK (15 & 20 MG): ORAL_TABLET | ORAL | 0 refills | Status: DC

## 2019-06-29 NOTE — Telephone Encounter (Signed)
Patient did not receive prescription for Xarelto.  He was seen for DVT, given his first dose here in the ED.

## 2019-08-05 ENCOUNTER — Ambulatory Visit: Payer: Medicaid Other | Attending: Critical Care Medicine | Admitting: Critical Care Medicine

## 2019-08-05 ENCOUNTER — Other Ambulatory Visit: Payer: Self-pay

## 2019-08-05 DIAGNOSIS — I82432 Acute embolism and thrombosis of left popliteal vein: Secondary | ICD-10-CM | POA: Insufficient documentation

## 2019-08-05 NOTE — Progress Notes (Signed)
Pt states he has pain coming from his left side of his body   Pt is requesting a refill on his xanax and xarelto

## 2019-08-05 NOTE — Progress Notes (Signed)
Patient ID: Hunter Cook, male   DOB: [birth year], 23 y.o.   MRN: [id number]  We attempted to have a call with Mr Volkov.  He stated he wanted to wait until this afternoon and I am not available then.  He then hung up on the nurse.    The nurse tried to call the patient back and he did not answer the phone.

## 2019-08-19 IMAGING — DX DG CHEST 1V PORT
1 series · 1 of 1 positions shown · non-contrast
Comparison: 01/05/2019 and older studies.

CLINICAL DATA: Respiratory failure.  Follow-up exam.

EXAM:
PORTABLE CHEST 1 VIEW

[chest ap]
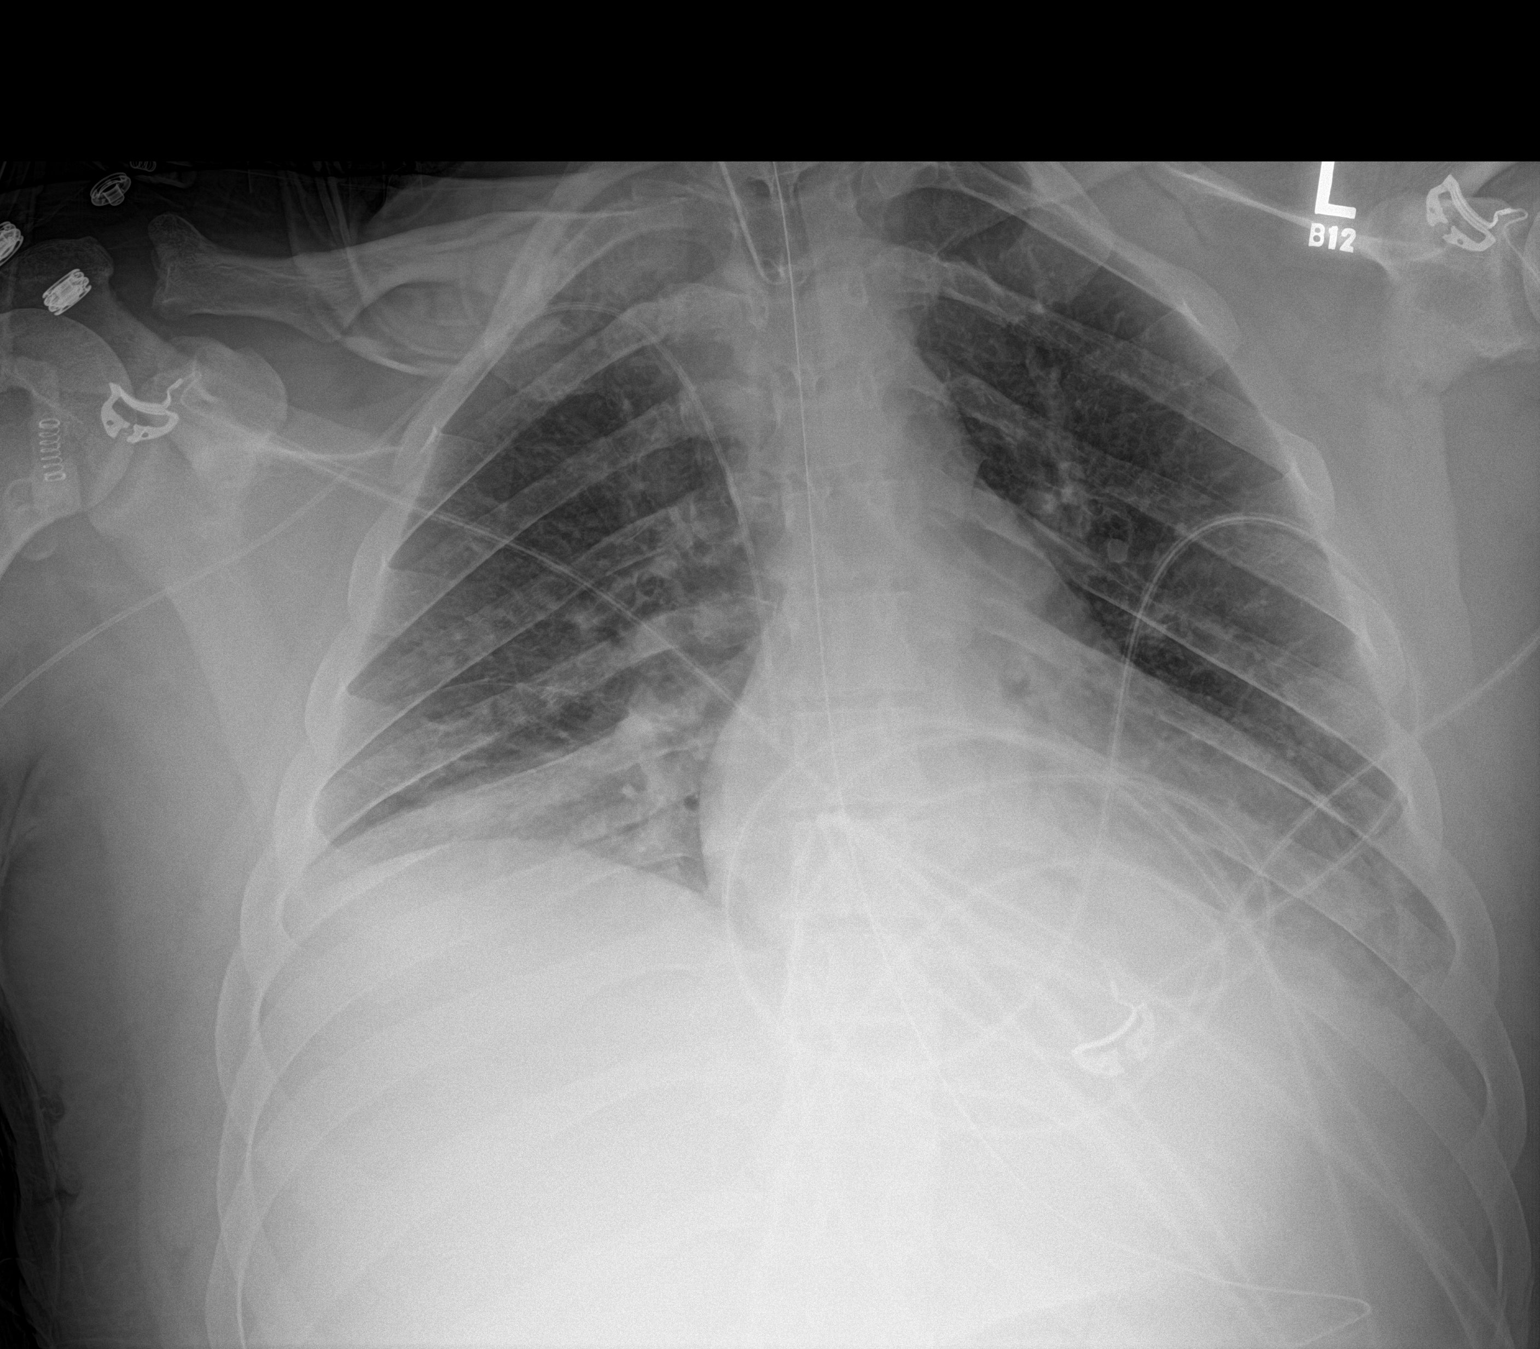

[1 of 1 positions shown; findings below may reference images not displayed]

FINDINGS: Lung base opacities noted on the prior study are similar, most
likely due to a combination of small effusions and atelectasis.
Pneumonia not excluded. Remainder of the lungs is clear with no
evidence of pulmonary edema.

No pneumothorax.

Endotracheal tube, nasal/orogastric tube and right PICC are stable.
IMPRESSION: 1. No significant change from the most recent prior exam.
2. Persistent lung base opacities most likely a combination of small
effusions and atelectasis. Pneumonia not excluded. No evidence of
pulmonary edema.
3. Support apparatus is stable.

## 2019-08-20 IMAGING — DX DG ABD PORTABLE 1V
1 series · 1 of 1 positions shown · non-contrast
Comparison: CT abdomen and pelvis 12/31/2018

CLINICAL DATA: OG tube placement.

EXAM:
PORTABLE ABDOMEN - 1 VIEW

[abdomen kub]
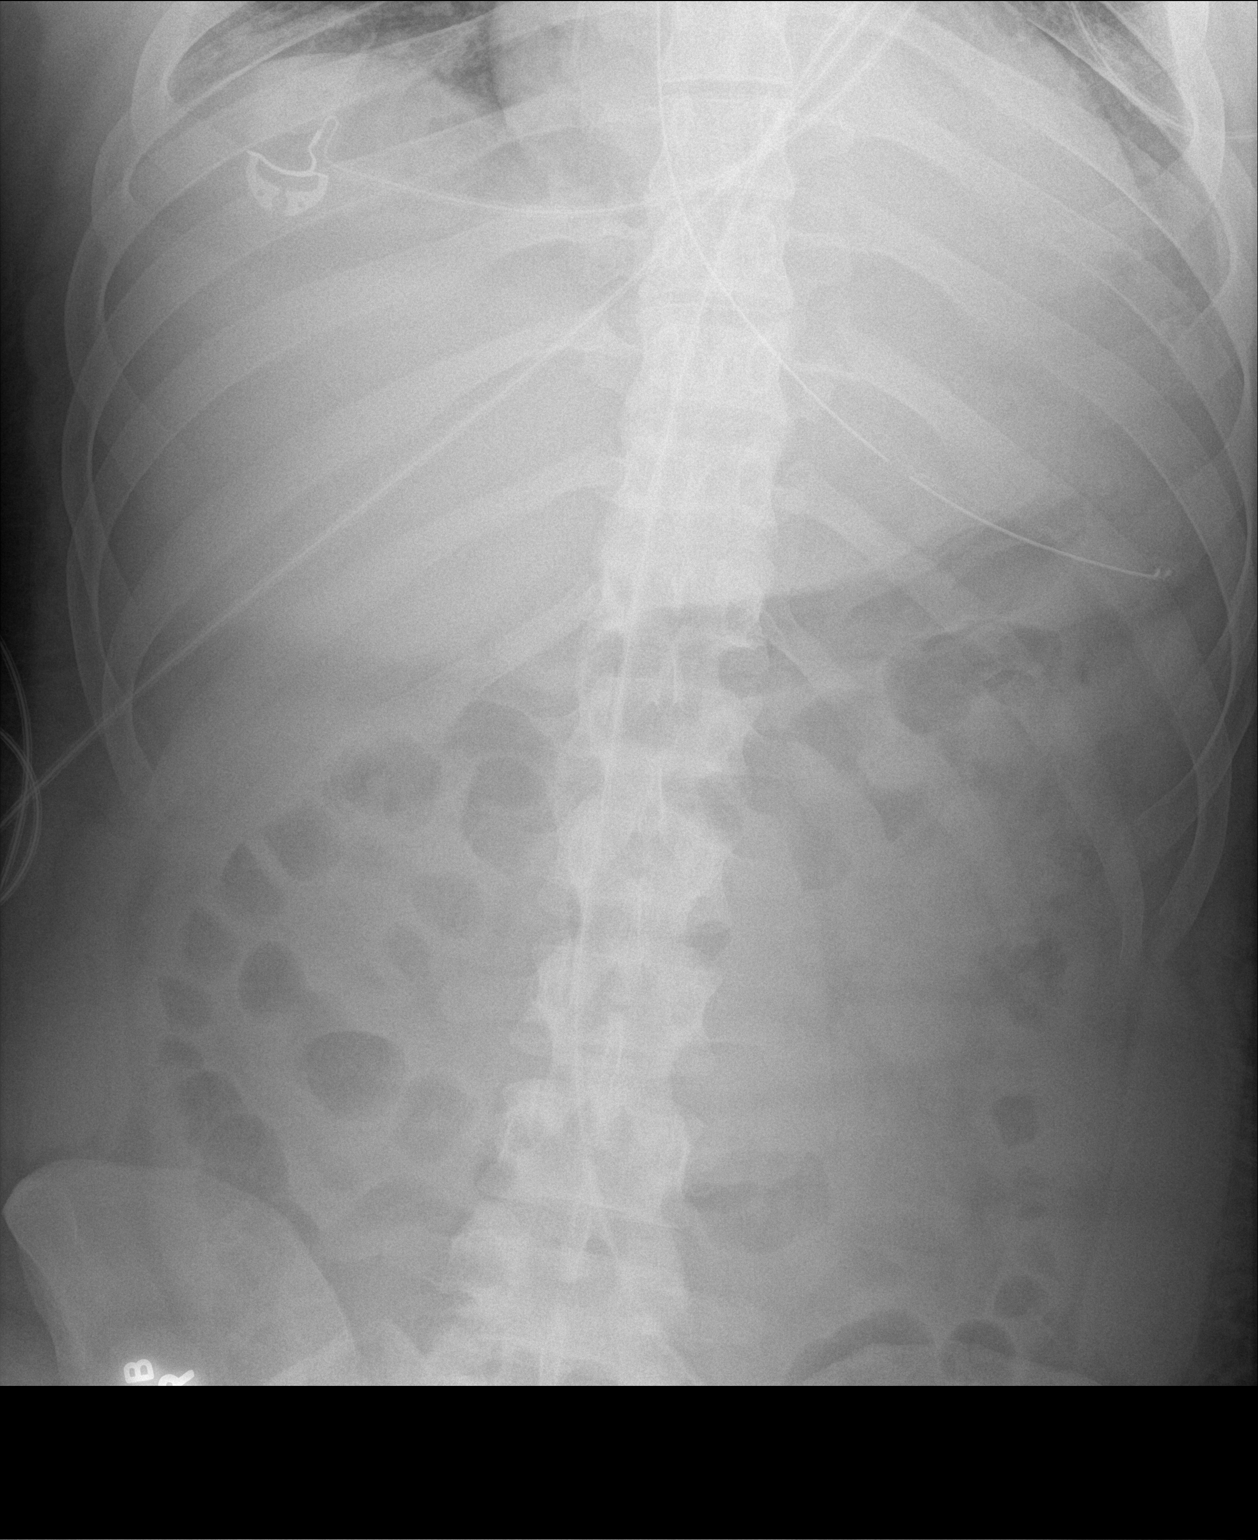

[1 of 1 positions shown; findings below may reference images not displayed]

FINDINGS: An enteric tube terminates in the left upper abdomen with tip
overlying the gastric body and side hole also below the GE junction.
Gas is present in nondilated loops of small and large bowel without
evidence of obstruction. No intraperitoneal free air is identified
on this supine study. No acute osseous abnormality is seen.
IMPRESSION: Enteric tube as above.

## 2019-08-24 IMAGING — DX DG CHEST 1V PORT
1 series · 1 of 1 positions shown · non-contrast
Comparison: Four days ago

CLINICAL DATA: Respiratory failure

EXAM:
PORTABLE CHEST 1 VIEW

[chest ap]
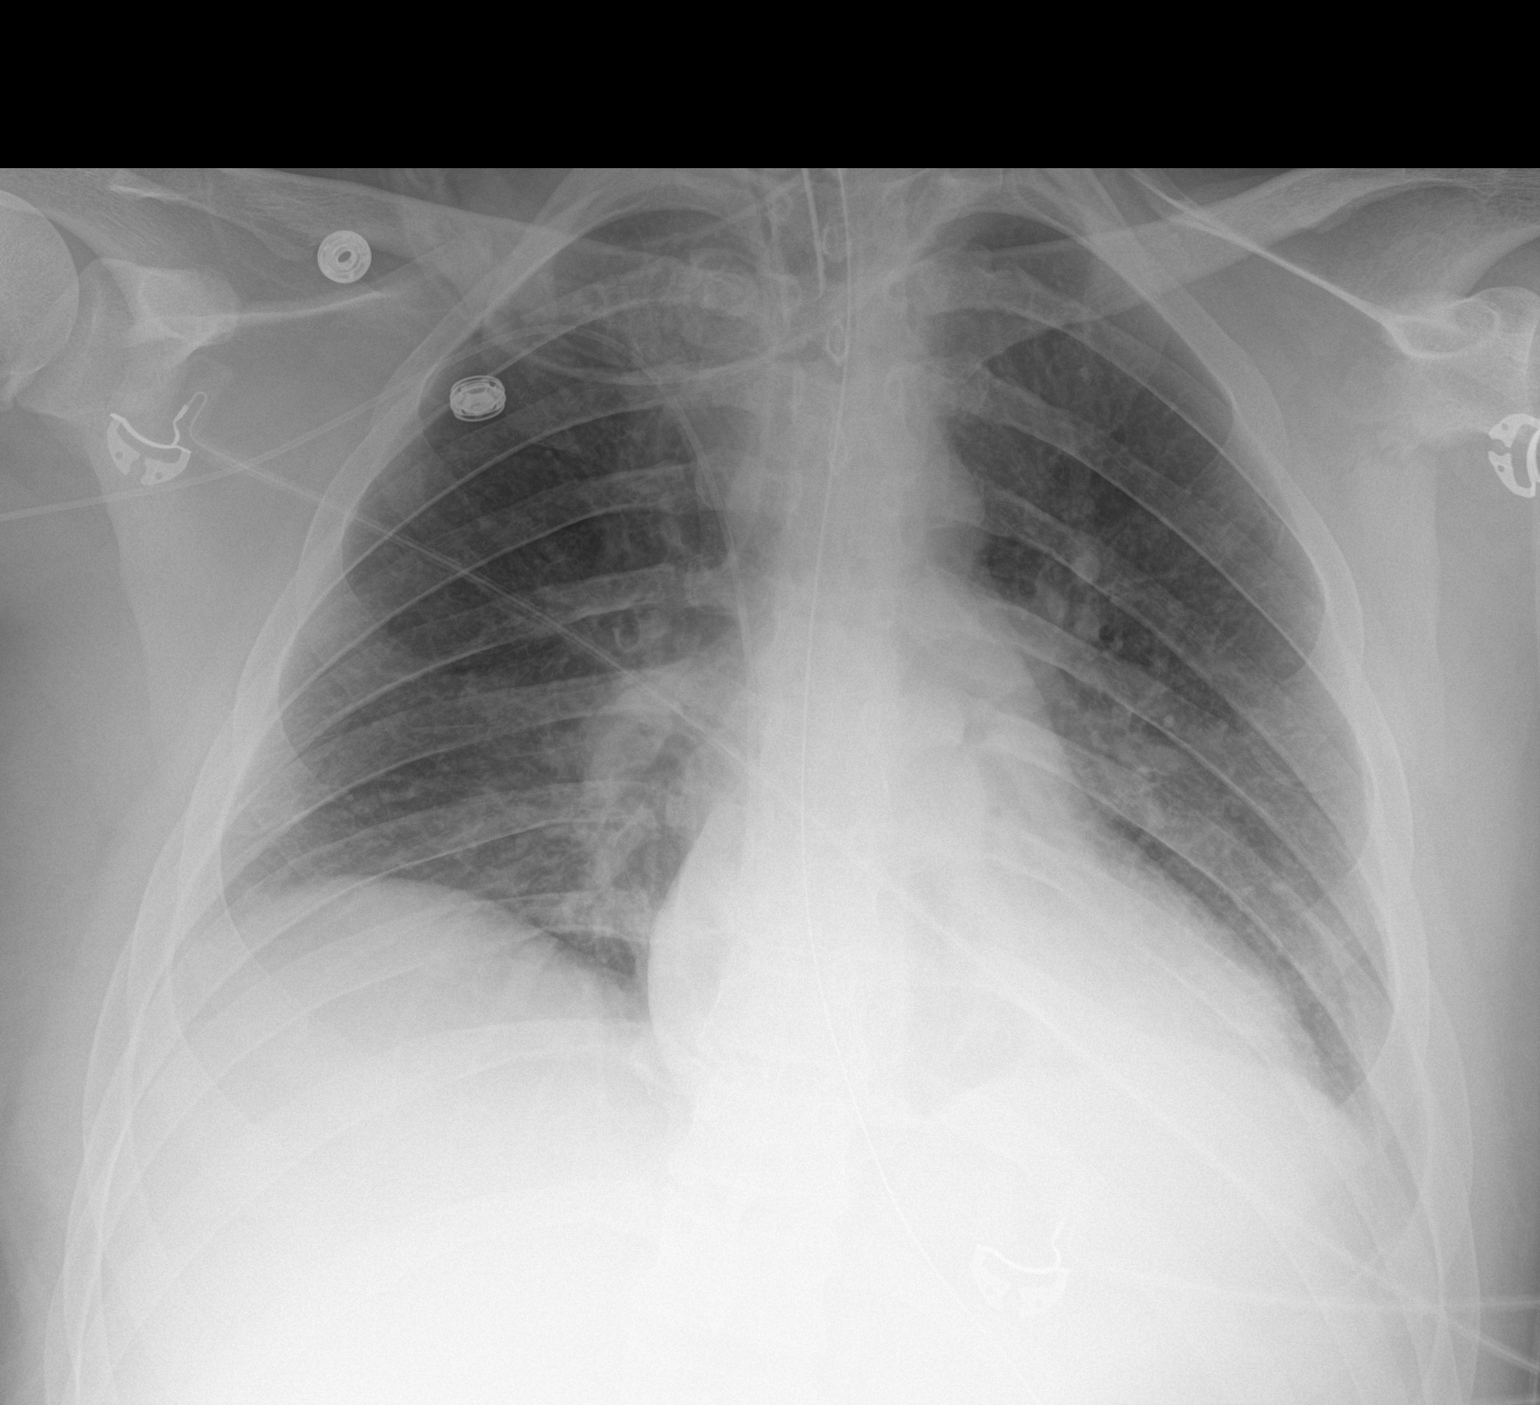

[1 of 1 positions shown; findings below may reference images not displayed]

FINDINGS: Endotracheal tube tip has been advanced with tip at the clavicular
heads. Right upper extremity PICC with tip at the upper right
atrium. An orogastric tube reaches the stomach.

Improved aeration at the bases with better defined diaphragm. Normal
heart size. No visible pneumothorax.
IMPRESSION: 1. Improved endotracheal tube positioning.
2. Improving lung inflation.
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
## 2019-08-31 ENCOUNTER — Telehealth: Payer: Self-pay | Admitting: General Practice

## 2019-08-31 NOTE — Telephone Encounter (Signed)
1) Medication(s) Requested (by name): -clonazePAM (KLONOPIN) 0.5 MG tablet   2) Pharmacy of Choice: -[id number] - [place], [place]

## 2019-08-31 NOTE — Telephone Encounter (Signed)
This rx was last written by a provider outside of this practice. I contacted pt concerning this. Per pt, Dr. Naaman Plummer instructed him that he'd have to receive this from his PCP. I let him know that we do not write for controlled substances but I would send this to Dr. Joya Gaskins.

## 2019-08-31 NOTE — Telephone Encounter (Signed)
Luke  This pt no showed for his post hosp visit  I cannot refill this rx   Dr Joya Gaskins
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
## 2020-06-27 ENCOUNTER — Other Ambulatory Visit: Payer: Self-pay | Admitting: Physician Assistant

## 2020-06-27 DIAGNOSIS — I615 Nontraumatic intracerebral hemorrhage, intraventricular: Secondary | ICD-10-CM

## 2020-08-23 ENCOUNTER — Emergency Department (HOSPITAL_COMMUNITY)
Admission: EM | Admit: 2020-08-23 | Discharge: 2020-08-23 | Discharge status: Against Medical Advice | Payer: Medicaid Other

## 2020-08-23 NOTE — ED Notes (Signed)
Pt called for triage, no answer x1 

## 2021-04-17 ENCOUNTER — Ambulatory Visit: Payer: Medicaid Other | Admitting: Physician Assistant

## 2021-04-17 ENCOUNTER — Other Ambulatory Visit: Payer: Self-pay

## 2021-04-17 VITALS — BP 124/89 | HR 83 | Temp 98.2°F | Resp 18 | Ht 68.0 in | Wt 269.0 lb

## 2021-04-17 DIAGNOSIS — F3132 Bipolar disorder, current episode depressed, moderate: Secondary | ICD-10-CM | POA: Diagnosis not present

## 2021-04-17 DIAGNOSIS — F4310 Post-traumatic stress disorder, unspecified: Secondary | ICD-10-CM

## 2021-04-17 DIAGNOSIS — F411 Generalized anxiety disorder: Secondary | ICD-10-CM

## 2021-04-17 DIAGNOSIS — Z6841 Body Mass Index (BMI) 40.0 and over, adult: Secondary | ICD-10-CM

## 2021-04-17 MED ORDER — HYDROXYZINE HCL 25 MG PO TABS: ORAL_TABLET | ORAL | 0 refills | Status: DC

## 2021-04-17 NOTE — Progress Notes (Signed)
New Patient Office Visit  Subjective:  Patient ID: Hunter Cook, male    DOB: 01/30/1996  Age: 25 y.o. MRN: [id number]  CC:  Chief Complaint  Patient presents with  . Anxiety    HPI Hunter CamperCody Lee Cook states that he has been having episodes of elevated anxiety.  Reports that he was being seen at [facility] for his bipolar disorder, states that he was recently restarted on Depakote and recently increased to 625 mg twice a day.  Reports that at first it did offer some relief but he feels it is no longer offering relief.  States that it is too far for them to travel to [facility].  Reports that his anxiety has been elevated due to increased stressors.  Reports he has previously been given Klonopin and has used Xanax in the past with some relief.  Reports that he has previously failed BuSpar.  Reports anxiety is worse during the day, states that it does lead to outbursts of anger.  Reports sleep is good.  Reports that he has upcoming appointment to establish care with a new primary care provider on April 26, 2021.  Note from last [facility] visit 01/03/21  Subjective:  Hunter Cook is a 25 y.o. male here for a health maintenance visit.  Patient is an established patient Additional concerns addressed today: see below.   HPI   Bipolar: Has been off Depakote for a several months. He has noted more issues with anger and short tempered. This has especially worsened over the last 3-4 months.    Plan:  2. Bipolar affective disorder, remission status unspecified (CMS-HCC) Restart Depakote with titration starting to 50 twice daily then increasing to 500 twice daily as maintenance. - divalproex (DEPAKOTE) 250 MG DR tablet; Take 1 tablet (250 mg total) by mouth 2 (two) times daily for 14 days Dispense: 28 tablet; Refill: 0 - divalproex (DEPAKOTE) 500 MG DR tablet; Take 1 tablet (500 mg total) by mouth 2 (two) times daily Start after 14 day 250 mg dose. Dispense: 60 tablet; Refill: 11     Past  Medical History:  Diagnosis Date  . Bipolar 1 disorder (HCC)   . Bipolar disorder (HCC)   . Snake bite     Past Surgical History:  Procedure Laterality Date  . FOOT SURGERY Right 2015   crush injury - 2 screws and 1 plate surgically placed  . FRACTURE SURGERY    . HARDWARE REMOVAL Left 01/14/2017   Procedure: Left Ankle Syndesmotic Screw Removal;  Surgeon: Eldred MangesMark C Yates, MD;  [facility];  Service: Orthopedics;  Laterality: Left;  . INCISION AND DRAINAGE Right 2012   snake bite to hand; had ~ 6 ORs related to this  . INCISION AND DRAINAGE  2012   Multiple procedures due to snake bite  . LAPAROTOMY N/A 12/20/2018   Procedure: EXPLORATORY LAPAROTOMY; SPLEENECTOMY;  Surgeon: Axel Filleramirez, Armando, MD;  [facility];  Service: General;  Laterality: N/A;  . ORIF ANKLE FRACTURE Left 10/22/2016   Procedure: OPEN REDUCTION INTERNAL FIXATION (ORIF) BIMALLEOLAR ANKLE FRACTURE;  Surgeon: Eldred MangesMark C Yates, MD;  Location: [facility] OR;  Service: Orthopedics;  Laterality: Left;  . ORIF ANKLE FRACTURE BIMALLEOLAR Left 10/22/2016  . ORIF ANKLE FRACTURE BIMALLEOLAR Left 2017  . ORIF ULNAR FRACTURE Left 12/22/2018   Procedure: OPEN REDUCTION INTERNAL FIXATION ULNAR FRACTURE;  Surgeon: Roby LoftsHaddix, Kevin P, MD;  Location: [facility] OR;  Service: Orthopedics;  Laterality: Left;  . SKIN GRAFT  2012   "related  to snake bite"  . SKIN GRAFT  2013   when bitten by copperhead snake  . TRACHEOSTOMY TUBE PLACEMENT N/A 01/13/2019   Procedure: TRACHEOSTOMY;  Surgeon: Violeta Gelinas, MD;  Location: [facility] OR;  Service: General;  Laterality: N/A;    Family History  Problem Relation Age of Onset  . Heart attack Father   . CAD Father     Social History   Socioeconomic History  . Marital status: Married    Spouse name: Not on file  . Number of children: Not on file  . Years of education: Not on file  . Highest education level: Not on file  Occupational History  . Not on file  Tobacco Use  . Smoking status: Current Every Day  Smoker    Packs/day: 0.50    Years: 8.00    Pack years: 4.00    Types: Cigarettes  . Smokeless tobacco: Never Used  . Tobacco comment: quit using chewing tobacco  Vaping Use  . Vaping Use: Former  Substance and Sexual Activity  . Alcohol use: Yes    Comment: (father unclear of true amt - "a lot" socially 3-4 times a week  . Drug use: Yes    Types: "Crack" cocaine, Marijuana    Comment: rarely  . Sexual activity: Yes  Other Topics Concern  . Not on file  Social History Narrative   ** Merged History Encounter **       Social Determinants of Health   Financial Resource Strain: Not on file  Food Insecurity: Not on file  Transportation Needs: Not on file  Physical Activity: Not on file  Stress: Not on file  Social Connections: Not on file  Intimate Partner Violence: Not on file    ROS Review of Systems  Constitutional: Negative for chills and fever.  HENT: Negative.   Eyes: Negative.   Respiratory: Negative for shortness of breath.   Cardiovascular: Negative for chest pain.  Genitourinary: Negative.   Musculoskeletal: Negative.   Skin: Negative.   Allergic/Immunologic: Negative.   Neurological: Negative.   Hematological: Negative.   Psychiatric/Behavioral: Positive for agitation and dysphoric mood. Negative for self-injury, sleep disturbance and suicidal ideas. The patient is nervous/anxious.     Objective:   Today's Vitals: BP 124/89 (BP Location: Left Arm, Patient Position: Sitting, Cuff Size: Large)   Pulse 83   Temp 98.2 F (36.8 C) (Oral)   Resp 18   Ht 5\' 8"  (1.727 m)   Wt 269 lb (122 kg)   SpO2 100%   BMI 40.90 kg/m   Physical Exam Vitals and nursing note reviewed.  Constitutional:      Appearance: Normal appearance.  HENT:     Head: Normocephalic and atraumatic.     Right Ear: External ear normal.     Left Ear: External ear normal.     Nose: Nose normal.     Mouth/Throat:     Mouth: Mucous membranes are moist.     Pharynx: Oropharynx is  clear.  Eyes:     Extraocular Movements: Extraocular movements intact.     Conjunctiva/sclera: Conjunctivae normal.     Pupils: Pupils are equal, round, and reactive to light.  Cardiovascular:     Rate and Rhythm: Normal rate and regular rhythm.     Pulses: Normal pulses.     Heart sounds: Normal heart sounds.  Pulmonary:     Effort: Pulmonary effort is normal.     Breath sounds: Normal breath sounds.  Musculoskeletal:  General: Normal range of motion.     Cervical back: Normal range of motion and neck supple.  Skin:    General: Skin is warm.  Neurological:     General: No focal deficit present.     Mental Status: He is alert and oriented to person, place, and time.  Psychiatric:        Attention and Perception: Attention normal.        Mood and Affect: Mood is anxious.        Speech: Speech normal.        Behavior: Behavior normal.        Thought Content: Thought content normal.        Cognition and Memory: Cognition and memory normal.        Judgment: Judgment normal.     Assessment & Plan:   Problem List Items Addressed This Visit   None   Visit Diagnoses    Bipolar affective disorder, currently depressed, moderate (HCC)    -  Primary   Relevant Medications   hydrOXYzine (ATARAX/VISTARIL) 25 MG tablet   Other Relevant Orders   Ambulatory referral to Psychiatry   CBC with Differential/Platelet   Comp. Metabolic Panel (12)   Valproic acid level   Anxiety state       Relevant Medications   hydrOXYzine (ATARAX/VISTARIL) 25 MG tablet   Other Relevant Orders   TSH   Posttraumatic stress disorder       Relevant Medications   hydrOXYzine (ATARAX/VISTARIL) 25 MG tablet   Class 3 severe obesity due to excess calories with body mass index (BMI) of 40.0 to 44.9 in adult, unspecified whether serious comorbidity present [facility])          Outpatient Encounter Medications as of 04/17/2021  Medication Sig  . acetaminophen (TYLENOL) 325 MG tablet Take 2 tablets (650 mg  total) by mouth every 6 (six) hours as needed for mild pain or fever (Fever >101.5).  Marland Kitchen divalproex (DEPAKOTE) 125 MG DR tablet Take 1 tablet by mouth every morning.  . divalproex (DEPAKOTE) 500 MG DR tablet Take 1 tablet by mouth at bedtime.  . hydrOXYzine (ATARAX/VISTARIL) 25 MG tablet Take 1/2- 2 full tabs PO q8hrs for anxiety  . [DISCONTINUED] divalproex (DEPAKOTE) 125 MG DR tablet Take 125 mg by mouth 3 (three) times daily.  Marland Kitchen ALPRAZolam (XANAX) 1 MG tablet Take 1 mg by mouth 2 (two) times daily as needed for anxiety. Fathers Medication (Patient not taking: Reported on 04/17/2021)  . clonazePAM (KLONOPIN) 0.5 MG tablet Take 1 tablet (0.5 mg total) by mouth 2 (two) times daily. (Patient not taking: Reported on 04/17/2021)  . gabapentin (NEURONTIN) 300 MG capsule Take 1 capsule (300 mg total) by mouth 3 (three) times daily. (Patient not taking: No sig reported)  . pantoprazole (PROTONIX) 40 MG tablet Take 1 tablet (40 mg total) by mouth daily. (Patient not taking: No sig reported)  . propranolol (INDERAL) 40 MG tablet Take 1 tablet (40 mg total) by mouth 3 (three) times daily. (Patient not taking: No sig reported)  . QUEtiapine (SEROQUEL) 25 MG tablet Take 1 tablet (25 mg total) by mouth at bedtime. (Patient not taking: No sig reported)  . [DISCONTINUED] Oxycodone HCl 10 MG TABS Take 1 tablet (10 mg total) by mouth every 6 (six) hours. (Patient not taking: Reported on 05/13/2019)  . [DISCONTINUED] Rivaroxaban 15 & 20 MG TBPK Take as directed on package: Start with one 15mg  tablet by mouth twice a day with food. On Day  22, switch to one 20mg  tablet once a day with food.  . [DISCONTINUED] valproic acid (DEPAKENE) 250 MG capsule Take 2 capsules (500 mg total) by mouth 2 (two) times daily. (Patient not taking: Reported on 06/12/2019)   No facility-administered encounter medications on file as of 04/17/2021.   1. Bipolar affective disorder, currently depressed, moderate (HCC) Referral to psychiatry for  medication management, patient states that he has contact for CBT and plans to follow-up after this visit.   - Ambulatory referral to Psychiatry - CBC with Differential/Platelet - Comp. Metabolic Panel (12) - Valproic acid level  2. Anxiety state Trial hydroxyzine 12.5 to 50 mg every 8 hours as needed - TSH - hydrOXYzine (ATARAX/VISTARIL) 25 MG tablet; Take 1/2- 2 full tabs PO q8hrs for anxiety  Dispense: 60 tablet; Refill: 0  3. Posttraumatic stress disorder   4. Class 3 severe obesity due to excess calories with body mass index (BMI) of 40.0 to 44.9 in adult, unspecified whether serious comorbidity present (HCC)    I have reviewed the patient's medical history (PMH, PSH, Social History, Family History, Medications, and allergies) , and have been updated if relevant. I spent 32 minutes reviewing chart and  face to face time with patient.     Follow-up: Return if symptoms worsen or fail to improve.   04/19/2021 [place], PA-C

## 2021-04-17 NOTE — Progress Notes (Signed)
Patient has taken Depakote today and patient has eaten today. Patient denies pain at this time. Patient reports having bi polar and being out of his regimen due to know PCP to manage and prescribe. Patient is having his first child soon and has noticed his anxiety levels rising.

## 2021-04-17 NOTE — Patient Instructions (Signed)
For your anxiety, I would like you to take 12.5 mg up to 50 mg every 8 hours, I have started a referral for you to be seen by psychiatry to help manage your medications.  We will call you with your lab results.  Kennieth Rad, PA-C Physician Assistant [facility] [email or web address]    Textbook of family medicine (9th ed., pp. [phone]). [place], [place]: [employer].">  Stress, Adult Stress is a normal reaction to life events. Stress is what you feel when life demands more than you are used to, or more than you think you can handle. Some stress can be useful, such as studying for a test or meeting a deadline at work. Stress that occurs too often or for too long can cause problems. It can affect your emotional health and interfere with relationships and normal daily activities. Too much stress can weaken your body's defense system (immune system) and increase your risk for physical illness. If you already have a medical problem, stress can make it worse. What are the causes? All sorts of life events can cause stress. An event that causes stress for one person may not be stressful for another person. Major life events, whether positive or negative, commonly cause stress. Examples include:  Losing a job or starting a new job.  Losing a loved one.  Moving to a new town or home.  Getting married or divorced.  Having a baby.  Getting injured or sick. Less obvious life events can also cause stress, especially if they occur day after day or in combination with each other. Examples include:  Working long hours.  Driving in traffic.  Caring for children.  Being in debt.  Being in a difficult relationship. What are the signs or symptoms? Stress can cause emotional symptoms, including:  Anxiety. This is feeling worried, afraid, on edge, overwhelmed, or out of control.  Anger, including irritation or impatience.  Depression. This  is feeling sad, down, helpless, or guilty.  Trouble focusing, remembering, or making decisions. Stress can cause physical symptoms, including:  Aches and pains. These may affect your head, neck, back, stomach, or other areas of your body.  Tight muscles or a clenched jaw.  Low energy.  Trouble sleeping. Stress can cause unhealthy behaviors, including:  Eating to feel better (overeating) or skipping meals.  Working too much or putting off tasks.  Smoking, drinking alcohol, or using drugs to feel better. How is this diagnosed? Stress is diagnosed through an assessment by your health care provider. He or she may diagnose this condition based on:  Your symptoms and any stressful life events.  Your medical history.  Tests to rule out other causes of your symptoms. Depending on your condition, your health care provider may refer you to a specialist for further evaluation. How is this treated? Stress management techniques are the recommended treatment for stress. Medicine is not typically recommended for the treatment of stress. Techniques to reduce your reaction to stressful life events include:  Stress identification. Monitor yourself for symptoms of stress and identify what causes stress for you. These skills may help you to avoid or prepare for stressful events.  Time management. Set your priorities, keep a calendar of events, and learn to say no. Taking these actions can help you avoid making too many commitments. Techniques for coping with stress include:  Rethinking the problem. Try to think realistically about stressful events rather than ignoring them or overreacting. Try to find the positives in a stressful situation rather  than focusing on the negatives.  Exercise. Physical exercise can release both physical and emotional tension. The key is to find a form of exercise that you enjoy and do it regularly.  Relaxation techniques. These relax the body and mind. The key is to  find one or more that you enjoy and use the techniques regularly. Examples include: ? Meditation, deep breathing, or progressive relaxation techniques. ? Yoga or tai chi. ? Biofeedback, mindfulness techniques, or journaling. ? Listening to music, being out in nature, or participating in other hobbies.  Practicing a healthy lifestyle. Eat a balanced diet, drink plenty of water, limit or avoid caffeine, and get plenty of sleep.  Having a strong support network. Spend time with family, friends, or other people you enjoy being around. Express your feelings and talk things over with someone you trust. Counseling or talk therapy with a mental health professional may be helpful if you are having trouble managing stress on your own.   Follow these instructions at home: Lifestyle  Avoid drugs.  Do not use any products that contain nicotine or tobacco, such as cigarettes, e-cigarettes, and chewing tobacco. If you need help quitting, ask your health care provider.  Limit alcohol intake to no more than 1 drink a day for nonpregnant women and 2 drinks a day for men. One drink equals 12 oz of beer, 5 oz of wine, or 1 oz of hard liquor  Do not use alcohol or drugs to relax.  Eat a balanced diet that includes fresh fruits and vegetables, whole grains, lean meats, fish, eggs, and beans, and low-fat dairy. Avoid processed foods and foods high in added fat, sugar, and salt.  Exercise at least 30 minutes on 5 or more days each week.  Get 7-8 hours of sleep each night.   General instructions  Practice stress management techniques as discussed with your health care provider.  Drink enough fluid to keep your urine clear or pale yellow.  Take over-the-counter and prescription medicines only as told by your health care provider.  Keep all follow-up visits as told by your health care provider. This is important.   Contact a health care provider if:  Your symptoms get worse.  You have new  symptoms.  You feel overwhelmed by your problems and can no longer manage them on your own. Get help right away if:  You have thoughts of hurting yourself or others. If you ever feel like you may hurt yourself or others, or have thoughts about taking your own life, get help right away. You can go to your nearest emergency department or call:  Your local emergency services (911 in the [place]  A suicide crisis helpline, such as the [place] at 409-825-2459. This is open 24 hours a day. Summary  Stress is a normal reaction to life events. It can cause problems if it happens too often or for too long.  Practicing stress management techniques is the best way to treat stress.  Counseling or talk therapy with a mental health professional may be helpful if you are having trouble managing stress on your own. This information is not intended to replace advice given to you by your health care provider. Make sure you discuss any questions you have with your health care provider. Document Revised: 07/29/2020 Document Reviewed: 07/29/2020 Elsevier Patient Education  2021 [employer].

## 2021-04-18 DIAGNOSIS — F411 Generalized anxiety disorder: Secondary | ICD-10-CM | POA: Insufficient documentation

## 2021-04-18 DIAGNOSIS — F3132 Bipolar disorder, current episode depressed, moderate: Secondary | ICD-10-CM | POA: Insufficient documentation

## 2021-04-18 DIAGNOSIS — F4310 Post-traumatic stress disorder, unspecified: Secondary | ICD-10-CM | POA: Insufficient documentation

## 2021-04-18 DIAGNOSIS — F319 Bipolar disorder, unspecified: Secondary | ICD-10-CM | POA: Insufficient documentation

## 2021-04-19 LAB — CBC WITH DIFFERENTIAL/PLATELET
Basophils Absolute: 0.1 10*3/uL (ref 0.0–0.2)
Basos: 1 %
EOS (ABSOLUTE): 0.1 10*3/uL (ref 0.0–0.4)
Eos: 1 %
Hematocrit: 47.2 % (ref 37.5–51.0)
Hemoglobin: 16.1 g/dL (ref 13.0–17.7)
Immature Grans (Abs): 0 10*3/uL (ref 0.0–0.1)
Immature Granulocytes: 0 %
Lymphocytes Absolute: 4.4 10*3/uL — ABNORMAL HIGH (ref 0.7–3.1)
Lymphs: 40 %
MCH: 30.6 pg (ref 26.6–33.0)
MCHC: 34.1 g/dL (ref 31.5–35.7)
MCV: 90 fL (ref 79–97)
Monocytes Absolute: 0.7 10*3/uL (ref 0.1–0.9)
Monocytes: 6 %
Neutrophils Absolute: 5.7 10*3/uL (ref 1.4–7.0)
Neutrophils: 52 %
Platelets: 452 10*3/uL — ABNORMAL HIGH (ref 150–450)
RBC: 5.27 x10E6/uL (ref 4.14–5.80)
RDW: 13.1 % (ref 11.6–15.4)
WBC: 11 10*3/uL — ABNORMAL HIGH (ref 3.4–10.8)

## 2021-04-19 LAB — COMP. METABOLIC PANEL (12)
AST: 19 IU/L (ref 0–40)
Albumin/Globulin Ratio: 1.9 (ref 1.2–2.2)
Albumin: 5 g/dL (ref 4.1–5.2)
Alkaline Phosphatase: 85 IU/L (ref 44–121)
BUN/Creatinine Ratio: 21 — ABNORMAL HIGH (ref 9–20)
BUN: 17 mg/dL (ref 6–20)
Bilirubin Total: 0.3 mg/dL (ref 0.0–1.2)
Calcium: 10.2 mg/dL (ref 8.7–10.2)
Chloride: 98 mmol/L (ref 96–106)
Creatinine, Ser: 0.81 mg/dL (ref 0.76–1.27)
Globulin, Total: 2.7 g/dL (ref 1.5–4.5)
Glucose: 80 mg/dL (ref 65–99)
Potassium: 5.1 mmol/L (ref 3.5–5.2)
Sodium: 138 mmol/L (ref 134–144)
Total Protein: 7.7 g/dL (ref 6.0–8.5)
eGFR: 124 mL/min/{1.73_m2} (ref >59)

## 2021-04-19 LAB — TSH: TSH: 1.69 u[IU]/mL (ref 0.450–4.500)

## 2021-04-19 LAB — VALPROIC ACID LEVEL: Valproic Acid Lvl: 70 ug/mL (ref 50–100)

## 2021-04-20 ENCOUNTER — Telehealth: Payer: Self-pay | Admitting: *Deleted

## 2021-04-20 NOTE — Telephone Encounter (Signed)
Patient verified DOB Patient is aware of labs being normal and to FU with PCP on June 1st. No further questions.

## 2021-04-20 NOTE — Telephone Encounter (Signed)
-----   Message from Roney Jaffe, [name] sent at 04/20/2021  9:38 AM EDT ----- Please call patient and let him know that his thyroid function, kidney and liver function are within normal limits.  He does not show signs of anemia.  His lithium levels are within normal limits.  Encouraged to continue follow-up with new primary care provider on June 1

## 2021-04-26 ENCOUNTER — Other Ambulatory Visit: Payer: Self-pay

## 2021-04-26 ENCOUNTER — Ambulatory Visit (INDEPENDENT_AMBULATORY_CARE_PROVIDER_SITE_OTHER): Payer: Medicaid Other | Admitting: Nurse Practitioner

## 2021-04-26 ENCOUNTER — Encounter: Payer: Self-pay | Admitting: Nurse Practitioner

## 2021-04-26 VITALS — BP 122/76 | HR 86 | Temp 99.0°F | Ht 68.0 in | Wt 272.9 lb

## 2021-04-26 DIAGNOSIS — Z7689 Persons encountering health services in other specified circumstances: Secondary | ICD-10-CM | POA: Diagnosis not present

## 2021-04-26 DIAGNOSIS — F4310 Post-traumatic stress disorder, unspecified: Secondary | ICD-10-CM | POA: Diagnosis not present

## 2021-04-26 DIAGNOSIS — F3132 Bipolar disorder, current episode depressed, moderate: Secondary | ICD-10-CM

## 2021-04-26 DIAGNOSIS — F411 Generalized anxiety disorder: Secondary | ICD-10-CM

## 2021-04-26 MED ORDER — HYDROXYZINE HCL 25 MG PO TABS: ORAL_TABLET | ORAL | 1 refills | Status: DC

## 2021-04-26 MED ORDER — ESCITALOPRAM OXALATE 5 MG PO TABS
5.0000 mg | ORAL_TABLET | Freq: Every day | ORAL | 1 refills | Status: DC
Start: 2021-04-26 — End: 2021-06-09

## 2021-04-26 MED ORDER — PRAZOSIN HCL 1 MG PO CAPS: 1.0000 mg | ORAL_CAPSULE | Freq: Every day | ORAL | 1 refills | Status: DC
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
## 2021-04-26 NOTE — Patient Instructions (Signed)
Managing Post-Traumatic Stress Disorder If you have been diagnosed with post-traumatic stress disorder (PTSD), you may be relieved that you now know why you have felt or behaved a certain way. Still, you may feel overwhelmed about the treatment ahead. You may also wonder how to get the support you need and how to deal with the condition day-to-day. If you are living with PTSD, there are ways to help you recover from it and manage your symptoms. How to manage lifestyle changes Managing stress Stress is your body's reaction to life changes and events, both good and bad. Stress can make PTSD worse. Take the following steps to manage stress:  Talk with your health care provider or a counselor if you would like to learn more about techniques to reduce your stress. He or she may suggest some stress reduction techniques such as: ? Muscle relaxation exercises. ? Regular exercise. ? Meditation, yoga, or other mind-body exercises. ? Breathing exercises. ? Listening to quiet music. ? Spending time outside.  Maintain a healthy lifestyle. Eat a healthy diet, exercise regularly, get plenty of sleep, and take time to relax.  Spend time with others. Talk with them about how you are feeling and what kind of support you need. Try not to isolate yourself, even though you may feel like doing that. Isolating yourself can delay your recovery.  Do activities and hobbies that you enjoy.  Pace yourself when doing stressful things. Take breaks, and reward yourself when you finish. Make sure that you do not overload your schedule.   Medicines Your health care provider may suggest certain medicines if he or she feels that they will help to improve your condition. Medicines for depression (antidepressants) or severe loss of contact with reality (antipsychotics) may be used to treat PTSD. Avoid using alcohol and other substances that may prevent your medicines from working properly. It is also important to:  Talk with  your pharmacist or health care provider about all medicines that you take, their possible side effects, and which medicines are safe to take together.  Make it your goal to take part in all treatment decisions (shared decision-making). Ask about possible side effects of medicines that your health care provider recommends, and tell him or her how you feel about having those side effects. It is best if shared decision-making with your health care provider is part of your total treatment plan. If your health care provider prescribes a medicine, you may not notice the full benefits of it for 4-8 weeks. Most people who are treated for PTSD need to take medicine for at least 6-12 months before they feel better. If you are taking medicines as part of your treatment, do not stop taking medicines before you ask your health care provider if it is safe to stop. You may need to have the medicine slowly decreased (tapered) over time to lower the risk of harmful side effects. Relationships Many people who have PTSD have difficulty trusting others. Make an effort to:  Take risks and develop trust with close friends and family members. Developing trust in others can help you feel safe and connect you with emotional support.  Be open and honest about your feelings.  Have fun and relax in safe spaces, such as with friends and family.  Think about going to couples counseling, family education classes, or family therapy. Your loved ones may not always know how to be supportive. Therapy can be helpful for everyone. How to recognize changes in your condition Be aware of   your symptoms and how often you have them. The following symptoms mean that you need to seek help for your PTSD:  You feel suspicious and angry.  You have repeated flashbacks.  You avoid going out or being with others.  You have an increasing number of fights with close friends or family members, such as your spouse.  You have thoughts about  hurting yourself or others.  You cannot get relief from feelings of depression or anxiety. Follow these instructions at home: Lifestyle  Exercise regularly. Try to do 30 or more minutes of physical activity on most days of the week.  Try to get 7-9 hours of sleep each night. To help with sleep: ? Keep your bedroom cool and dark. ? Avoid screen time before bedtime. This means avoiding use of your TV, computer, tablet, and cell phone.  Practice self-soothing skills and use them daily.  Try to have fun and seek humor in your life. Eating and drinking  Do not eat a heavy meal during the hour before you go to bed.  Do not drink alcohol or caffeinated drinks before bed.  Avoid using alcohol or drugs. General instructions  If your PTSD is affecting your marriage or family, seek help from a family therapist.  Remind yourself that recovering from the trauma is a process and takes time.  Take over-the-counter and prescription medicines only as told by your health care provider.  Make sure to let all of your health care providers know that you have PTSD. This is especially important if you are having surgery or need to be admitted to the hospital.  Keep all follow-up visits as told by your health care providers. This is important. Where to find support Talking to others  Explain that PTSD is a mental health problem. It is something that a person can develop after experiencing or seeing a life-threatening event. Tell them that PTSD makes you feel stress like you did during the event.  Talk to your loved ones about the symptoms you have. Also tell them what things or situations can cause symptoms to start (are triggers for you).  Assure your loved ones that there are treatments to help PTSD. Discuss possibly seeking family therapy or couples therapy.  If you are worried or fearful about seeking treatment, ask for support.  Keep daily contact with at least one trusted friend or family  member. Finances Not all insurance plans cover mental health care, so it is important to check with your insurance carrier. If paying for co-pays or counseling services is a problem, search for a local or county mental health care center. Public mental health care services may be offered there at a low cost or no cost when you are not able to see a private health care provider. If you are a veteran, contact a local veterans organization or veterans hospital for more information. If you are taking medicine for PTSD, you may be able to get the genericform, which may be less expensive than brand-name medicine. Some makers of prescription medicines also offer help to patients who cannot afford the medicines that they need. Therapy and support groups  Find a support group in your community. Often, groups are available for military veterans, trauma victims, and family members or caregivers.  Look into volunteer opportunities. Taking part in these can help you feel more connected to your community.  Contact a local organization to find out if you are eligible for a service dog. Where to find more information Go   to this website to find more information about PTSD, treatment of PTSD, and how to get support:  National Center for PTSD: www.ptsd.va.gov Contact a health care provider if:  Your symptoms get worse or do not get better. Get help right away if:  You have thoughts about hurting yourself or others. If you ever feel like you may hurt yourself or others, or have thoughts about taking your own life, get help right away. You can go to your nearest emergency department or call:  Your local emergency services (911 in the [place]  A suicide crisis helpline, such as the National Suicide Prevention Lifeline at 1-800-273-8255. This is open 24-hours a day. Summary  If you are living with PTSD, there are ways to help you recover from it and manage your symptoms.  Find supportive environments and  people who understand PTSD. Spend time in those places, and maintain contact with those people.  Work with your health care team to create a plan for managing PTSD. The plan should include counseling, stress reduction techniques, and healthy lifestyle habits. This information is not intended to replace advice given to you by your health care provider. Make sure you discuss any questions you have with your health care provider. Document Revised: 07/29/2020 Document Reviewed: 07/29/2020 Elsevier Patient Education  2021 [employer]  

## 2021-06-09 ENCOUNTER — Other Ambulatory Visit: Payer: Self-pay

## 2021-06-09 ENCOUNTER — Ambulatory Visit (INDEPENDENT_AMBULATORY_CARE_PROVIDER_SITE_OTHER): Payer: Medicare Other | Admitting: Physician Assistant

## 2021-06-09 ENCOUNTER — Encounter (HOSPITAL_COMMUNITY): Payer: Self-pay | Admitting: Physician Assistant

## 2021-06-09 VITALS — BP 133/77 | HR 57 | Ht 68.0 in | Wt 274.0 lb

## 2021-06-09 DIAGNOSIS — F411 Generalized anxiety disorder: Secondary | ICD-10-CM

## 2021-06-09 DIAGNOSIS — F4310 Post-traumatic stress disorder, unspecified: Secondary | ICD-10-CM | POA: Diagnosis not present

## 2021-06-09 DIAGNOSIS — F3132 Bipolar disorder, current episode depressed, moderate: Secondary | ICD-10-CM | POA: Diagnosis not present

## 2021-06-09 MED ORDER — CLONAZEPAM 0.5 MG PO TABS: 0.5000 mg | ORAL_TABLET | Freq: Two times a day (BID) | ORAL | 0 refills | Status: DC

## 2021-06-09 MED ORDER — PRAZOSIN HCL 1 MG PO CAPS: 1.0000 mg | ORAL_CAPSULE | Freq: Every day | ORAL | 0 refills | Status: DC

## 2021-06-09 MED ORDER — DIVALPROEX SODIUM 500 MG PO DR TAB: 500.0000 mg | DELAYED_RELEASE_TABLET | Freq: Two times a day (BID) | ORAL | 0 refills | Status: DC

## 2021-06-09 MED ORDER — ESCITALOPRAM OXALATE 20 MG PO TABS: 20.0000 mg | ORAL_TABLET | Freq: Every day | ORAL | 0 refills | Status: DC
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
## 2021-06-15 ENCOUNTER — Encounter (HOSPITAL_COMMUNITY): Payer: Self-pay | Admitting: Emergency Medicine

## 2021-06-15 ENCOUNTER — Ambulatory Visit (HOSPITAL_COMMUNITY)
Admission: EM | Admit: 2021-06-15 | Discharge: 2021-06-15 | Disposition: A | Discharge status: Home / Self Care | Payer: Medicare Other | Attending: Student | Admitting: Student

## 2021-06-15 ENCOUNTER — Telehealth: Payer: Self-pay | Admitting: Nurse Practitioner

## 2021-06-15 ENCOUNTER — Other Ambulatory Visit: Payer: Self-pay

## 2021-06-15 ENCOUNTER — Emergency Department (HOSPITAL_COMMUNITY)
Admission: EM | Admit: 2021-06-15 | Discharge: 2021-06-15 | Discharge status: Against Medical Advice | Payer: Medicare Other

## 2021-06-15 DIAGNOSIS — J020 Streptococcal pharyngitis: Secondary | ICD-10-CM | POA: Diagnosis not present

## 2021-06-15 LAB — POCT RAPID STREP A, ED / UC: Streptococcus, Group A Screen (Direct): POSITIVE — AB

## 2021-06-15 MED ORDER — AMOXICILLIN 500 MG PO CAPS: 500.0000 mg | ORAL_CAPSULE | Freq: Two times a day (BID) | ORAL | 0 refills | Status: AC

## 2021-06-15 NOTE — ED Notes (Signed)
The patient left without being triaged

## 2021-06-15 NOTE — ED Triage Notes (Signed)
Patient complains of swollen uvula that woke patient this morning, states making it hard to breathe and hard to swallow.

## 2021-06-15 NOTE — Telephone Encounter (Signed)
Patient states his uvula was extremely swollen and was laying on his tongue and he is having difficulty breathing and swallowing.   I advised patient to go to the ED for evaluation and treatment. Patient verbalized understanding. Hunter Cook is aware and agreed with advise given. AS, CMA
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
## 2021-06-15 NOTE — Telephone Encounter (Signed)
Patient states his uvula is swollen and feels as if there is something caught it in his throat. There is something or an object siting on his tongue.

## 2021-06-15 NOTE — Discharge Instructions (Addendum)
-  Start the antibiotic-Amoxicillin, 1 pill every 12 hours for 10 days.  You can take this with food like with breakfast and dinner. -For discomfort- Take Tylenol 1000 mg 3 times daily, and ibuprofen 800 mg 3 times daily with food.  You can take these together, or alternate every 3-4 hours. -You'll still be contagious for 24 hours after starting the antibiotic -Throw out your toothbrush after 24 hours -Seek additional medical attention if symptoms get worse instead of better, like trouble swallowing, shortness of breath, facial swelling, dizziness, etc.

## 2021-07-12 ENCOUNTER — Ambulatory Visit (INDEPENDENT_AMBULATORY_CARE_PROVIDER_SITE_OTHER): Payer: Medicare Other | Admitting: Nurse Practitioner

## 2021-07-12 ENCOUNTER — Encounter: Payer: Self-pay | Admitting: Nurse Practitioner

## 2021-07-12 ENCOUNTER — Other Ambulatory Visit: Payer: Self-pay

## 2021-07-12 VITALS — BP 123/75 | HR 87 | Temp 97.7°F | Ht 68.0 in | Wt 276.0 lb

## 2021-07-12 DIAGNOSIS — F411 Generalized anxiety disorder: Secondary | ICD-10-CM | POA: Diagnosis not present

## 2021-07-12 DIAGNOSIS — Z6841 Body Mass Index (BMI) 40.0 and over, adult: Secondary | ICD-10-CM | POA: Insufficient documentation

## 2021-07-12 DIAGNOSIS — R5383 Other fatigue: Secondary | ICD-10-CM | POA: Diagnosis not present

## 2021-07-12 DIAGNOSIS — F3132 Bipolar disorder, current episode depressed, moderate: Secondary | ICD-10-CM

## 2021-07-12 DIAGNOSIS — F19981 Other psychoactive substance use, unspecified with psychoactive substance-induced sexual dysfunction: Secondary | ICD-10-CM

## 2021-07-12 DIAGNOSIS — F4310 Post-traumatic stress disorder, unspecified: Secondary | ICD-10-CM

## 2021-07-12 MED ORDER — CLONAZEPAM 0.5 MG PO TABS: 0.5000 mg | ORAL_TABLET | Freq: Two times a day (BID) | ORAL | 0 refills | Status: DC

## 2021-07-12 MED ORDER — BUPROPION HCL ER (SR) 100 MG PO TB12: 100.0000 mg | ORAL_TABLET | Freq: Every day | ORAL | 0 refills | Status: DC

## 2021-07-12 MED ORDER — LORAZEPAM 0.5 MG PO TABS: 0.5000 mg | ORAL_TABLET | Freq: Two times a day (BID) | ORAL | 0 refills | Status: DC | PRN

## 2021-07-12 NOTE — Addendum Note (Signed)
Addended by: Vincent Gros on: 07/12/2021 01:35 PM   Modules accepted: Orders
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
## 2021-07-12 NOTE — Patient Instructions (Signed)

## 2021-08-02 ENCOUNTER — Telehealth: Payer: Self-pay | Admitting: Nurse Practitioner

## 2021-08-06 ENCOUNTER — Encounter (HOSPITAL_COMMUNITY): Payer: Self-pay | Admitting: Physician Assistant

## 2021-08-12 ENCOUNTER — Telehealth: Payer: Self-pay

## 2021-08-12 NOTE — Telephone Encounter (Signed)
LVM for pt to call back to schedule a AWV with PCP or my self. I am available 08/19/21 and 08/20/21 from 8 to 12. Office number was given.    Kim H. RMA  

## 2021-09-14 ENCOUNTER — Ambulatory Visit: Payer: Medicare Other | Admitting: Nurse Practitioner

## 2021-10-04 ENCOUNTER — Other Ambulatory Visit: Payer: Self-pay | Admitting: Nurse Practitioner

## 2021-10-04 DIAGNOSIS — F411 Generalized anxiety disorder: Secondary | ICD-10-CM

## 2021-10-04 NOTE — Telephone Encounter (Signed)
He really needs to be seen. He was made an appointment last month that I think he didn't show up for or missed. He was also referred to psychiatry. Do I need to deny this officially? If so, can you contact the patient to let him know?  Thanks so much.   -HB

## 2021-10-09 ENCOUNTER — Other Ambulatory Visit: Payer: Self-pay

## 2021-10-09 ENCOUNTER — Ambulatory Visit (INDEPENDENT_AMBULATORY_CARE_PROVIDER_SITE_OTHER): Payer: Medicare Other | Admitting: Nurse Practitioner

## 2021-10-09 ENCOUNTER — Encounter: Payer: Self-pay | Admitting: Nurse Practitioner

## 2021-10-09 VITALS — BP 117/75 | HR 88 | Temp 98.0°F | Ht 68.0 in | Wt 283.3 lb

## 2021-10-09 DIAGNOSIS — F3132 Bipolar disorder, current episode depressed, moderate: Secondary | ICD-10-CM

## 2021-10-09 DIAGNOSIS — F411 Generalized anxiety disorder: Secondary | ICD-10-CM | POA: Diagnosis not present

## 2021-10-09 DIAGNOSIS — R5383 Other fatigue: Secondary | ICD-10-CM

## 2021-10-09 DIAGNOSIS — F4310 Post-traumatic stress disorder, unspecified: Secondary | ICD-10-CM

## 2021-10-09 DIAGNOSIS — Z6841 Body Mass Index (BMI) 40.0 and over, adult: Secondary | ICD-10-CM

## 2021-10-09 MED ORDER — LORAZEPAM 0.5 MG PO TABS: 0.5000 mg | ORAL_TABLET | Freq: Two times a day (BID) | ORAL | 1 refills | Status: DC | PRN

## 2021-10-09 MED ORDER — ESCITALOPRAM OXALATE 20 MG PO TABS: 20.0000 mg | ORAL_TABLET | Freq: Every day | ORAL | 0 refills | Status: DC

## 2021-10-09 MED ORDER — PRAZOSIN HCL 2 MG PO CAPS: 2.0000 mg | ORAL_CAPSULE | Freq: Every day | ORAL | 1 refills | Status: DC

## 2021-10-09 MED ORDER — DIVALPROEX SODIUM 500 MG PO DR TAB: 500.0000 mg | DELAYED_RELEASE_TABLET | Freq: Three times a day (TID) | ORAL | 0 refills | Status: DC

## 2021-10-09 NOTE — Progress Notes (Signed)
Established Patient Office Visit  Subjective:  Patient ID: Hunter Cook, male    DOB: 01/25/1996  Age: 25 y.o. MRN: [id number]  CC:  Chief Complaint  Patient presents with   Follow-up   Medication Refill    HPI [name] presents for bipolar disorder. Came to me around august with diagnosis of bipolar disorder. Had been started on Depakote per mobile doc provider. I refilled medications as they were and referred to psychiatry. Patient has had some changes in insurance. States that he never heard from psychiatry referral. States that when he did, all appointments are far off in the future. States that anger and irritability have been increased lately. 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 Now he is at home all the time with his wife and anger and irritability are aimed at her. He does not feel as though medication is working well for him at current doses. A dose of Wellbutrin 143m had been added in 06/2021 to help with some mild ED symptoms. He states that this resolved and he is no longer taking the Wellbutrin. He denies feeling suicidal. He does not want to harm himself or anyone else at this time.   [facility] 10/09/2021 07/12/2021 04/26/2021  Decreased Interest _0 Down, Depressed, Hopeless _1 PHQ - 2 Score _2 Altered sleeping 1 1 0  Tired, decreased energy _3 Change in appetite 1 1 0  Feeling bad or failure about yourself  _4 Trouble concentrating 1 1 0  Moving slowly or fidgety/restless 1 1 0  Suicidal thoughts 0 1 0  PHQ-9 Score _5 Some encounter information is confidential and restricted. Go to Review Flowsheets activity to see all data.    He denies other physical concerns or complaints at this time. He denies chest pain, chest pressure, or shortness of breath. He denies headaches or visual disturbances. He denies abdominal pain, nausea, vomiting, or changes  in bowel or bladder habits.   I did review his PDMP profile today. His overdose risk score is moderately elevated at 520 due to medications prescribed earlier in the year. His fill history since coming to this office has been appropriate.   Past Medical History:  Diagnosis Date   Bipolar 1 disorder ([place]    Bipolar disorder ([place]    Snake bite     Past Surgical History:  Procedure Laterality Date   FOOT SURGERY Right 2015   crush injury - 2 screws and 1 plate surgically placed   FRACTURE SURGERY     HARDWARE REMOVAL Left 01/14/2017   Procedure: Left Ankle Syndesmotic Screw Removal;  Surgeon: MMarybelle Killings MD;  [place]  Service: Orthopedics;  Laterality: Left;   INCISION AND DRAINAGE Right 2012   snake bite to hand; had ~ 6 ORs related to this   [place]   Multiple procedures due to snake bite   LAPAROTOMY N/A 12/20/2018   Procedure: EXPLORATORY LAPAROTOMY; SPLEENECTOMY;  Surgeon: RRalene Ok MD;  [place]  Service: General;  Laterality: N/A;   ORIF ANKLE FRACTURE Left 10/22/2016   Procedure: OPEN REDUCTION INTERNAL FIXATION (ORIF) BIMALLEOLAR ANKLE FRACTURE;  Surgeon: MMarybelle Killings MD;  [place]  Service: Orthopedics;  Laterality: Left;   ORIF ANKLE FRACTURE BIMALLEOLAR Left 10/22/2016  ORIF ANKLE FRACTURE BIMALLEOLAR Left 2017   ORIF ULNAR FRACTURE Left 12/22/2018   Procedure: OPEN REDUCTION INTERNAL FIXATION ULNAR FRACTURE;  Surgeon: Shona Needles, MD;  Location[place];  Service: Orthopedics;  Laterality: Left;   SKIN GRAFT  2012   "related to snake bite"   SKIN GRAFT  2013   when bitten by copperhead snake   TRACHEOSTOMY TUBE PLACEMENT N/A 01/13/2019   Procedure: TRACHEOSTOMY;  Surgeon: Georganna Skeans, MD;  Location[facility] OR;  Service: General;  Laterality: N/A;    Family History  Problem Relation Age of Onset   Heart attack Father    CAD Father    High blood pressure Father    High Cholesterol Father    High blood pressure  Mother    High Cholesterol Mother     Social History   Socioeconomic History   Marital status: Married    Spouse name: Not on file   Number of children: Not on file   Years of education: Not on file   Highest education level: Not on file  Occupational History   Not on file  Tobacco Use   Smoking status: Every Day    Packs/day: 0.50    Years: 8.00    Pack years: 4.00    Types: Cigarettes   Smokeless tobacco: Never   Tobacco comments:    quit using chewing tobacco  Vaping Use   Vaping Use: Former  Substance and Sexual Activity   Alcohol use: Yes    Comment: (father unclear of true amt - "a lot" socially 3-4 times a week   Drug use: Yes    Types: "Crack" cocaine, Marijuana    Comment: rarely   Sexual activity: Yes  Other Topics Concern   Not on file  Social History Narrative   ** Merged History Encounter **       Social Determinants of Health   Financial Resource Strain: Not on file  Food Insecurity: Not on file  Transportation Needs: Not on file  Physical Activity: Not on file  Stress: Not on file  Social Connections: Not on file  Intimate Partner Violence: Not on file    Outpatient Medications Prior to Visit  Medication Sig Dispense Refill   acetaminophen (TYLENOL) 325 MG tablet Take 2 tablets (650 mg total) by mouth every 6 (six) hours as needed for mild pain or fever (Fever >101.5).     buPROPion ER (WELLBUTRIN SR) 100 MG 12 hr tablet Take 1 tablet (100 mg total) by mouth daily at 2 PM. 90 tablet 0   divalproex (DEPAKOTE) 500 MG DR tablet Take 1 tablet (500 mg total) by mouth 2 (two) times daily. 180 tablet 0   escitalopram (LEXAPRO) 20 MG tablet Take 1 tablet (20 mg total) by mouth daily. Patient to take half tablet (10 mg total) daily for the first week. On the start of second week, patient to start taking 20 mg daily. 92 tablet 0   LORazepam (ATIVAN) 0.5 MG tablet Take 1 tablet (0.5 mg total) by mouth 2 (two) times daily as needed for anxiety. 60 tablet 0    prazosin (MINIPRESS) 1 MG capsule Take 1 capsule (1 mg total) by mouth at bedtime. 90 capsule 0   No facility-administered medications prior to visit.    No Known Allergies  ROS Review of Systems  Constitutional:  Positive for fatigue. Negative for activity change, chills and fever.  HENT:  Negative for congestion, postnasal drip, rhinorrhea, sinus pressure, sinus pain, sneezing and  sore throat.   Eyes: Negative.   Respiratory:  Negative for cough, shortness of breath and wheezing.   Cardiovascular:  Negative for chest pain and palpitations.  Gastrointestinal:  Negative for constipation, diarrhea, nausea and vomiting.  Endocrine: Negative for cold intolerance, heat intolerance, polydipsia and polyuria.  Genitourinary:  Negative for dysuria, frequency and urgency.  Musculoskeletal:  Negative for back pain and myalgias.  Skin:  Negative for rash.  Allergic/Immunologic: Negative for environmental allergies.  Neurological:  Negative for dizziness, weakness and headaches.  Psychiatric/Behavioral:  Positive for agitation, behavioral problems, dysphoric mood and sleep disturbance. The patient is nervous/anxious.      Objective:    Physical Exam Vitals and nursing note reviewed.  Constitutional:      Appearance: Normal appearance. He is well-developed. He is obese.  HENT:     Head: Normocephalic and atraumatic.     Nose: Nose normal.     Mouth/Throat:     Mouth: Mucous membranes are moist.  Eyes:     Extraocular Movements: Extraocular movements intact.     Conjunctiva/sclera: Conjunctivae normal.     Pupils: Pupils are equal, round, and reactive to light.  Cardiovascular:     Rate and Rhythm: Normal rate and regular rhythm.     Pulses: Normal pulses.     Heart sounds: Normal heart sounds.  Pulmonary:     Effort: Pulmonary effort is normal.     Breath sounds: Normal breath sounds.  Abdominal:     Palpations: Abdomen is soft.  Musculoskeletal:        General: Normal range of  motion.     Cervical back: Normal range of motion and neck supple.  Lymphadenopathy:     Cervical: No cervical adenopathy.  Skin:    General: Skin is warm and dry.     Capillary Refill: Capillary refill takes less than 2 seconds.  Neurological:     General: No focal deficit present.     Mental Status: He is alert and oriented to person, place, and time.  Psychiatric:        Attention and Perception: Attention and perception normal.        Mood and Affect: Mood is anxious and depressed. Affect is angry.        Speech: Speech normal.        Behavior: Behavior normal. Behavior is cooperative.        Thought Content: Thought content normal.        Cognition and Memory: Cognition and memory normal.        Judgment: Judgment normal.    Today's Vitals   10/09/21 1607  BP: 117/75  Pulse: 88  Temp: 98 F (36.7 C)  SpO2: 97%  Weight: 283 lb 4.8 oz (128.5 kg)  Height: _0  (1.727 m)   Body mass index is 43.08 kg/m.   Wt Readings from Last 3 Encounters:  10/09/21 283 lb 4.8 oz (128.5 kg)  07/12/21 276 lb (125.2 kg)  04/26/21 272 lb 14.4 oz (123.8 kg)     Health Maintenance Due  Topic Date Due   COVID-19 Vaccine (1) Never done   INFLUENZA VACCINE  06/26/2021    There are no preventive care reminders to display for this patient.  Lab Results  Component Value Date   TSH 1.690 04/17/2021   Lab Results  Component Value Date   WBC 11.0 (H) 04/17/2021   HGB 16.1 04/17/2021   HCT 47.2 04/17/2021   MCV 90 04/17/2021   PLT 452 (  H) 04/17/2021   Lab Results  Component Value Date   NA 138 04/17/2021   K 5.1 04/17/2021   CO2 22 06/28/2019   GLUCOSE 80 04/17/2021   BUN 17 04/17/2021   CREATININE 0.81 04/17/2021   BILITOT 0.3 04/17/2021   ALKPHOS 85 04/17/2021   AST 19 04/17/2021   ALT 22 06/12/2019   PROT 7.7 04/17/2021   ALBUMIN 5.0 04/17/2021   CALCIUM 10.2 04/17/2021   ANIONGAP 15 06/28/2019   EGFR 124 04/17/2021   No results found for: CHOL No results found  for: HDL No results found for: [facility] Lab Results  Component Value Date   TRIG 328 (H) 12/20/2018   No results found for: CHOLHDL No results found for: HGBA1C    Assessment & Plan:  1. Other fatigue Likely worsening due to severity of bipolar disorder. Will monitor   2. Bipolar affective disorder, currently depressed, moderate (HCC) Will increase Depakote 581m to TID. Continue escitalopram as prescribed. An urgent referral to psychiatry was made today as patient was initially referred in 06/2021 and has not been able to get initial visit scheduled  will follow up with patient in 4 weeks. - divalproex (DEPAKOTE) 500 MG DR tablet; Take 1 tablet (500 mg total) by mouth 3 (three) times daily.  Dispense: 270 tablet; Refill: 0 - escitalopram (LEXAPRO) 20 MG tablet; Take 1 tablet (20 mg total) by mouth daily. Patient to take half tablet (10 mg total) daily for the first week. On the start of second week, patient to start taking 20 mg daily.  Dispense: 92 tablet; Refill: 0 - Ambulatory referral to Psychiatry  3. Posttraumatic stress disorder Increase prazosin to 257mat bedtime. Continue escitalopram as prescribed. Referral to psychiatry  - escitalopram (LEXAPRO) 20 MG tablet; Take 1 tablet (20 mg total) by mouth daily. Patient to take half tablet (10 mg total) daily for the first week. On the start of second week, patient to start taking 20 mg daily.  Dispense: 92 tablet; Refill: 0 - prazosin (MINIPRESS) 2 MG capsule; Take 1 capsule (2 mg total) by mouth at bedtime.  Dispense: 30 capsule; Refill: 1 - Ambulatory referral to Psychiatry  4. Generalized anxiety disorder May continue lorazepam 0.92m23mp to twice daily as needed for acute anxiety. Continue lexapro as prescribed. Urgent referral to psychiatry placed today.  - escitalopram (LEXAPRO) 20 MG tablet; Take 1 tablet (20 mg total) by mouth daily. Patient to take half tablet (10 mg total) daily for the first week. On the start of second week,  patient to start taking 20 mg daily.  Dispense: 92 tablet; Refill: 0 - LORazepam (ATIVAN) 0.5 MG tablet; Take 1 tablet (0.5 mg total) by mouth 2 (two) times daily as needed for anxiety.  Dispense: 60 tablet; Refill: 1  5. Body mass index (BMI) of 40.1-44.9 in adult ([facility] to limit calorie intake to 2000 cal/day or less.  He should consume a low cholesterol, low-fat diet.  Patient incorporate exercise into his daily routine.    Problem List Items Addressed This Visit       Other   Bipolar affective disorder, currently depressed, moderate (HCC)   Relevant Medications   divalproex (DEPAKOTE) 500 MG DR tablet   escitalopram (LEXAPRO) 20 MG tablet   LORazepam (ATIVAN) 0.5 MG tablet   Other Relevant Orders   Ambulatory referral to Psychiatry   Generalized anxiety disorder   Relevant Medications   escitalopram (LEXAPRO) 20 MG tablet   LORazepam (ATIVAN) 0.5 MG tablet  Posttraumatic stress disorder   Relevant Medications   escitalopram (LEXAPRO) 20 MG tablet   LORazepam (ATIVAN) 0.5 MG tablet   prazosin (MINIPRESS) 2 MG capsule   Other Relevant Orders   Ambulatory referral to Psychiatry   Other fatigue - Primary   Body mass index (BMI) of 40.1-44.9 in adult [facility])    Meds ordered this encounter  Medications   divalproex (DEPAKOTE) 500 MG DR tablet    Sig: Take 1 tablet (500 mg total) by mouth 3 (three) times daily.    Dispense:  270 tablet    Refill:  0    Order Specific Question:   Supervising Provider    Answer:   Silverio Decamp [[id number]]   escitalopram (LEXAPRO) 20 MG tablet    Sig: Take 1 tablet (20 mg total) by mouth daily. Patient to take half tablet (10 mg total) daily for the first week. On the start of second week, patient to start taking 20 mg daily.    Dispense:  92 tablet    Refill:  0    Order Specific Question:   Supervising Provider    Answer:   Silverio Decamp [[id number]]   LORazepam (ATIVAN) 0.5 MG tablet    Sig: Take 1 tablet (0.5 mg  total) by mouth 2 (two) times daily as needed for anxiety.    Dispense:  60 tablet    Refill:  1    Order Specific Question:   Supervising Provider    Answer:   Silverio Decamp [[id number]]   prazosin (MINIPRESS) 2 MG capsule    Sig: Take 1 capsule (2 mg total) by mouth at bedtime.    Dispense:  30 capsule    Refill:  1    Order Specific Question:   Supervising Provider    Answer:   Silverio Decamp [[id number]]     Follow-up: Return in about 4 weeks (around 11/06/2021) for mood.    Ronnell Freshwater, NP

## 2021-10-09 NOTE — Patient Instructions (Signed)
Fat and Cholesterol Restricted Eating Plan Getting too much fat and cholesterol in your diet may cause health problems. Choosing the right foods helps keep your fat and cholesterol at normal levels. This can keep you from getting certain diseases. Your doctor may recommend an eating plan that includes: Total fat: ______% or less of total calories a day. This is ______g of fat a day. Saturated fat: ______% or less of total calories a day. This is ______g of saturated fat a day. Cholesterol: less than _________mg a day. Fiber: ______g a day. What are tips for following this plan? General tips Work with your doctor to lose weight if you need to. Avoid: Foods with added sugar. Fried foods. Foods with trans fat or partially hydrogenated oils. This includes some margarines and baked goods. If you drink alcohol: Limit how much you have to: 0-1 drink a day for women who are not pregnant. 0-2 drinks a day for men. Know how much alcohol is in a drink. In the [place] one drink equals one 12 oz bottle of beer (355 mL), one 5 oz glass of wine (148 mL), or one 1 oz glass of hard liquor (44 mL). Reading food labels Check food labels for: Trans fats. Partially hydrogenated oils. Saturated fat (g) in each serving. Cholesterol (mg) in each serving. Fiber (g) in each serving. Choose foods with healthy fats, such as: Monounsaturated fats and polyunsaturated fats. These include olive and canola oil, flaxseeds, walnuts, almonds, and seeds. Omega-3 fats. These are found in certain fish, flaxseed oil, and ground flaxseeds. Choose grain products that have whole grains. Look for the word "whole" as the first word in the ingredient list. Cooking Cook foods using low-fat methods. These include baking, boiling, grilling, and broiling. Eat more home-cooked foods. Eat at restaurants and buffets less often. Eat less fast food. Avoid cooking using saturated fats, such as butter, cream, palm oil, palm kernel oil, and  coconut oil. Meal planning  At meals, divide your plate into four equal parts: Fill one-half of your plate with vegetables, green salads, and fruit. Fill one-fourth of your plate with whole grains. Fill one-fourth of your plate with low-fat (lean) protein foods. Eat fish that is high in omega-3 fats at least two times a week. This includes mackerel, tuna, sardines, and salmon. Eat foods that are high in fiber, such as whole grains, beans, apples, pears, berries, broccoli, carrots, peas, and barley. What foods should I eat? Fruits All fresh, canned (in natural juice), or frozen fruits. Vegetables Fresh or frozen vegetables (raw, steamed, roasted, or grilled). Green salads. Grains Whole grains, such as whole wheat or whole grain breads, crackers, cereals, and pasta. Unsweetened oatmeal, bulgur, barley, quinoa, or brown rice. Corn or whole wheat flour tortillas. Meats and other protein foods Ground beef (85% or leaner), grass-fed beef, or beef trimmed of fat. Skinless chicken or turkey. Ground chicken or turkey. Pork trimmed of fat. All fish and seafood. Egg whites. Dried beans, peas, or lentils. Unsalted nuts or seeds. Unsalted canned beans. Nut butters without added sugar or oil. Dairy Low-fat or nonfat dairy products, such as skim or 1% milk, 2% or reduced-fat cheeses, low-fat and fat-free ricotta or cottage cheese, or plain low-fat and nonfat yogurt. Fats and oils Tub margarine without trans fats. Light or reduced-fat mayonnaise and salad dressings. Avocado. Olive, canola, sesame, or safflower oils. The items listed above may not be a complete list of foods and beverages you can eat. Contact a dietitian for more information. What foods   should I avoid? Fruits Canned fruit in heavy syrup. Fruit in cream or butter sauce. Fried fruit. Vegetables Vegetables cooked in cheese, cream, or butter sauce. Fried vegetables. Grains White bread. White pasta. White rice. Cornbread. Bagels, pastries,  and croissants. Crackers and snack foods that contain trans fat and hydrogenated oils. Meats and other protein foods Fatty cuts of meat. Ribs, chicken wings, bacon, sausage, bologna, salami, chitterlings, fatback, hot dogs, bratwurst, and packaged lunch meats. Liver and organ meats. Whole eggs and egg yolks. Chicken and turkey with skin. Fried meat. Dairy Whole or 2% milk, cream, half-and-half, and cream cheese. Whole milk cheeses. Whole-fat or sweetened yogurt. Full-fat cheeses. Nondairy creamers and whipped toppings. Processed cheese, cheese spreads, and cheese curds. Fats and oils Butter, stick margarine, lard, shortening, ghee, or bacon fat. Coconut, palm kernel, and palm oils. Beverages Alcohol. Sugar-sweetened drinks such as sodas, lemonade, and fruit drinks. Sweets and desserts Corn syrup, sugars, honey, and molasses. Candy. Jam and jelly. Syrup. Sweetened cereals. Cookies, pies, cakes, donuts, muffins, and ice cream. The items listed above may not be a complete list of foods and beverages you should avoid. Contact a dietitian for more information. Summary Choosing the right foods helps keep your fat and cholesterol at normal levels. This can keep you from getting certain diseases. At meals, fill one-half of your plate with vegetables, green salads, and fruits. Eat high fiber foods, like whole grains, beans, apples, pears, berries, carrots, peas, and barley. Limit added sugar, saturated fats, alcohol, and fried foods. This information is not intended to replace advice given to you by your health care provider. Make sure you discuss any questions you have with your health care provider. Document Revised: 03/24/2021 Document Reviewed: 03/24/2021 Elsevier Patient Education  2022 [employer]  

## 2021-11-06 ENCOUNTER — Ambulatory Visit: Payer: Medicare Other | Admitting: Nurse Practitioner

## 2021-11-09 ENCOUNTER — Encounter (HOSPITAL_COMMUNITY): Payer: Medicare Other | Admitting: Physician Assistant

## 2021-11-17 ENCOUNTER — Other Ambulatory Visit (HOSPITAL_COMMUNITY): Payer: Self-pay | Admitting: Physician Assistant

## 2021-11-17 ENCOUNTER — Telehealth (INDEPENDENT_AMBULATORY_CARE_PROVIDER_SITE_OTHER): Payer: Medicare Other | Admitting: Physician Assistant

## 2021-11-17 ENCOUNTER — Encounter (HOSPITAL_COMMUNITY): Payer: Self-pay | Admitting: Physician Assistant

## 2021-11-17 DIAGNOSIS — F411 Generalized anxiety disorder: Secondary | ICD-10-CM

## 2021-11-17 DIAGNOSIS — F4310 Post-traumatic stress disorder, unspecified: Secondary | ICD-10-CM

## 2021-11-17 DIAGNOSIS — F3132 Bipolar disorder, current episode depressed, moderate: Secondary | ICD-10-CM

## 2021-11-17 MED ORDER — ESCITALOPRAM OXALATE 20 MG PO TABS: 40.0000 mg | ORAL_TABLET | Freq: Every day | ORAL | 1 refills | Status: DC

## 2021-11-17 MED ORDER — ARIPIPRAZOLE 5 MG PO TABS: 5.0000 mg | ORAL_TABLET | Freq: Every day | ORAL | 1 refills | Status: DC

## 2021-11-17 NOTE — Progress Notes (Addendum)
[place] MD/PA/NP OP Progress Note  Virtual Visit via Telephone Note  I connected with Hunter Cook on 11/17/21 at 11:00 AM EST by telephone and verified that I am speaking with the correct person using two identifiers.  Location: Patient: Home Provider: Clinic   I discussed the limitations, risks, security and privacy concerns of performing an evaluation and management service by telephone and the availability of in person appointments. I also discussed with the patient that there may be a patient responsible charge related to this service. The patient expressed understanding and agreed to proceed.  Follow Up Instructions:   I discussed the assessment and treatment plan with the patient. The patient was provided an opportunity to ask questions and all were answered. The patient agreed with the plan and demonstrated an understanding of the instructions.   The patient was advised to call back or seek an in-person evaluation if the symptoms worsen or if the condition fails to improve as anticipated.  I provided 18 minutes of non-face-to-face time during this encounter.  Hunter Mood, PA    11/17/2021 3:17 PM Hunter Cook  MRN:  [id number]  Chief Complaint: Follow up and medication management  HPI:   Hunter Cook is a 25 year old male with a past psychiatric history significant for bipolar disorder, PTSD, generalized anxiety disorder, and sleep disturbances who presents to [facility] via virtual telephone visit, accompanied by his wife, for follow-up and medication management.  Patient was last seen by this provider on June 09, 2021 and was transferred over to an outpatient psychiatric facility he qualified for.  Patient presents to the clinic on the following medications:  Ativan 0.5 mg 2 times daily as needed Depakote 500 mg ER tablet 3 times daily Prazosin 2 mg at bedtime Escitalopram 20 mg daily  Patient reports that he has not  been doing very good.  Patient reports that he is out of work and that he feels that the medication he is taking is not effective in the management of his symptoms.  He reports that he always feels on edge and is angry.  Prior to establishing care at a new psychiatric facility, patient was placed on Klonopin by this provider.  Patient reports that his previous provider took him off Klonopin and placed him on Ativan.  He reports that Ativan has not been able to take the edge off like Klonopin did.  Patient reports that he is tired of feeling the way he does.  He endorses the following depressive symptoms: feelings of sadness, lack of motivation, decreased energy, irritability, feelings of guilt/worthlessness, and hopelessness.  Patient denies changes in his eating habits or sleeping patterns.  Patient denies any major events contributing to his current emotional state but his wife does endorse that the patient continues to be impacted by the loss of his father.  Patient also endorses stressors related to taking care of his recent newborn baby.  Patient reports that he has been taking Depakote since elementary school and has also been on Wellbutrin.  He believes that his Depakote is not effective anymore and that the only medication that seems to help him out has been Ativan.  Patient's wife informs provider that patient needs help with his medications so that the patient is allowed to go back to work.  A PHQ-9 screen was performed with the patient scoring a 13.  A GAD-7 screen was also performed with the patient scoring a 14.  Patient is alert and  oriented x4, calm, cooperative, and fully engaged in conversation during the encounter.  Patient states that he feels lost hopeless.  Patient denies suicidal or homicidal ideations.  He further denies auditory or visual hallucinations and does not appear to be responding to internal/external stimuli.  Patient endorses good sleep and receives on average 8 hours of sleep  each night.  Patient endorses good appetite and eats on average 2-3 meals per day.  Patient endorses alcohol consumption occasionally.  He does admit to being on probation due to DUI while under the influence of alcohol.  Patient endorses tobacco use and smokes on average a pack per day.  Patient endorses illicit drug use in the form of marijuana occasionally.  Visit Diagnosis:    ICD-10-CM   1. Bipolar affective disorder, currently depressed, moderate (HCC)  F31.32 escitalopram (LEXAPRO) 20 MG tablet    ARIPiprazole (ABILIFY) 5 MG tablet    2. Posttraumatic stress disorder  F43.10 escitalopram (LEXAPRO) 20 MG tablet    3. Generalized anxiety disorder  F41.1 escitalopram (LEXAPRO) 20 MG tablet      Past Psychiatric History:  PTSD Generalized anxiety disorder Bipolar disorder  Past Medical History:  Past Medical History:  Diagnosis Date   Bipolar 1 disorder ([place])    Bipolar disorder ([place])    Snake bite     Past Surgical History:  Procedure Laterality Date   FOOT SURGERY Right 2015   crush injury - 2 screws and 1 plate surgically placed   FRACTURE SURGERY     HARDWARE REMOVAL Left 01/14/2017   Procedure: Left Ankle Syndesmotic Screw Removal;  Surgeon: Marybelle Killings, MD;  Location: [place];  Service: Orthopedics;  Laterality: Left;   INCISION AND DRAINAGE Right 2012   snake bite to hand; had ~ 6 ORs related to this   [place]  2012   Multiple procedures due to snake bite   LAPAROTOMY N/A 12/20/2018   Procedure: EXPLORATORY LAPAROTOMY; SPLEENECTOMY;  Surgeon: Ralene Ok, MD;  Location: [place];  Service: General;  Laterality: N/A;   ORIF ANKLE FRACTURE Left 10/22/2016   Procedure: OPEN REDUCTION INTERNAL FIXATION (ORIF) BIMALLEOLAR ANKLE FRACTURE;  Surgeon: Marybelle Killings, MD;  Location: [place];  Service: Orthopedics;  Laterality: Left;   ORIF ANKLE FRACTURE BIMALLEOLAR Left 10/22/2016   ORIF ANKLE FRACTURE BIMALLEOLAR Left 2017   ORIF ULNAR FRACTURE Left 12/22/2018    Procedure: OPEN REDUCTION INTERNAL FIXATION ULNAR FRACTURE;  Surgeon: Shona Needles, MD;  Location: [place];  Service: Orthopedics;  Laterality: Left;   SKIN GRAFT  2012   "related to snake bite"   SKIN GRAFT  2013   when bitten by copperhead snake   TRACHEOSTOMY TUBE PLACEMENT N/A 01/13/2019   Procedure: TRACHEOSTOMY;  Surgeon: Georganna Skeans, MD;  Location: [place];  Service: General;  Laterality: N/A;    Family Psychiatric History:  Patient reports that his family never went to a doctor for mental health but he assumes that his father may have had some mental health issues.  Family History:  Family History  Problem Relation Age of Onset   Heart attack Father    CAD Father    High blood pressure Father    High Cholesterol Father    High blood pressure Mother    High Cholesterol Mother     Social History:  Social History   Socioeconomic History   Marital status: Married    Spouse name: Not on file   Number of children: Not on file  Years of education: Not on file   Highest education level: Not on file  Occupational History   Not on file  Tobacco Use   Smoking status: Every Day    Packs/day: 0.50    Years: 8.00    Pack years: 4.00    Types: Cigarettes   Smokeless tobacco: Never   Tobacco comments:    quit using chewing tobacco  Vaping Use   Vaping Use: Former  Substance and Sexual Activity   Alcohol use: Yes    Comment: (father unclear of true amt - "a lot" socially 3-4 times a week   Drug use: Yes    Types: "Crack" cocaine, Marijuana    Comment: rarely   Sexual activity: Yes  Other Topics Concern   Not on file  Social History Narrative   ** Merged History Encounter **       Social Determinants of Health   Financial Resource Strain: Not on file  Food Insecurity: Not on file  Transportation Needs: Not on file  Physical Activity: Not on file  Stress: Not on file  Social Connections: Not on file    Allergies: No Known Allergies  Metabolic Disorder  Labs: No results found for: HGBA1C, MPG No results found for: PROLACTIN Lab Results  Component Value Date   TRIG 328 (H) 12/20/2018   Lab Results  Component Value Date   TSH 1.690 04/17/2021    Therapeutic Level Labs: No results found for: LITHIUM Lab Results  Component Value Date   VALPROATE 70 04/17/2021   No components found for:  CBMZ  Current Medications: Current Outpatient Medications  Medication Sig Dispense Refill   ARIPiprazole (ABILIFY) 5 MG tablet Take 1 tablet (5 mg total) by mouth daily. 30 tablet 1   acetaminophen (TYLENOL) 325 MG tablet Take 2 tablets (650 mg total) by mouth every 6 (six) hours as needed for mild pain or fever (Fever >101.5).     divalproex (DEPAKOTE) 500 MG DR tablet Take 1 tablet (500 mg total) by mouth 3 (three) times daily. 270 tablet 0   escitalopram (LEXAPRO) 20 MG tablet Take 2 tablets (40 mg total) by mouth daily. 60 tablet 1   LORazepam (ATIVAN) 0.5 MG tablet Take 1 tablet (0.5 mg total) by mouth 2 (two) times daily as needed for anxiety. 60 tablet 1   prazosin (MINIPRESS) 2 MG capsule Take 1 capsule (2 mg total) by mouth at bedtime. 30 capsule 1   No current facility-administered medications for this visit.     Musculoskeletal: Strength & Muscle Tone: Unable to assess due to telemedicine visit [place]: Unable to assess due to telemedicine visit Patient leans: Unable to assess due to telemedicine visit  Psychiatric Specialty Exam: Review of Systems  Psychiatric/Behavioral:  Positive for agitation, decreased concentration and dysphoric Cook. Negative for hallucinations, self-injury, sleep disturbance and suicidal ideas. The patient is nervous/anxious. The patient is not hyperactive.    There were no vitals taken for this visit.There is no height or weight on file to calculate BMI.  General Appearance: Unable to assess due to telemedicine visit  Eye Contact:  Unable to assess due to telemedicine visit  Speech:  Clear and  Coherent and Normal Rate  Volume:  Normal  Cook:  Anxious, Depressed, and Dysphoric  Affect:  Congruent and Depressed  Thought Process:  Coherent, Goal Directed, and Descriptions of Associations: Intact  Orientation:  Full (Time, Place, and Person)  Thought Content: WDL   Suicidal Thoughts:  No  Homicidal Thoughts:  No  Memory:  Immediate;   Good Recent;   Good Remote;   Good  Judgement:  Good  Insight:  Good  Psychomotor Activity:  Restlessness  Concentration:  Concentration: Good and Attention Span: Good  Recall:  Good  Fund of Knowledge: Good  Language: Good  Akathisia:  NA  Handed:  Right  AIMS (if indicated): not done  Assets:  Communication Skills Desire for Improvement Housing Social Support  ADL's:  Intact  Cognition: WNL  Sleep:  Good   Screenings: GAD-7    Flowsheet Row Video Visit from 11/17/2021 in [facility] Office Visit from 10/09/2021 in [place] [facility] [place] Visit from 07/12/2021 in [place] [facility] [place] Visit from 06/09/2021 in [facility] Office Visit from 04/26/2021 in [place] at [facility]  Total GAD-7 Score 14 7 5 17 12      $ PHQ2-9    Flowsheet Row Video Visit from 11/17/2021 in [facility] Office Visit from 10/09/2021 in [place] [facility] [place] Visit from 07/12/2021 in [place] at [place] Visit from 06/09/2021 in [facility] Office Visit from 04/26/2021 in [place] at [facility]  PHQ-2 Total Score 6 2 2 5 4  $ PHQ-9 Total Score 13 8 9 13 8      $ Flowsheet Row Video Visit from 11/17/2021 in [facility] ED from 06/15/2021 in [facility] [place] from 06/09/2021 in [place] No Risk No Risk No Risk         Assessment and Plan:   Hunter Cook is a 25 year old male with a past psychiatric history significant for bipolar disorder, PTSD, generalized anxiety disorder, and sleep disturbances who presents to [facility] via virtual telephone visit, accompanied by his wife, for follow-up and medication management.  Patient and his wife expressed that his current medication regimen has not been helpful in the management of his symptoms.  Since leaving our facility, patient has been taken off of Klonopin and placed on Ativan 0.5 mg 2 times daily as needed for the management of anxiety.  He reports that his Depakote does not appear to be effective anymore in managing his agitation and Cook.  Provider recommended patient increase his dosage of Lexapro from 20 mg to 40 mg daily for the management of his depressive symptoms and anxiety.  Patient was also recommended adding on Abilify 5 mg daily for the management of his Cook.  Patient is agreeable to recommendations.  Patient's medications to be e-prescribed to pharmacy of choice.  1. Bipolar affective disorder, currently depressed, moderate (HCC)  - escitalopram (LEXAPRO) 20 MG tablet; Take 2 tablets (40 mg total) by mouth daily.  Dispense: 60 tablet; Refill: 1 - ARIPiprazole (ABILIFY) 5 MG tablet; Take 1 tablet (5 mg total) by mouth daily.  Dispense: 30 tablet; Refill: 1  2. Posttraumatic stress disorder  - escitalopram (LEXAPRO) 20 MG tablet; Take 2 tablets (40 mg total) by mouth daily.  Dispense: 60 tablet; Refill: 1  3. Generalized anxiety disorder  - escitalopram (LEXAPRO) 20 MG tablet; Take 2 tablets (40 mg total) by mouth daily.  Dispense: 60 tablet; Refill: 1  Patient to follow up in 5 weeks Provider spent a total of 18 minutes with the patient/reviewing  the patient's chart  Hunter Mood, PA 11/17/2021, 3:17 PM

## 2021-11-22 ENCOUNTER — Other Ambulatory Visit (HOSPITAL_COMMUNITY): Payer: Self-pay | Admitting: Physician Assistant

## 2021-11-22 NOTE — Telephone Encounter (Signed)
Prior authorization to be completed. Pharmacy is requesting that prescription be changed to Lexapro 40 mg (max 1 pill a day). I don't think lexapro comes in a 40 mg formulation.

## 2021-12-24 ENCOUNTER — Other Ambulatory Visit: Payer: Self-pay | Admitting: Nurse Practitioner

## 2021-12-24 DIAGNOSIS — F411 Generalized anxiety disorder: Secondary | ICD-10-CM

## 2021-12-26 ENCOUNTER — Telehealth (INDEPENDENT_AMBULATORY_CARE_PROVIDER_SITE_OTHER): Payer: Medicare Other | Admitting: Physician Assistant

## 2021-12-26 ENCOUNTER — Encounter (HOSPITAL_COMMUNITY): Payer: Self-pay | Admitting: Physician Assistant

## 2021-12-26 DIAGNOSIS — F411 Generalized anxiety disorder: Secondary | ICD-10-CM | POA: Diagnosis not present

## 2021-12-26 DIAGNOSIS — F4310 Post-traumatic stress disorder, unspecified: Secondary | ICD-10-CM

## 2021-12-26 DIAGNOSIS — F3132 Bipolar disorder, current episode depressed, moderate: Secondary | ICD-10-CM | POA: Diagnosis not present

## 2021-12-26 MED ORDER — ARIPIPRAZOLE 10 MG PO TABS: 10.0000 mg | ORAL_TABLET | Freq: Every day | ORAL | 1 refills | Status: DC

## 2021-12-26 MED ORDER — ESCITALOPRAM OXALATE 10 MG PO TABS: ORAL_TABLET | ORAL | 0 refills | Status: DC

## 2021-12-26 MED ORDER — CLONAZEPAM 0.5 MG PO TABS: 0.5000 mg | ORAL_TABLET | Freq: Two times a day (BID) | ORAL | 0 refills | Status: DC | PRN

## 2021-12-26 MED ORDER — DIVALPROEX SODIUM 500 MG PO DR TAB: 500.0000 mg | DELAYED_RELEASE_TABLET | Freq: Three times a day (TID) | ORAL | 0 refills | Status: DC

## 2021-12-26 MED ORDER — PRAZOSIN HCL 2 MG PO CAPS: 2.0000 mg | ORAL_CAPSULE | Freq: Every day | ORAL | 1 refills | Status: DC

## 2021-12-26 NOTE — Progress Notes (Signed)
BH MD/PA/NP OP Progress Note  Virtual Visit via Telephone Note  I connected with Hunter Cook on 12/26/21 at  3:30 PM EST by telephone and verified that I am speaking with the correct person using two identifiers.  Location: Patient: Home Provider: Clinic   I discussed the limitations, risks, security and privacy concerns of performing an evaluation and management service by telephone and the availability of in person appointments. I also discussed with the patient that there may be a patient responsible charge related to this service. The patient expressed understanding and agreed to proceed.  Follow Up Instructions:   I discussed the assessment and treatment plan with the patient. The patient was provided an opportunity to ask questions and all were answered. The patient agreed with the plan and demonstrated an understanding of the instructions.   The patient was advised to call back or seek an in-person evaluation if the symptoms worsen or if the condition fails to improve as anticipated.  I provided 23 minutes of non-face-to-face time during this encounter.  Meta HatchetUchenna E Ura Hausen, PA    12/26/2021 4:13 PM Hunter CamperCody Lee Buchinger  MRN:  [id number]  Chief Complaint: Follow up and medication management  HPI:   Hunter Cook is a 26 year old male with a past psychiatric history significant for generalized anxiety disorder, bipolar disorder, and PTSD who [facility] via virtual telephone visit, accompanied by his wife, for follow-up and medication management.  Patient is currently being managed on the following medications:  Ativan 0.5 mg 2 times daily as needed Depakote 500 mg DR 3 times daily Prazosin 2 mg at bedtime Escitalopram 40 mg daily Abilify 5 mg daily  Patient reports that he continues to be stressed out a lot.  Patient's stressors include financial instability and the need to start counseling sessions.  He reports that in  order for him to start working again, it is imperative that he starts counseling per request of his boss.  He states that things are not going as they should even with the adjustments on his medications.  He does report some minimal relief in his irritability and low mood since taking Abilify 5 mg daily.  Although the medication is helpful, he states that the effects of Abilify seem to wear off by lunch.  Patient is interested in increasing his dosage of Abilify following the conclusion of the encounter.  Patient states that he does not feel as depressed since taking Lexapro 40 mg daily.  Provider informed patient that due to Lexapro being an antidepressant, it could possibly be negatively impacting his bipolar disorder.  Provider recommended that patient discontinue his Lexapro and increase his dosage of Abilify.  Patient was agreeable to recommendation.  Per patient's wife, patient continues to be fixated on stressors that he is unable to control.  She states that once 1 stressor has been resolved, he will immediately find another stressor to fixate over.  Patient rates his anxiety a 10 out of 10.  A PHQ-9 screen was performed with the patient scoring a 9.  A GAD-7 screen was also performed with the patient scoring a 13.  Patient is alert and oriented x4, calm, cooperative, and fully engaged in conversation during the encounter.  Patient endorses hopeless mood.  Patient denies suicidal or homicidal ideations.  He further denies auditory or visual hallucination does not appear to be responding to internal/external stimuli.  Patient endorses fair sleep and receives on average 6 to 8 hours of  sleep each night.  Patient endorses good appetite and eats on average 3 meals per day along with a snack.  Patient endorses alcohol consumption sparingly.  Patient endorses tobacco use and smokes on average a pack per day.  Patient denies illicit drug use.  Visit Diagnosis:    ICD-10-CM   1. Generalized anxiety disorder   F41.1 clonazePAM (KLONOPIN) 0.5 MG tablet    escitalopram (LEXAPRO) 10 MG tablet    2. Bipolar affective disorder, currently depressed, moderate (HCC)  F31.32 divalproex (DEPAKOTE) 500 MG DR tablet    ARIPiprazole (ABILIFY) 10 MG tablet    escitalopram (LEXAPRO) 10 MG tablet    3. Posttraumatic stress disorder  F43.10 escitalopram (LEXAPRO) 10 MG tablet    prazosin (MINIPRESS) 2 MG capsule      Past Psychiatric History:  PTSD Generalized anxiety disorder Bipolar disorder  Past Medical History:  Past Medical History:  Diagnosis Date   Bipolar 1 disorder (HCC)    Bipolar disorder (HCC)    Snake bite     Past Surgical History:  Procedure Laterality Date   FOOT SURGERY Right 2015   crush injury - 2 screws and 1 plate surgically placed   FRACTURE SURGERY     HARDWARE REMOVAL Left 01/14/2017   Procedure: Left Ankle Syndesmotic Screw Removal;  Surgeon: Eldred MangesMark C Yates, MD;  Location: [facility] OR;  Service: Orthopedics;  Laterality: Left;   INCISION AND DRAINAGE Right 2012   snake bite to hand; had ~ 6 ORs related to this   INCISION AND DRAINAGE  2012   Multiple procedures due to snake bite   LAPAROTOMY N/A 12/20/2018   Procedure: EXPLORATORY LAPAROTOMY; SPLEENECTOMY;  Surgeon: Axel Filleramirez, Armando, MD;  [facility];  Service: General;  Laterality: N/A;   ORIF ANKLE FRACTURE Left 10/22/2016   Procedure: OPEN REDUCTION INTERNAL FIXATION (ORIF) BIMALLEOLAR ANKLE FRACTURE;  Surgeon: Eldred MangesMark C Yates, MD;  Location: [facility] OR;  Service: Orthopedics;  Laterality: Left;   ORIF ANKLE FRACTURE BIMALLEOLAR Left 10/22/2016   ORIF ANKLE FRACTURE BIMALLEOLAR Left 2017   ORIF ULNAR FRACTURE Left 12/22/2018   Procedure: OPEN REDUCTION INTERNAL FIXATION ULNAR FRACTURE;  Surgeon: Roby LoftsHaddix, Kevin P, MD;  Location: [facility] OR;  Service: Orthopedics;  Laterality: Left;   SKIN GRAFT  2012   "related to snake bite"   SKIN GRAFT  2013   when bitten by copperhead snake   TRACHEOSTOMY TUBE PLACEMENT N/A 01/13/2019   Procedure:  TRACHEOSTOMY;  Surgeon: Violeta Gelinashompson, Burke, MD;  [facility];  Service: General;  Laterality: N/A;    Family Psychiatric History:  Patient reports that his family never went to a doctor for mental health but he assumes that his father may have had some mental health issues.  Family History:  Family History  Problem Relation Age of Onset   Heart attack Father    CAD Father    High blood pressure Father    High Cholesterol Father    High blood pressure Mother    High Cholesterol Mother     Social History:  Social History   Socioeconomic History   Marital status: Married    Spouse name: Not on file   Number of children: Not on file   Years of education: Not on file   Highest education level: Not on file  Occupational History   Not on file  Tobacco Use   Smoking status: Every Day    Packs/day: 0.50    Years: 8.00    Pack years: 4.00  Types: Cigarettes   Smokeless tobacco: Never   Tobacco comments:    quit using chewing tobacco  Vaping Use   Vaping Use: Former  Substance and Sexual Activity   Alcohol use: Yes    Comment: (father unclear of true amt - "a lot" socially 3-4 times a week   Drug use: Yes    Types: "Crack" cocaine, Marijuana    Comment: rarely   Sexual activity: Yes  Other Topics Concern   Not on file  Social History Narrative   ** Merged History Encounter **       Social Determinants of Health   Financial Resource Strain: Not on file  Food Insecurity: Not on file  Transportation Needs: Not on file  Physical Activity: Not on file  Stress: Not on file  Social Connections: Not on file    Allergies: No Known Allergies  Metabolic Disorder Labs: No results found for: HGBA1C, MPG No results found for: PROLACTIN Lab Results  Component Value Date   TRIG 328 (H) 12/20/2018   Lab Results  Component Value Date   TSH 1.690 04/17/2021    Therapeutic Level Labs: No results found for: LITHIUM Lab Results  Component Value Date   VALPROATE 70  04/17/2021   No components found for:  CBMZ  Current Medications: Current Outpatient Medications  Medication Sig Dispense Refill   clonazePAM (KLONOPIN) 0.5 MG tablet Take 1 tablet (0.5 mg total) by mouth 2 (two) times daily as needed for anxiety. 60 tablet 0   acetaminophen (TYLENOL) 325 MG tablet Take 2 tablets (650 mg total) by mouth every 6 (six) hours as needed for mild pain or fever (Fever >101.5).     ARIPiprazole (ABILIFY) 10 MG tablet Take 1 tablet (10 mg total) by mouth daily. 30 tablet 1   divalproex (DEPAKOTE) 500 MG DR tablet Take 1 tablet (500 mg total) by mouth 3 (three) times daily. 270 tablet 0   escitalopram (LEXAPRO) 10 MG tablet Patient discontinuing Lexapro. Patient to take full tablet (10 mg total) for 3 days, followed by half tablet (5 mg total) for 3 more days before discontinuing. 5 tablet 0   prazosin (MINIPRESS) 2 MG capsule Take 1 capsule (2 mg total) by mouth at bedtime. 30 capsule 1   No current facility-administered medications for this visit.     Musculoskeletal: Strength & Muscle Tone: Unable to assess due to telemedicine visit Gait & Station: Unable to assess due to telemedicine visit Patient leans: Unable to assess due to telemedicine visit  Psychiatric Specialty Exam: Review of Systems  Psychiatric/Behavioral:  Positive for agitation. Negative for decreased concentration, dysphoric mood, hallucinations, self-injury, sleep disturbance and suicidal ideas. The patient is nervous/anxious. The patient is not hyperactive.    There were no vitals taken for this visit.There is no height or weight on file to calculate BMI.  General Appearance: Unable to assess due to telemedicine visit  Eye Contact:  Unable to assess due to telemedicine visit  Speech:  Clear and Coherent and Normal Rate  Volume:  Normal  Mood:  Anxious and Depressed  Affect:  Congruent and Depressed  Thought Process:  Coherent, Goal Directed, and Descriptions of Associations: Intact   Orientation:  Full (Time, Place, and Person)  Thought Content: WDL   Suicidal Thoughts:  No  Homicidal Thoughts:  No  Memory:  Immediate;   Good Recent;   Good Remote;   Good  Judgement:  Good  Insight:  Good  Psychomotor Activity:  Restlessness  Concentration:  Concentration:  Good and Attention Span: Good  Recall:  Good  Fund of Knowledge: Good  Language: Good  Akathisia:  NA  Handed:  Right  AIMS (if indicated): not done  Assets:  Communication Skills Desire for Improvement Housing Social Support  ADL's:  Intact  Cognition: WNL  Sleep:  Good   Screenings: GAD-7    Flowsheet Row Video Visit from 12/26/2021 in [facility] Video Visit from 11/17/2021 in [facility] Office Visit from 10/09/2021 in [facility] Office Visit from 07/12/2021 in [facility] at [facility] Office Visit from 06/09/2021 in [facility]  Total GAD-7 Score 13 14 7 5 17       PHQ2-9    Flowsheet Row Video Visit from 12/26/2021 in [facility] Video Visit from 11/17/2021 in [facility] Office Visit from 10/09/2021 in [facility] at [facility] Office Visit from 07/12/2021 in [facility] at [facility] Office Visit from 06/09/2021 in [facility]  PHQ-2 Total Score 3 6 2 2 5   PHQ-9 Total Score 9 13 8 9 13       Flowsheet Row Video Visit from 12/26/2021 in [facility] Video Visit from 11/17/2021 in [facility] ED from 06/15/2021 in [facility] RISK CATEGORY No Risk No Risk No Risk        Assessment and Plan:   Tarry L. Ostermiller is a 26 year old male with a past psychiatric history significant for generalized anxiety disorder, bipolar disorder, and PTSD who presents to [facility] via virtual telephone visit, accompanied by his wife, for follow-up and medication management.  Patient to endorse anxiety and feeling on edge.  He reports that his irritability has been slightly reduced since taking Abilify 5 mg daily.  Patient is interested in increasing his dosage of the medication.  Provider recommended patient increase his dosage of Abilify from 5 mg to 10 mg daily for mood stability.  Patient was encouraged to taper off Lexapro before discontinuing.  Patient was informed to take Lexapro 20 mg daily for 2 days, followed by 10 mg for 3 days, followed by 5 mg for 3 days before discontinuing the medication.  Patient was agreeable to recommendations.  During the last encounter, patient states that Klonopin was more helpful in the management of his elevated anxiety than Ativan was.  Patient to be prescribed clonazepam 0.5 mg 2 times daily as needed for the management of his worsening anxiety.  Patient was agreeable to recommendation.  Patient's medications to be e-prescribed to pharmacy of choice.  Patient was informed that his next counseling session is scheduled for February 23rd at 9:00 AM Virtual Visit.  1. Generalized anxiety disorder  - clonazePAM (KLONOPIN) 0.5 MG tablet; Take 1 tablet (0.5 mg total) by mouth 2 (two) times daily as needed for anxiety.  Dispense: 60 tablet; Refill: 0 - escitalopram (LEXAPRO) 10 MG tablet; Patient discontinuing Lexapro. Patient to take full tablet (10 mg total) for 3 days, followed by half tablet (5 mg total) for 3 more days before discontinuing.  Dispense: 5 tablet; Refill: 0  2. Bipolar affective disorder, currently depressed, moderate (HCC)  - divalproex (DEPAKOTE) 500 MG DR tablet; Take 1 tablet (500 mg total) by mouth 3 (three) times daily.  Dispense: 270 tablet; Refill: 0 - ARIPiprazole (  ABILIFY) 10 MG tablet; Take 1 tablet (10 mg total) by mouth daily.  Dispense: 30 tablet; Refill: 1 - escitalopram  (LEXAPRO) 10 MG tablet; Patient discontinuing Lexapro. Patient to take full tablet (10 mg total) for 3 days, followed by half tablet (5 mg total) for 3 more days before discontinuing.  Dispense: 5 tablet; Refill: 0  3. Posttraumatic stress disorder  - escitalopram (LEXAPRO) 10 MG tablet; Patient discontinuing Lexapro. Patient to take full tablet (10 mg total) for 3 days, followed by half tablet (5 mg total) for 3 more days before discontinuing.  Dispense: 5 tablet; Refill: 0 - prazosin (MINIPRESS) 2 MG capsule; Take 1 capsule (2 mg total) by mouth at bedtime.  Dispense: 30 capsule; Refill: 1  Patient to follow up in 2 months Provider spent a total of 23 minutes with the patient/reviewing patient's chart  Meta Hatchet, PA 12/26/2021, 4:13 PM

## 2021-12-28 ENCOUNTER — Telehealth (HOSPITAL_COMMUNITY): Payer: Self-pay | Admitting: Physician Assistant

## 2021-12-28 ENCOUNTER — Other Ambulatory Visit (HOSPITAL_COMMUNITY): Payer: Self-pay | Admitting: Physician Assistant

## 2021-12-28 DIAGNOSIS — F4310 Post-traumatic stress disorder, unspecified: Secondary | ICD-10-CM

## 2021-12-28 DIAGNOSIS — F411 Generalized anxiety disorder: Secondary | ICD-10-CM

## 2021-12-28 DIAGNOSIS — F3132 Bipolar disorder, current episode depressed, moderate: Secondary | ICD-10-CM

## 2021-12-28 MED ORDER — CLONAZEPAM 0.5 MG PO TABS: 0.5000 mg | ORAL_TABLET | Freq: Two times a day (BID) | ORAL | 0 refills | Status: DC | PRN

## 2021-12-28 MED ORDER — PRAZOSIN HCL 2 MG PO CAPS: 2.0000 mg | ORAL_CAPSULE | Freq: Every day | ORAL | 1 refills | Status: DC

## 2021-12-28 MED ORDER — DIVALPROEX SODIUM 500 MG PO DR TAB: 500.0000 mg | DELAYED_RELEASE_TABLET | Freq: Three times a day (TID) | ORAL | 0 refills | Status: DC

## 2021-12-28 MED ORDER — ARIPIPRAZOLE 10 MG PO TABS: 10.0000 mg | ORAL_TABLET | Freq: Every day | ORAL | 1 refills | Status: DC

## 2021-12-28 NOTE — Telephone Encounter (Signed)
Patient contacted the office to inform that his previous preferred pharmacy ([facility] on [address]) did not receive his medication prescriptions. He is wanting to change his preferred pharmacy to [facility] and have his medications sent there.

## 2021-12-28 NOTE — Telephone Encounter (Signed)
Patient's medications were  e-prescribed to pharmacy of choice.

## 2021-12-28 NOTE — Progress Notes (Signed)
Patient's medications were resent to pharmacy of choice.

## 2022-01-01 NOTE — Telephone Encounter (Signed)
No further information given or received

## 2022-01-08 ENCOUNTER — Other Ambulatory Visit: Payer: Self-pay

## 2022-01-08 ENCOUNTER — Encounter: Payer: Self-pay | Admitting: Nurse Practitioner

## 2022-01-08 ENCOUNTER — Ambulatory Visit (INDEPENDENT_AMBULATORY_CARE_PROVIDER_SITE_OTHER): Payer: Medicare Other | Admitting: Nurse Practitioner

## 2022-01-08 VITALS — Ht 68.0 in | Wt 300.0 lb

## 2022-01-08 DIAGNOSIS — J029 Acute pharyngitis, unspecified: Secondary | ICD-10-CM | POA: Insufficient documentation

## 2022-01-08 DIAGNOSIS — R052 Subacute cough: Secondary | ICD-10-CM | POA: Diagnosis not present

## 2022-01-08 MED ORDER — BENZONATATE 200 MG PO CAPS: 200.0000 mg | ORAL_CAPSULE | Freq: Two times a day (BID) | ORAL | 0 refills | Status: DC | PRN

## 2022-01-08 MED ORDER — CLARITHROMYCIN 500 MG PO TABS: 500.0000 mg | ORAL_TABLET | Freq: Two times a day (BID) | ORAL | 0 refills | Status: DC

## 2022-01-08 NOTE — Progress Notes (Signed)
Virtual Visit via Telephone Note  I connected with Hunter Cook on 01/08/22 at  2:10 PM EST by telephone and verified that I am speaking with the correct person using two identifiers.  Location: Patient: home Provider: [place] primary care at [facility]     I discussed the limitations, risks, security and privacy concerns of performing an evaluation and management service by telephone and the availability of in person appointments. I also discussed with the patient that there may be a patient responsible charge related to this service. The patient expressed understanding and agreed to proceed.   History of Present Illness: The patient has had a two day history of sore throat. This worsened into lots of post nasal drip, and cough. States that his cough is keeping him awake at night. He is also getting a headache from coughing. He denies fever, body aches, or chills. He denies n/v/d. He is able to eat, smell, and taste without difficulty.  He states that two months ago he was seen in urgent care and treated for strep throat. Was put on amoxicillin. States that he took this for two days and when he felt better, he stopped taking the antibiotic altogether. He states that when these new symptoms started, he tried taking the amoxicillin and he has not felt any better.    Observations/Objective: Today's Vitals   01/08/22 1424  Weight: 300 lb (136.1 kg)  Height: 5\' 8"  (1.727 m)   Body mass index is 45.61 kg/m.   The patient is alert and oriented. He is pleasant and answering all questions appropriately. Breathing is non-labored. He is in no acute distress.  He sounds nasally congested and had intermittent, dry sounding cough.   Assessment and Plan: 1. Acute pharyngitis, unspecified etiology Start biaxin 500mg  twice daily for 7 days. Advised him to take the entire prescription as prescribed, even if feeling better after shorter time period. Rest and increase fluids. Continue using OTC  medication to control symptoms.  He voiced understanding and agreement.  - clarithromycin (BIAXIN) 500 MG tablet; Take 1 tablet (500 mg total) by mouth 2 (two) times daily.  Dispense: 14 tablet; Refill: 0  2. Subacute cough Tessalon perls 200mg  may be taken up to three times daily as needed for cough.  - benzonatate (TESSALON) 200 MG capsule; Take 1 capsule (200 mg total) by mouth 2 (two) times daily as needed for cough.  Dispense: 20 capsule; Refill: 0   Follow Up Instructions:    I discussed the assessment and treatment plan with the patient. The patient was provided an opportunity to ask questions and all were answered. The patient agreed with the plan and demonstrated an understanding of the instructions.   The patient was advised to call back or seek an in-person evaluation if the symptoms worsen or if the condition fails to improve as anticipated.  I provided 10 minutes of non-face-to-face time during this encounter.   Ronnell Freshwater, NP

## 2022-01-09 ENCOUNTER — Telehealth (HOSPITAL_COMMUNITY): Payer: Self-pay | Admitting: *Deleted

## 2022-01-09 NOTE — Telephone Encounter (Signed)
VM left for writer to inform his provider he is hardly sleeping, states he has slept "maybe 5 hours in the last 3 days" he would like something for sleep. He has a future appt on 02/21/22. I will forward patients concern to [facility].

## 2022-01-10 ENCOUNTER — Other Ambulatory Visit (HOSPITAL_COMMUNITY): Payer: Self-pay | Admitting: Physician Assistant

## 2022-01-10 DIAGNOSIS — F3132 Bipolar disorder, current episode depressed, moderate: Secondary | ICD-10-CM

## 2022-01-10 DIAGNOSIS — G479 Sleep disorder, unspecified: Secondary | ICD-10-CM

## 2022-01-10 MED ORDER — TRAZODONE HCL 50 MG PO TABS: 50.0000 mg | ORAL_TABLET | Freq: Every day | ORAL | 1 refills | Status: DC

## 2022-01-10 MED ORDER — ARIPIPRAZOLE 15 MG PO TABS: 15.0000 mg | ORAL_TABLET | Freq: Every day | ORAL | 1 refills | Status: DC

## 2022-01-10 NOTE — Progress Notes (Signed)
Provider was contacted by Glory Buff. Olevia Bowens, RN and Arloa Koh regarding patient's inability to sleep.  Provider was able to contact patient to discuss medications.  Provider recommended trazodone 50 mg at bedtime for the management of patient's sleep.  Provider also recommended increasing his dosage of Abilify from 10 mg to 15 mg daily for the management of his mood.  Patient was agreeable to recommendation.  Provider to speak with nurses to see if patient is eligible for GeneSight testing.

## 2022-01-10 NOTE — Telephone Encounter (Signed)
Provider was contacted by Wynona Luna, RN and Lauralee Evener regarding patient's issues with sleep.  Provider was able to contact patient and recommended trazodone 50 mg at bedtime for the management of his sleep disturbances.  Provider also recommended adjusting patient's Abilify dosage from 10 mg to 15 mg daily for the management of his mood.  Patient was agreeable to recommendations.

## 2022-01-10 NOTE — Telephone Encounter (Signed)
Pt called back to report he feels uncertain if the Abilify dose change is the reason for him not feeling like himself and not sleeping. Please call pt [phone]

## 2022-01-18 ENCOUNTER — Telehealth (HOSPITAL_COMMUNITY): Payer: Self-pay | Admitting: *Deleted

## 2022-01-18 ENCOUNTER — Ambulatory Visit (INDEPENDENT_AMBULATORY_CARE_PROVIDER_SITE_OTHER): Payer: Medicare Other | Admitting: Licensed Clinical Social Worker

## 2022-01-18 DIAGNOSIS — F3132 Bipolar disorder, current episode depressed, moderate: Secondary | ICD-10-CM | POA: Diagnosis not present

## 2022-01-18 DIAGNOSIS — F4310 Post-traumatic stress disorder, unspecified: Secondary | ICD-10-CM

## 2022-01-18 NOTE — Telephone Encounter (Signed)
Walk in wanting to speak with someone re his meds. Writer spoke with him and his concerns are he is not sleeping with the recent addition of Trazadone, he is gaining weight on the Abilify which was recently increased to 15 mg and he believes his weight gain is responsible for him being short of breath. He thinks he has gained 20# on the abilify. He also thinks the Depakote he has been taking since he was in the third grade isnt working either , he "may be immune to it" and he said he and [employer] PA discussed him doing the swab test. Will forward these concerns to the provider and writer will call Brynn back with any new directions. Discussed he may need to come in as a walk in on [address] in days as his future appt with him is 02/20/22 and he doesn't feel he can wait this long.
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
## 2022-01-18 NOTE — Plan of Care (Signed)
Pt agreeable to plan  ?

## 2022-01-19 ENCOUNTER — Telehealth (HOSPITAL_COMMUNITY): Payer: Self-pay | Admitting: *Deleted

## 2022-01-19 NOTE — Telephone Encounter (Signed)
Followed up with Link Snuffer PA and patient today re his concerns re his meds. Eddie would like patient to have Genesight testing first before any changes are made to his current medicines. I called him and Tasha will come in wed the 29th to see nursing staff around 9 am for testing.

## 2022-01-22 ENCOUNTER — Telehealth (HOSPITAL_COMMUNITY): Payer: Self-pay | Admitting: *Deleted

## 2022-01-22 ENCOUNTER — Telehealth (HOSPITAL_COMMUNITY): Payer: Self-pay | Admitting: Physician Assistant

## 2022-01-22 NOTE — Telephone Encounter (Signed)
PATIENT LEFT VM STATED HE'S BEEN OUT OF HIS [facility] SINCE Friday  & THAT HE'S HAVING TO TAKE 2 @ A TIME. STATED HE'S NOT DOING WELL HAVING PANIC & ANXIETY ATTACKS

## 2022-01-23 ENCOUNTER — Other Ambulatory Visit (HOSPITAL_COMMUNITY): Payer: Self-pay | Admitting: Physician Assistant

## 2022-01-23 ENCOUNTER — Telehealth (HOSPITAL_COMMUNITY): Payer: Self-pay | Admitting: *Deleted

## 2022-01-23 DIAGNOSIS — F411 Generalized anxiety disorder: Secondary | ICD-10-CM

## 2022-01-23 MED ORDER — CLONAZEPAM 0.5 MG PO TABS: 0.5000 mg | ORAL_TABLET | Freq: Two times a day (BID) | ORAL | 0 refills | Status: DC | PRN

## 2022-01-23 NOTE — Progress Notes (Signed)
Provider was contacted by Orpah Clinton. Reola Calkins, RN regarding patient's request for clonazepam prescription to be refilled.  Patient also informed the nurse that his medications have not been helpful in the management of his symptoms.  Nurse reminded patient that he has an appointment set up for tomorrow for GeneSight testing to determine what medications are most helpful in the management of the symptoms.  Patient's last prescription of clonazepam was filled on 12/28/2021.  Provider to prescribe patient clonazepam for January 26, 2022 which coincides with the prescription being filled within 30 days from last fill date (technically, refills should be placed for 01/27/2022 due to being 30 days out from last refill date; however, that would make medication available on Saturday).  Patient's medication to be e-prescribed to pharmacy of choice.

## 2022-01-23 NOTE — Telephone Encounter (Signed)
Call from patient needing his Klonopin reordered, and states the Abilify isnt working at all. Reminded med wont be changed till after he has the [employer] which is planned for tomorrow am. Will notify Eddie PA re new rx for his Klonopin.

## 2022-01-23 NOTE — Telephone Encounter (Signed)
Provider was contacted by Orpah Clinton. Hunter Calkins, RN regarding patient's request for clonazepam prescription to be refilled.  Patient medication to be e-prescribed for 01/26/2022.

## 2022-01-23 NOTE — Telephone Encounter (Signed)
Patient's prescription for clonazepam has been sent to pharmacy.  Patient's last prescription was sent on 12/28/2021 and next prescription will not be available until 01/26/2022 (30 days from last refill date).

## 2022-01-24 ENCOUNTER — Ambulatory Visit (HOSPITAL_COMMUNITY): Payer: Medicare Other

## 2022-01-24 ENCOUNTER — Other Ambulatory Visit: Payer: Self-pay

## 2022-01-24 ENCOUNTER — Telehealth (HOSPITAL_COMMUNITY): Payer: Self-pay | Admitting: *Deleted

## 2022-01-24 NOTE — Telephone Encounter (Signed)
In as scheduled for Genesight Testing due to his ongoing concerns re poor sleep and no benefit from Abilify. Testing submitted. ?

## 2022-01-29 ENCOUNTER — Telehealth (HOSPITAL_COMMUNITY): Payer: Self-pay | Admitting: Physician Assistant

## 2022-01-29 ENCOUNTER — Ambulatory Visit (HOSPITAL_COMMUNITY)
Admission: EM | Admit: 2022-01-29 | Discharge: 2022-01-29 | Disposition: A | Discharge status: Home / Self Care | Payer: Medicare Other | Attending: Psychiatry | Admitting: Psychiatry

## 2022-01-29 DIAGNOSIS — Z560 Unemployment, unspecified: Secondary | ICD-10-CM | POA: Diagnosis not present

## 2022-01-29 DIAGNOSIS — F3132 Bipolar disorder, current episode depressed, moderate: Secondary | ICD-10-CM

## 2022-01-29 DIAGNOSIS — Z79899 Other long term (current) drug therapy: Secondary | ICD-10-CM | POA: Insufficient documentation
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
## 2022-01-29 NOTE — Telephone Encounter (Signed)
Patient called emotional and teary stating he is in a mental health crisis and needs help right now. Informed pt his provider will be notified of his phone call stating he takes up to 6 anxiety pills and has no relief. Due to pt obvious extreme emotions and pt saying he needs help today for mental health crisis, pt says he will go to urgent care. Provider will be notified upon return tomorrow.

## 2022-01-29 NOTE — Discharge Instructions (Signed)

## 2022-01-29 NOTE — ED Triage Notes (Signed)
Pt presents to [facility] seeking medication management. Pt states " my meds are not working". Pt states " I just need something to take the edge off, I'm getting angry at my wife and being mean and I'm usually not like this". Pt states that he has a psychiatrist at [facility] upstairs in outpatient and he has an appointment on March 28th and last saw his psychiatrist last month. Pt states that he needs his medication managed and he cannot wait until March 28th due to his anger outbursts. Pt reports crying spells and anger outbursts. Pt denies SI/HI and AVH. ?

## 2022-01-30 ENCOUNTER — Other Ambulatory Visit (HOSPITAL_COMMUNITY): Payer: Self-pay | Admitting: Physician Assistant

## 2022-01-30 DIAGNOSIS — F3132 Bipolar disorder, current episode depressed, moderate: Secondary | ICD-10-CM

## 2022-01-30 MED ORDER — QUETIAPINE FUMARATE 50 MG PO TABS: 50.0000 mg | ORAL_TABLET | Freq: Every day | ORAL | 1 refills | Status: DC

## 2022-02-01 NOTE — Telephone Encounter (Signed)
Provider was contacted by Orpah Clinton. Reola Calkins, RN regarding patient's issues with medications.  Provider was able to contact patient and discuss issues with their medications.  Adjustments in the patient's medication regimen were made before the conclusion of the phone call.

## 2022-02-01 NOTE — Telephone Encounter (Signed)
Writer was able to reach out to patient to discuss his symptoms and issues with his medications.  Patient reports that he has been experiencing extreme anger and irritability.  Patient reports that he has discontinued taking his Abilify after leaving the medication and effective in the management of his symptoms.  Patient states that his anxiety level is very bad and everything around him seems to make him more agitated and want to explode.  Patient is still continuing to take his Depakote but denies relief from his symptoms.  Patient reports excessive eating and attributes his excessive eating to being bored.  Patient reports that he recently lost his job due to his irritability. ? ?Patient reports that he was recently placed on trazodone after being seen at [facility].  He reports receiving roughly 6 hours of sleep and only woke up once.  Provider recommended patient being placed on Seroquel 50 mg at bedtime for the management of his mood.  Patient was agreeable to recommendation.  Patient's medications to be prescribed to pharmacy of choice. ? ?Patient has a follow-up appointment graduate for 02/21/2022.  Provider informed patient that other medication options would be discussed during that encounter (Lamictal).

## 2022-02-01 NOTE — Telephone Encounter (Signed)
Message was acknowledged and reviewed.

## 2022-02-09 ENCOUNTER — Telehealth (HOSPITAL_COMMUNITY): Payer: Self-pay | Admitting: *Deleted

## 2022-02-09 ENCOUNTER — Other Ambulatory Visit (HOSPITAL_COMMUNITY): Payer: Self-pay | Admitting: Physician Assistant

## 2022-02-09 DIAGNOSIS — F3132 Bipolar disorder, current episode depressed, moderate: Secondary | ICD-10-CM

## 2022-02-09 DIAGNOSIS — F411 Generalized anxiety disorder: Secondary | ICD-10-CM

## 2022-02-09 MED ORDER — QUETIAPINE FUMARATE 100 MG PO TABS: 100.0000 mg | ORAL_TABLET | Freq: Every day | ORAL | 1 refills | Status: DC

## 2022-02-09 MED ORDER — LAMOTRIGINE 25 MG PO TABS: 50.0000 mg | ORAL_TABLET | Freq: Every day | ORAL | 1 refills | Status: DC

## 2022-02-09 MED ORDER — LAMOTRIGINE 25 MG PO TABS: 25.0000 mg | ORAL_TABLET | Freq: Every day | ORAL | 0 refills | Status: DC

## 2022-02-09 MED ORDER — CLONAZEPAM 1 MG PO TABS: 1.0000 mg | ORAL_TABLET | Freq: Two times a day (BID) | ORAL | 0 refills | Status: DC | PRN

## 2022-02-09 NOTE — Telephone Encounter (Signed)
Patient's wife called with concern she  was asking if the results of the [employer] swab testing came back . Informed that [employer] didn't complete the testing due to patient has Medicare & that Medicare does cover that type of testing. The Wife states "he's doing bad" Did inform I would send provider a message she Thanked Me  & asked that Provider please Call her ?

## 2022-02-09 NOTE — Telephone Encounter (Signed)
Provider was able to reach out to patient and his wife to discuss concerns over patient's mood.  Provider made adjustments to medications accordingly.  Patient to be placed on Seroquel 100 mg at bedtime, Klonopin 1 mg 2 times daily as needed, and Lamictal 25 mg for 14 days, followed by Lamictal 50 mg daily for mood stabilization.  Patient's medications to be prescribed to pharmacy of choice.

## 2022-02-09 NOTE — Progress Notes (Signed)
Provider was contacted by Eliezer Lofts, RMA in regards to patient's wife's concerns over patient's mood.  Provider was able to contact patient and his wife.  Patient's wife informed provider that patient still continues to experience mood swings as well as being angry most days.  Patient has been using his Klonopin more frequently to help manage his anxiety and irritability.  Provider recommended increasing patient's dosage of Seroquel from 50 mg to 100 mg at bedtime for the management of his mood.  Provider also recommended patient be placed on Lamictal for irritability.  Patient to take Lamictal 25 mg for 14 days, followed by 50 mg daily for the management of his irritability and for mood stabilization.  Patient was agreeable to recommendations.  Patient's medications to be e-prescribed to pharmacy of choice. ?

## 2022-02-14 ENCOUNTER — Telehealth (HOSPITAL_COMMUNITY): Payer: Self-pay | Admitting: Nurse Practitioner

## 2022-02-14 NOTE — BH Assessment (Signed)
Care Management - [facility] Follow Up Discharges  ? ?Writer attempted to make contact with patient today and was unsuccessful.  Writer left a HIPPA compliant voice message.  ? ?Per chart review, patient has a follow-up appointment at [facility] on 02/21/2022 with his established provider Link Snuffer, NP ?

## 2022-02-14 NOTE — BH Assessment (Signed)
Hunter Cook  [id number] ?

## 2022-02-21 ENCOUNTER — Telehealth (INDEPENDENT_AMBULATORY_CARE_PROVIDER_SITE_OTHER): Payer: Medicare Other | Admitting: Physician Assistant

## 2022-02-21 ENCOUNTER — Encounter (HOSPITAL_COMMUNITY): Payer: Self-pay | Admitting: Physician Assistant

## 2022-02-21 DIAGNOSIS — F4310 Post-traumatic stress disorder, unspecified: Secondary | ICD-10-CM | POA: Diagnosis not present

## 2022-02-21 DIAGNOSIS — F3132 Bipolar disorder, current episode depressed, moderate: Secondary | ICD-10-CM | POA: Diagnosis not present

## 2022-02-21 DIAGNOSIS — F411 Generalized anxiety disorder: Secondary | ICD-10-CM

## 2022-02-21 DIAGNOSIS — R4689 Other symptoms and signs involving appearance and behavior: Secondary | ICD-10-CM

## 2022-02-21 DIAGNOSIS — G479 Sleep disorder, unspecified: Secondary | ICD-10-CM

## 2022-02-21 MED ORDER — TRAZODONE HCL 50 MG PO TABS: 50.0000 mg | ORAL_TABLET | Freq: Every day | ORAL | 1 refills | Status: DC

## 2022-02-21 MED ORDER — HALOPERIDOL 5 MG PO TABS: 5.0000 mg | ORAL_TABLET | Freq: Two times a day (BID) | ORAL | 1 refills | Status: DC

## 2022-02-21 MED ORDER — QUETIAPINE FUMARATE 200 MG PO TABS: 200.0000 mg | ORAL_TABLET | Freq: Every day | ORAL | 1 refills | Status: DC

## 2022-02-21 MED ORDER — PRAZOSIN HCL 2 MG PO CAPS: 2.0000 mg | ORAL_CAPSULE | Freq: Every day | ORAL | 1 refills | Status: DC

## 2022-02-21 NOTE — Progress Notes (Addendum)
BH MD/PA/NP OP Progress Note ? ?Virtual Visit via Telephone Note ? ?I connected with Hunter Cook on 02/21/22 at  3:30 PM EDT by telephone and verified that I am speaking with the correct person using two identifiers. ? ?Location: ?Patient: Clinic ?Provider: Home ?  ?I discussed the limitations, risks, security and privacy concerns of performing an evaluation and management service by telephone and the availability of in person appointments. I also discussed with the patient that there may be a patient responsible charge related to this service. The patient expressed understanding and agreed to proceed. ? ?Follow Up Instructions: ? ?I discussed the assessment and treatment plan with the patient. The patient was provided an opportunity to ask questions and all were answered. The patient agreed with the plan and demonstrated an understanding of the instructions. ?  ?The patient was advised to call back or seek an in-person evaluation if the symptoms worsen or if the condition fails to improve as anticipated. ? ?I provided 21 minutes of non-face-to-face time during this encounter. ? ?Meta Hatchet, PA ? ? ?02/21/2022 3:42 PM ?Hunter Cook  ?MRN:  [id number] ? ?Chief Complaint:  ?Chief Complaint  ?Patient presents with  ? Follow-up  ? Medication Management  ? ?HPI:  ? ?Hunter Cook is a 26 year old male with a past psychiatric history significant for bipolar 1 disorder, generalized anxiety disorder, and PTSD who presents to [facility] via virtual telephone visit for follow-up and medication management.  Accompanied on the telephone visit call as the patient's wife Laveda Norman [place], [phone]).  Patient is currently being managed on the following medications: ? ?Clonazepam 0.5 mg 2 times daily as needed ?Seroquel 100 mg at bedtime ?Prazosin 2 mg at bedtime ?Trazodone 50 mg at bedtime ?Lamictal 25 mg daily to be transitioned to 50 mg daily ? ?Patient reports that he  has been more aggravated as of late.  Per patient's wife, patient has been agitated over everything and states that his mood has been contributing to their marriage falling apart.  She reports that the patient is unable to communicate what he is going through.  She reports that ever since patient has discontinued taking Abilify, other life has been hell.  Patient reports that everything has been getting worse and denies any positive changes in his mood. ? ?Patient endorses elevated depression.  He reports that all he wants to do is drink and be buzzed and not sober.  Patient's wife expresses concerns over his sobriety due to them having a newborn together.  Patient endorses anxiety and rates his anxiety at 10 out of 10.  Patient reports that he has recently lost another job due to his mental state and aggressive nature.  A PHQ-9 screen was performed with the patient scoring a 19.  A GAD-7 screen was also performed with the patient scoring a 19. ? ?Patient is alert and oriented x4, calm, cooperative, and fully engaged in conversation during the encounter.  Patient endorses "blah" mood.  Patient reports that he has been having too much on his mind and hates that he is unable to keep a job.  Patient also expresses concerns over being broke.  Patient denies suicidal or homicidal ideations.  He further denies auditory or visual hallucinations and does not appear to be responding to internal/external stimuli.  Patient endorses good sleep and receives on average 6 to 8 hours of sleep each night.  Patient endorses fair appetite and eats on average 1-2 meals per  day.  Patient endorses moderate alcohol consumption stating that he normally begins drinking in the morning.  Patient endorses tobacco use and smokes on average a pack per day.  Patient endorses illicit drug use in the form of marijuana stating that marijuana helps to keep him sane. ? ?Visit Diagnosis:  ?  ICD-10-CM   ?1. Aggressive behavior  R46.89 haloperidol  (HALDOL) 5 MG tablet  ?  ?2. Bipolar affective disorder, currently depressed, moderate (HCC)  F31.32 QUEtiapine (SEROQUEL) 200 MG tablet  ?  ?3. Generalized anxiety disorder  F41.1 QUEtiapine (SEROQUEL) 200 MG tablet  ?  ?4. Posttraumatic stress disorder  F43.10 prazosin (MINIPRESS) 2 MG capsule  ?  ?5. Sleep disturbances  G47.9 traZODone (DESYREL) 50 MG tablet  ?  ? ? ?Past Psychiatric History:  ?PTSD ?Generalized anxiety disorder ?Bipolar disorder ? ?Past Medical History:  ?Past Medical History:  ?Diagnosis Date  ? Bipolar 1 disorder (HCC)   ? Bipolar disorder (HCC)   ? Snake bite   ?  ?Past Surgical History:  ?Procedure Laterality Date  ? FOOT SURGERY Right 2015  ? crush injury - 2 screws and 1 plate surgically placed  ? FRACTURE SURGERY    ? HARDWARE REMOVAL Left 01/14/2017  ? Procedure: Left Ankle Syndesmotic Screw Removal;  Surgeon: Eldred MangesMark C Yates, MD;  Location: [facility] OR;  Service: Orthopedics;  Laterality: Left;  ? INCISION AND DRAINAGE Right 2012  ? snake bite to hand; had ~ 6 ORs related to this  ? INCISION AND DRAINAGE  2012  ? Multiple procedures due to snake bite  ? LAPAROTOMY N/A 12/20/2018  ? Procedure: EXPLORATORY LAPAROTOMY; SPLEENECTOMY;  Surgeon: Axel Filleramirez, Armando, MD;  [facility];  Service: General;  Laterality: N/A;  ? ORIF ANKLE FRACTURE Left 10/22/2016  ? Procedure: OPEN REDUCTION INTERNAL FIXATION (ORIF) BIMALLEOLAR ANKLE FRACTURE;  Surgeon: Eldred MangesMark C Yates, MD;  Location: [facility] OR;  Service: Orthopedics;  Laterality: Left;  ? ORIF ANKLE FRACTURE BIMALLEOLAR Left 10/22/2016  ? ORIF ANKLE FRACTURE BIMALLEOLAR Left 2017  ? ORIF ULNAR FRACTURE Left 12/22/2018  ? Procedure: OPEN REDUCTION INTERNAL FIXATION ULNAR FRACTURE;  Surgeon: Roby LoftsHaddix, Kevin P, MD;  Location: [facility] OR;  Service: Orthopedics;  Laterality: Left;  ? SKIN GRAFT  2012  ? "related to snake bite"  ? SKIN GRAFT  2013  ? when bitten by copperhead snake  ? TRACHEOSTOMY TUBE PLACEMENT N/A 01/13/2019  ? Procedure: TRACHEOSTOMY;  Surgeon: Violeta Gelinashompson,  Burke, MD;  [facility];  Service: General;  Laterality: N/A;  ? ? ?Family Psychiatric History:  ?Patient reports that his family never went to a doctor for mental health but he assumes that his father may have had some mental health issues. ? ?Family History:  ?Family History  ?Problem Relation Age of Onset  ? Heart attack Father   ? CAD Father   ? High blood pressure Father   ? High Cholesterol Father   ? High blood pressure Mother   ? High Cholesterol Mother   ? ? ?Social History:  ?Social History  ? ?Socioeconomic History  ? Marital status: Married  ?  Spouse name: Not on file  ? Number of children: Not on file  ? Years of education: Not on file  ? Highest education level: Not on file  ?Occupational History  ? Not on file  ?Tobacco Use  ? Smoking status: Every Day  ?  Packs/day: 0.50  ?  Years: 8.00  ?  Pack years: 4.00  ?  Types: Cigarettes  ?  Smokeless tobacco: Never  ? Tobacco comments:  ?  quit using chewing tobacco  ?Vaping Use  ? Vaping Use: Former  ?Substance and Sexual Activity  ? Alcohol use: Yes  ?  Comment: (father unclear of true amt - "a lot" socially 3-4 times a week  ? Drug use: Yes  ?  Types: "Crack" cocaine, Marijuana  ?  Comment: rarely  ? Sexual activity: Yes  ?Other Topics Concern  ? Not on file  ?Social History Narrative  ? ** Merged History Encounter **  ?    ? ?Social Determinants of Health  ? ?Financial Resource Strain: Not on file  ?Food Insecurity: Not on file  ?Transportation Needs: Not on file  ?Physical Activity: Not on file  ?Stress: Not on file  ?Social Connections: Not on file  ? ? ?Allergies:  ?Allergies  ?Allergen Reactions  ? Lamictal [Lamotrigine] Hives  ? ? ?Metabolic Disorder Labs: ?No results found for: HGBA1C, MPG ?No results found for: PROLACTIN ?Lab Results  ?Component Value Date  ? TRIG 328 (H) 12/20/2018  ? ?Lab Results  ?Component Value Date  ? TSH 1.690 04/17/2021  ? ? ?Therapeutic Level Labs: ?No results found for: LITHIUM ?Lab Results  ?Component Value Date  ?  VALPROATE 70 04/17/2021  ? ?No components found for:  CBMZ ? ?Current Medications: ?Current Outpatient Medications  ?Medication Sig Dispense Refill  ? haloperidol (HALDOL) 5 MG tablet Take 1 tablet (5 mg total)

## 2022-02-23 ENCOUNTER — Emergency Department (HOSPITAL_BASED_OUTPATIENT_CLINIC_OR_DEPARTMENT_OTHER)
Admission: EM | Admit: 2022-02-23 | Discharge: 2022-02-23 | Disposition: A | Discharge status: Other Acute Inpatient Hospital | Payer: Medicare Other | Source: Home / Self Care | Attending: Student | Admitting: Student

## 2022-02-23 ENCOUNTER — Other Ambulatory Visit: Payer: Self-pay

## 2022-02-23 ENCOUNTER — Emergency Department (HOSPITAL_COMMUNITY)
Admission: EM | Admit: 2022-02-23 | Discharge: 2022-02-23 | Disposition: A | Discharge status: Home / Self Care | Payer: Medicare Other | Attending: Emergency Medicine | Admitting: Emergency Medicine

## 2022-02-23 ENCOUNTER — Ambulatory Visit
Admission: EM | Admit: 2022-02-23 | Discharge: 2022-02-23 | Disposition: A | Discharge status: Home / Self Care | Payer: Medicare Other

## 2022-02-23 ENCOUNTER — Emergency Department (HOSPITAL_COMMUNITY): Payer: Medicare Other

## 2022-02-23 ENCOUNTER — Telehealth (HOSPITAL_COMMUNITY): Payer: Self-pay | Admitting: *Deleted

## 2022-02-23 DIAGNOSIS — R110 Nausea: Secondary | ICD-10-CM | POA: Insufficient documentation

## 2022-02-23 DIAGNOSIS — R21 Rash and other nonspecific skin eruption: Secondary | ICD-10-CM | POA: Insufficient documentation

## 2022-02-23 DIAGNOSIS — T426X5A Adverse effect of other antiepileptic and sedative-hypnotic drugs, initial encounter: Secondary | ICD-10-CM | POA: Insufficient documentation

## 2022-02-23 DIAGNOSIS — T887XXA Unspecified adverse effect of drug or medicament, initial encounter: Secondary | ICD-10-CM | POA: Insufficient documentation

## 2022-02-23 DIAGNOSIS — R0602 Shortness of breath: Secondary | ICD-10-CM | POA: Insufficient documentation

## 2022-02-23 DIAGNOSIS — F17210 Nicotine dependence, cigarettes, uncomplicated: Secondary | ICD-10-CM | POA: Insufficient documentation

## 2022-02-23 DIAGNOSIS — L270 Generalized skin eruption due to drugs and medicaments taken internally: Secondary | ICD-10-CM | POA: Diagnosis not present

## 2022-02-23 DIAGNOSIS — Z5321 Procedure and treatment not carried out due to patient leaving prior to being seen by health care provider: Secondary | ICD-10-CM | POA: Diagnosis not present

## 2022-02-23 LAB — COMPREHENSIVE METABOLIC PANEL
ALT: 40 U/L (ref 0–44)
AST: 29 U/L (ref 15–41)
Albumin: 4.4 g/dL (ref 3.5–5.0)
Alkaline Phosphatase: 73 U/L (ref 38–126)
Anion gap: 11 (ref 5–15)
BUN: 11 mg/dL (ref 6–20)
CO2: 21 mmol/L — ABNORMAL LOW (ref 22–32)
Calcium: 9.5 mg/dL (ref 8.9–10.3)
Chloride: 106 mmol/L (ref 98–111)
Creatinine, Ser: 0.96 mg/dL (ref 0.61–1.24)
GFR, Estimated: >60 mL/min (ref >60)
Glucose, Bld: 103 mg/dL — ABNORMAL HIGH (ref 70–99)
Potassium: 4.3 mmol/L (ref 3.5–5.1)
Sodium: 138 mmol/L (ref 135–145)
Total Bilirubin: 0.6 mg/dL (ref 0.3–1.2)
Total Protein: 7.7 g/dL (ref 6.5–8.1)

## 2022-02-23 LAB — CBC WITH DIFFERENTIAL/PLATELET
Abs Immature Granulocytes: 0.04 10*3/uL (ref 0.00–0.07)
Basophils Absolute: 0.1 10*3/uL (ref 0.0–0.1)
Basophils Relative: 1 %
Eosinophils Absolute: 0.2 10*3/uL (ref 0.0–0.5)
Eosinophils Relative: 2 %
HCT: 49.5 % (ref 39.0–52.0)
Hemoglobin: 16.2 g/dL (ref 13.0–17.0)
Immature Granulocytes: 0 %
Lymphocytes Relative: 22 %
Lymphs Abs: 2.4 10*3/uL (ref 0.7–4.0)
MCH: 29.9 pg (ref 26.0–34.0)
MCHC: 32.7 g/dL (ref 30.0–36.0)
MCV: 91.3 fL (ref 80.0–100.0)
Monocytes Absolute: 0.8 10*3/uL (ref 0.1–1.0)
Monocytes Relative: 7 %
Neutro Abs: 7.5 10*3/uL (ref 1.7–7.7)
Neutrophils Relative %: 68 %
Platelets: 543 10*3/uL — ABNORMAL HIGH (ref 150–400)
RBC: 5.42 MIL/uL (ref 4.22–5.81)
RDW: 14.6 % (ref 11.5–15.5)
WBC: 11 10*3/uL — ABNORMAL HIGH (ref 4.0–10.5)
nRBC: 0 % (ref 0.0–0.2)

## 2022-02-23 LAB — SEDIMENTATION RATE: Sed Rate: 7 mm/hr (ref 0–16)

## 2022-02-23 MED ORDER — LIDOCAINE VISCOUS HCL 2 % MT SOLN
15.0000 mL | Freq: Once | OROMUCOSAL | Status: AC
  Administered 2022-02-23: 15 mL via OROMUCOSAL
  Filled 2022-02-23: qty 15

## 2022-02-23 NOTE — Telephone Encounter (Signed)
Call from patient stating he has developed a rash and is on his way to the ED and is going to stop taking the Lamictal now. Will inform Eddie PA now of the development. ?

## 2022-02-23 NOTE — ED Triage Notes (Signed)
Patient complains of pruritic rash on bilateral hands and feet that appeared three days ago. Patient speaking in compete sentences, is alert, oriented, and in no apparent distress at this time. ?

## 2022-02-23 NOTE — ED Notes (Signed)
Pt left emergency department  

## 2022-02-23 NOTE — ED Triage Notes (Signed)
First contact with patient. Patient arrived via triage from home with complaints of allergic reaction to Lamictal - swelling and hives noted to body. Pt is A&OX 4. Respirations even/unlabored - speaking in full sentences - airway patient. ? ?

## 2022-02-23 NOTE — Discharge Instructions (Addendum)
You currently have a rash that is concerning for possible developing Stevens-Johnson syndrome.  I have called over to [facility] and they are expecting your arrival. ? ?Relevant labs include a white blood cell count of 11, platelets 543, normal LFTs, normal sed rate, normal creatinine, no eosinophilia. ?
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
## 2022-02-23 NOTE — ED Notes (Signed)
RT assessed patient in triage. Stated he has been swelling and broken out in hives x3 days, but feels more swollen through his throat and lips today. Able to speak in full sentences, vitals WNL. RN to triage ?

## 2022-02-23 NOTE — ED Notes (Addendum)
Pt transferred to [facility] Adult ED.  Report called to charge RN, Morrie Sheldon, accepting physician Dr Shirline Frees.  Pt advised to not stop or eat on the way. ?
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
## 2022-02-23 NOTE — ED Provider Triage Note (Addendum)
Emergency Medicine Provider Triage Evaluation Note ? ?Hunter Cook , a 26 y.o. male  was evaluated in triage.  Pt complains of rash to bilateral ankles, bilateral hands and wrists, shoulders, mouth and lips.  Patient states he began taking lamotrigine 2 weeks ago due to "anger" issues.  Patient states that 2 to 3 days ago he began experiencing itchy, burning, stinging rash on the areas described above.  Patient presented to urgent care earlier today and was sent over to the ED for further evaluation due to concern of SJS.  Patient endorsing shortness of breath, nausea.  Patient denies any chest pain, lightheadedness, dizziness, weakness, trouble swallowing.  Negative Nikolsky sign. ? ?Review of Systems  ?Positive: Shortness of breath, nausea ?Negative: Chest pain, lightheadedness, dizziness, weakness, trouble swallowing ? ?Physical Exam  ?BP (!) 152/115 (BP Location: Right Arm)   Pulse (!) 109   Temp 98.6 ?F (37 ?C) (Oral)   Resp 16   SpO2 98%  ?Gen:   Awake, no distress   ?Resp:  Normal effort ?MSK:   Moves extremities without difficulty  ?Other:  Clear to auscultation bilaterally.  Patient has erythematous rash noted to bilateral ankles, bilateral wrist and hands, shoulders, mucosal involvement of mouth. ? ?Medical Decision Making  ?Medically screening exam initiated at 12:07 PM.  Appropriate orders placed.  Hunter Cook was informed that the remainder of the evaluation will be completed by another provider, this initial triage assessment does not replace that evaluation, and the importance of remaining in the ED until their evaluation is complete. ? ? ?  ? ? ?  ?Al Decant, PA-C ?02/23/22 1209 ? ?

## 2022-02-23 NOTE — ED Triage Notes (Signed)
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 Rash is on hands, arms, legs, feet, around mouth and lips.  ?

## 2022-02-26 ENCOUNTER — Ambulatory Visit: Payer: Medicare Other | Admitting: Nurse Practitioner

## 2022-02-27 ENCOUNTER — Ambulatory Visit (INDEPENDENT_AMBULATORY_CARE_PROVIDER_SITE_OTHER): Payer: Medicare Other | Admitting: Licensed Clinical Social Worker

## 2022-02-27 ENCOUNTER — Encounter (HOSPITAL_COMMUNITY): Payer: Self-pay

## 2022-02-27 DIAGNOSIS — F4310 Post-traumatic stress disorder, unspecified: Secondary | ICD-10-CM

## 2022-02-27 DIAGNOSIS — F3132 Bipolar disorder, current episode depressed, moderate: Secondary | ICD-10-CM | POA: Diagnosis not present
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
## 2022-02-27 NOTE — Telephone Encounter (Signed)
Provider was contacted by Orpah Clinton. Reola Calkins, RN regarding patient's reaction to medication. Provider to reach out to patient. Patient has a follow up appointment scheduled for 04/04/2022.

## 2022-02-27 NOTE — Plan of Care (Signed)
?  Problem: Bipolar Disorder CCP Problem  1 bipolar affect disorder  ?Goal:  walk 3 x weekly  ?Outcome: Not Progressing ?Goal: LTG: Stabilize mood and increase goal-directed behavior: Input needed on appropriate metric Decrease PHQ-9 and GAD-7 below 10  ?Outcome: Progressing ?Goal: STG: Hunter Cook WILL COOPERATE WITH A PSYCHIATRIC EVALUATION ON (DATE) AND DURING ALL FOLLOW-UP VISITS ?Outcome: Completed/Met ?Goal: STG: Hunter Cook WILL IDENTIFY COGNITIVE PATTERNS AND BELIEFS THAT INTERFERE WITH THERAPY ?Outcome: Not Progressing ?Goal: STG: Hunter Cook WILL ATTEND AT LEAST 80% OF SCHEDULED FOLLOW-UP MEDICATION MANAGEMENT APPOINTMENTS ?Outcome: Progressing ?  ?Problem: PTSD-Trauma Disorder CCP Problem  1 PTSD  ?Goal: STG: Hunter Cook WILL PRACTICE EMOTION REGULATION SKILLS 3 PER WEEK FOR THE NEXT 10 WEEKS ?Outcome: Not Progressing ?Goal: STG: Increase participation in previously avoided activities such as listening to music  ?Outcome: Not Progressing ?Goal: STG: Hunter Cook WILL PRACTICE CONFLICT RESOLUTION SKILLS AT LEAST 3 PER WEEK FOR THE NEXT 10  WEEKS ?Outcome: Not Progressing ?  ?

## 2022-03-08 ENCOUNTER — Ambulatory Visit (HOSPITAL_COMMUNITY): Payer: Medicare Other | Admitting: Licensed Clinical Social Worker

## 2022-03-08 ENCOUNTER — Telehealth (HOSPITAL_COMMUNITY): Payer: Self-pay | Admitting: Physician Assistant

## 2022-03-08 ENCOUNTER — Other Ambulatory Visit (HOSPITAL_COMMUNITY): Payer: Self-pay | Admitting: Physician Assistant

## 2022-03-08 DIAGNOSIS — F3132 Bipolar disorder, current episode depressed, moderate: Secondary | ICD-10-CM

## 2022-03-08 DIAGNOSIS — F411 Generalized anxiety disorder: Secondary | ICD-10-CM

## 2022-03-08 MED ORDER — QUETIAPINE FUMARATE 400 MG PO TABS: 400.0000 mg | ORAL_TABLET | Freq: Every day | ORAL | 1 refills | Status: DC

## 2022-03-08 NOTE — Progress Notes (Signed)
Provider contacted patient to go over [facility] testing results.  During that discussion, patient reports that he was recently admitted to [facility] after presenting to the ED with a chief complaint of allergic reaction from Lamictal.  Patient reports that he was discharged from [name] psychiatry with the following medications: ? ?Klonopin 1 mg 3 times daily as needed ?Haldol 5 mg 2 times daily ?Trazodone 80 mg at bedtime ?Seroquel 200 mg at bedtime ? ?Patient continues to experience anger and depression every day.  He reports that his anxiety has been manageable and reports that his clonazepam has been helpful. ? 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  Provider recommended that patient discontinue Haldol but before discontinuation, patient was advised to taper off medication.  Patient was instructed to take Haldol 5 mg daily for 1 week followed by Haldol 2.5 mg for 2 more weeks before discontinuing.  Patient vocalized understanding.  Provider recommended patient increase his Seroquel to 300 mg at bedtime for 4 days then continue taking Seroquel 400 mg at bedtime for the management of his aggressive mood and bipolar disorder.  Patient was agreeable to recommendation.  Provider to prescribe patient's medication to pharmacy of choice. ? ? ?

## 2022-03-08 NOTE — Telephone Encounter (Signed)
Provider was able to contact patient to discuss [facility] testing results.  Provider made adjustments to patient's medications accordingly.  Patient has a follow-up appointment scheduled for 04/04/2022.

## 2022-03-15 ENCOUNTER — Other Ambulatory Visit (HOSPITAL_COMMUNITY): Payer: Self-pay | Admitting: Physician Assistant

## 2022-03-15 DIAGNOSIS — F411 Generalized anxiety disorder: Secondary | ICD-10-CM

## 2022-03-21 NOTE — Telephone Encounter (Signed)
Provider to adjust patient's clonazepam dosage.  Patient was given clonazepam 1 mg 3 times daily as needed during his stay in a psychiatry facility at [facility]  Provider to adjust patient's clonazepam from 1 mg to 0.5 mg 3 times daily as needed. ?

## 2022-03-29 ENCOUNTER — Ambulatory Visit (HOSPITAL_COMMUNITY): Payer: Medicare Other | Admitting: Licensed Clinical Social Worker

## 2022-04-04 ENCOUNTER — Telehealth (INDEPENDENT_AMBULATORY_CARE_PROVIDER_SITE_OTHER): Payer: Medicare Other | Admitting: Physician Assistant

## 2022-04-04 DIAGNOSIS — F411 Generalized anxiety disorder: Secondary | ICD-10-CM | POA: Diagnosis not present

## 2022-04-04 DIAGNOSIS — F3132 Bipolar disorder, current episode depressed, moderate: Secondary | ICD-10-CM | POA: Diagnosis not present

## 2022-04-04 DIAGNOSIS — F4310 Post-traumatic stress disorder, unspecified: Secondary | ICD-10-CM

## 2022-04-05 ENCOUNTER — Encounter (HOSPITAL_COMMUNITY): Payer: Self-pay | Admitting: Physician Assistant

## 2022-04-05 MED ORDER — PRAZOSIN HCL 2 MG PO CAPS
2.0000 mg | ORAL_CAPSULE | Freq: Every day | ORAL | 1 refills | Status: DC
Start: 2022-04-05 — End: 2022-06-06

## 2022-04-05 MED ORDER — QUETIAPINE FUMARATE 400 MG PO TABS: 400.0000 mg | ORAL_TABLET | Freq: Every day | ORAL | 1 refills | Status: DC

## 2022-04-05 MED ORDER — CLONAZEPAM 0.5 MG PO TABS: 0.5000 mg | ORAL_TABLET | Freq: Three times a day (TID) | ORAL | 0 refills | Status: DC | PRN

## 2022-04-05 NOTE — Progress Notes (Signed)
BH MD/PA/NP OP Progress Note ? ?Virtual Visit via Telephone Note ? ?I connected with Hunter Cook on 04/04/22 at  3:00 PM EDT by telephone and verified that I am speaking with the correct person using two identifiers. ? ?Location: ?Patient: Clinic ?Provider: Home ?  ?I discussed the limitations, risks, security and privacy concerns of performing an evaluation and management service by telephone and the availability of in person appointments. I also discussed with the patient that there may be a patient responsible charge related to this service. The patient expressed understanding and agreed to proceed. ? ?Follow Up Instructions: ? ?I discussed the assessment and treatment plan with the patient. The patient was provided an opportunity to ask questions and all were answered. The patient agreed with the plan and demonstrated an understanding of the instructions. ?  ?The patient was advised to call back or seek an in-person evaluation if the symptoms worsen or if the condition fails to improve as anticipated. ? ?I provided 18 minutes of non-face-to-face time during this encounter. ? ?Meta Hatchet, PA ? ? ?04/04/2022 3:42 PM ?Hunter Cook  ?MRN:  [id number] ? ?Chief Complaint:  ?No chief complaint on file. ? ?HPI:  ? ?Hunter Cook is a 26 year old male with a past psychiatric history significant for bipolar 1 disorder, generalized anxiety disorder, and PTSD who presents to [facility] via virtual telephone visit for follow-up and medication management. Patient is currently being managed on the following medications: ? ?Clonazepam 0.5 mg 3  times daily as needed ?Seroquel 400 mg at bedtime ?Prazosin 2 mg at bedtime ?Trazodone 50 mg at bedtime ? ?Patient reports that his Seroquel still has not been effective in managing his mood.  He does report that he has been able to receive improved sleep since taking Seroquel.  Patient continues to endorse anger outbursts and states  they occur at random anytime of the day.  Patient endorses depressive episodes roughly 4 times a week.  Patient states that he has been doing a little better now that he has been working more frequently.  Patient endorses anxiety and rates his anxiety an 8 out of 10.  Patient endorses the following stressors: bills, financial instability, and feeling unprepared.  Patient reports that his child birthday is coming up and he and his wife plan on doing a party; however, patient does not feel like he has enough to be able to do a party for his child.  A PHQ-9 screen was performed with the patient scoring a 10.  A GAD-7 screen was also performed with the patient scoring a 16. ? ?Patient is alert and oriented x4, calm, cooperative, and fully engaged in conversation during the encounter.  Patient endorses "blah" mood and states that he is just "here."  Patient denies suicidal or homicidal ideations.  He further denies auditory or visual hallucinations and does not appear to be responding to internal/external stimuli.  Patient endorses good sleep and receives on average 8 hours of sleep each night.  Patient endorses good appetite and eats on average 2 meals per day.  Patient denies alcohol consumption.  Patient endorses tobacco use and smokes on average a pack per day.  Patient endorses illicit drug use in the form of marijuana. ? ?Visit Diagnosis:  ?  ICD-10-CM   ?1. Bipolar affective disorder, currently depressed, moderate (HCC)  F31.32   ?  ? ? ?Past Psychiatric History:  ?PTSD ?Generalized anxiety disorder ?Bipolar disorder ? ?Past Medical History:  ?  Past Medical History:  ?Diagnosis Date  ? Bipolar 1 disorder (HCC)   ? Bipolar disorder (HCC)   ? Snake bite   ?  ?Past Surgical History:  ?Procedure Laterality Date  ? FOOT SURGERY Right 2015  ? crush injury - 2 screws and 1 plate surgically placed  ? FRACTURE SURGERY    ? HARDWARE REMOVAL Left 01/14/2017  ? Procedure: Left Ankle Syndesmotic Screw Removal;  Surgeon: Eldred Manges, MD;  Location: [facility] OR;  Service: Orthopedics;  Laterality: Left;  ? INCISION AND DRAINAGE Right 2012  ? snake bite to hand; had ~ 6 ORs related to this  ? INCISION AND DRAINAGE  2012  ? Multiple procedures due to snake bite  ? LAPAROTOMY N/A 12/20/2018  ? Procedure: EXPLORATORY LAPAROTOMY; SPLEENECTOMY;  Surgeon: Axel Filler, MD;  Location: [facility] OR;  Service: General;  Laterality: N/A;  ? ORIF ANKLE FRACTURE Left 10/22/2016  ? Procedure: OPEN REDUCTION INTERNAL FIXATION (ORIF) BIMALLEOLAR ANKLE FRACTURE;  Surgeon: Eldred Manges, MD;  Location: [facility] OR;  Service: Orthopedics;  Laterality: Left;  ? ORIF ANKLE FRACTURE BIMALLEOLAR Left 10/22/2016  ? ORIF ANKLE FRACTURE BIMALLEOLAR Left 2017  ? ORIF ULNAR FRACTURE Left 12/22/2018  ? Procedure: OPEN REDUCTION INTERNAL FIXATION ULNAR FRACTURE;  Surgeon: Roby Lofts, MD;  Location: [facility] OR;  Service: Orthopedics;  Laterality: Left;  ? SKIN GRAFT  2012  ? "related to snake bite"  ? SKIN GRAFT  2013  ? when bitten by copperhead snake  ? TRACHEOSTOMY TUBE PLACEMENT N/A 01/13/2019  ? Procedure: TRACHEOSTOMY;  Surgeon: Violeta Gelinas, MD;  Location: [facility] OR;  Service: General;  Laterality: N/A;  ? ? ?Family Psychiatric History:  ?Patient reports that his family never went to a doctor for mental health but he assumes that his father may have had some mental health issues. ? ?Family History:  ?Family History  ?Problem Relation Age of Onset  ? Heart attack Father   ? CAD Father   ? High blood pressure Father   ? High Cholesterol Father   ? High blood pressure Mother   ? High Cholesterol Mother   ? ? ?Social History:  ?Social History  ? ?Socioeconomic History  ? Marital status: Married  ?  Spouse name: Not on file  ? Number of children: Not on file  ? Years of education: Not on file  ? Highest education level: Not on file  ?Occupational History  ? Not on file  ?Tobacco Use  ? Smoking status: Every Day  ?  Packs/day: 0.50  ?  Years: 8.00  ?  Pack years: 4.00  ?  Types:  Cigarettes  ? Smokeless tobacco: Never  ? Tobacco comments:  ?  quit using chewing tobacco  ?Vaping Use  ? Vaping Use: Former  ?Substance and Sexual Activity  ? Alcohol use: Yes  ?  Comment: (father unclear of true amt - "a lot" socially 3-4 times a week  ? Drug use: Yes  ?  Types: "Crack" cocaine, Marijuana  ?  Comment: rarely  ? Sexual activity: Yes  ?Other Topics Concern  ? Not on file  ?Social History Narrative  ? ** Merged History Encounter **  ?    ? ?Social Determinants of Health  ? ?Financial Resource Strain: Not on file  ?Food Insecurity: Not on file  ?Transportation Needs: Not on file  ?Physical Activity: Not on file  ?Stress: Not on file  ?Social Connections: Not on file  ? ? ?Allergies:  ?Allergies  ?  Allergen Reactions  ? Lamictal [Lamotrigine] Hives  ? ? ?Metabolic Disorder Labs: ?No results found for: HGBA1C, MPG ?No results found for: PROLACTIN ?Lab Results  ?Component Value Date  ? TRIG 328 (H) 12/20/2018  ? ?Lab Results  ?Component Value Date  ? TSH 1.690 04/17/2021  ? ? ?Therapeutic Level Labs: ?No results found for: LITHIUM ?Lab Results  ?Component Value Date  ? VALPROATE 70 04/17/2021  ? ?No components found for:  CBMZ ? ?Current Medications: ?Current Outpatient Medications  ?Medication Sig Dispense Refill  ? acetaminophen (TYLENOL) 325 MG tablet Take 2 tablets (650 mg total) by mouth every 6 (six) hours as needed for mild pain or fever (Fever >101.5).    ? benzonatate (TESSALON) 200 MG capsule Take 1 capsule (200 mg total) by mouth 2 (two) times daily as needed for cough. 20 capsule 0  ? clonazePAM (KLONOPIN) 0.5 MG tablet Take 1 tablet (0.5 mg total) by mouth 3 (three) times daily as needed for anxiety. 90 tablet 0  ? prazosin (MINIPRESS) 2 MG capsule Take 1 capsule (2 mg total) by mouth at bedtime. 30 capsule 1  ? QUEtiapine (SEROQUEL) 400 MG tablet Take 1 tablet (400 mg total) by mouth at bedtime. 30 tablet 1  ? traZODone (DESYREL) 50 MG tablet Take 1 tablet (50 mg total) by mouth at bedtime.  30 tablet 1  ? ?No current facility-administered medications for this visit.  ? ? ? ?Musculoskeletal: ?Strength & Muscle Tone: Unable to assess due to telemedicine visit ?Gait & Station: Unable to assess due

## 2022-05-15 ENCOUNTER — Telehealth (HOSPITAL_COMMUNITY): Payer: Self-pay | Admitting: *Deleted

## 2022-05-15 NOTE — Telephone Encounter (Signed)
PATIENT CALLED REQUESTED REFILL ON :  clonazePAM (KLONOPIN) 0.5 MG tablet Take 1 tablet (0.5 mg total) by mouth 3 (three) times daily as needed for anxiety

## 2022-05-18 ENCOUNTER — Other Ambulatory Visit (HOSPITAL_COMMUNITY): Payer: Self-pay | Admitting: Physician Assistant

## 2022-05-18 DIAGNOSIS — F411 Generalized anxiety disorder: Secondary | ICD-10-CM

## 2022-05-18 MED ORDER — CLONAZEPAM 0.5 MG PO TABS: 0.5000 mg | ORAL_TABLET | Freq: Three times a day (TID) | ORAL | 0 refills | Status: DC | PRN

## 2022-05-18 NOTE — Progress Notes (Signed)
Provider was contacted by Rushie Chestnut, RMA regarding patient's medication refill request.  Patient's medications to be e-prescribed to pharmacy of choice.

## 2022-06-06 ENCOUNTER — Telehealth (INDEPENDENT_AMBULATORY_CARE_PROVIDER_SITE_OTHER): Payer: Medicare Other | Admitting: Student in an Organized Health Care Education/Training Program

## 2022-06-06 DIAGNOSIS — F411 Generalized anxiety disorder: Secondary | ICD-10-CM | POA: Diagnosis not present

## 2022-06-06 DIAGNOSIS — F4310 Post-traumatic stress disorder, unspecified: Secondary | ICD-10-CM

## 2022-06-06 DIAGNOSIS — F3132 Bipolar disorder, current episode depressed, moderate: Secondary | ICD-10-CM

## 2022-06-06 MED ORDER — RISPERIDONE 1 MG PO TABS: 1.0000 mg | ORAL_TABLET | Freq: Two times a day (BID) | ORAL | 0 refills | Status: DC

## 2022-06-06 MED ORDER — CLONAZEPAM 0.5 MG PO TABS
0.5000 mg | ORAL_TABLET | Freq: Three times a day (TID) | ORAL | 0 refills | Status: DC | PRN
Start: 2022-06-06 — End: 2022-08-15

## 2022-06-06 MED ORDER — PRAZOSIN HCL 1 MG PO CAPS: 1.0000 mg | ORAL_CAPSULE | Freq: Every day | ORAL | 0 refills | Status: DC

## 2022-06-06 NOTE — Progress Notes (Signed)
Virtual Visit via Video Note  I connected with Hunter Cook on 06/06/22 at  3:00 PM EDT by a video enabled telemedicine application and verified that I am speaking with the correct person using two identifiers.  Location: Patient: Work Provider: [facility]   I discussed the limitations of evaluation and management by telemedicine and the availability of in person appointments. The patient expressed understanding and agreed to proceed.  History of Present Illness:  Hunter Cook is a 26 year old male with a past psychiatric history significant for bipolar 1 disorder, generalized anxiety disorder, and PTSD who presents to [facility] via virtual telephone visit for follow-up and medication management.   He reports that he stopped taking his Seroquel because it was not controlling his symptoms.  He reports that he is still having anger outbursts and issues.  He states so far he is managed to not be fired but he wants to get started on some medication because he wants to avoid this.  Discussed medications he been on previously.  He reports he was taking Abilify but this was stopped because it was no longer effective.  He states prior to this he had been taking Depakote since the third grade and that while this controlled his symptoms for a while eventually it to stop working which is why he was switched.  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  Discussed trialing Risperdal as this can help with mood stability and agitation and he was agreeable to this.  Discussed restarting prazosin for his sleep and he was agreeable to this.  He reports that Klonopin continues to be helpful in taking the edge off of his anger.  He reports no SI, HI, or AVH.  He reports while he is having anger outbursts he is not having any overall mood instability.  He reports his sleep is fair.  He reports his appetite  is good.  He reports no other concerns at present.    Observations/Objective:   Psychiatric Specialty Exam:   Review of Systems  Respiratory:  Negative for shortness of breath.   Cardiovascular:  Negative for chest pain.  Gastrointestinal:  Negative for abdominal pain, constipation, diarrhea, nausea and vomiting.  Neurological:  Negative for dizziness, weakness and headaches.  Psychiatric/Behavioral:  Positive for agitation. Negative for hallucinations, self-injury and suicidal ideas. The patient is nervous/anxious.     There were no vitals taken for this visit.There is no height or weight on file to calculate BMI.  General Appearance: Casual and Fairly Groomed  Eye Contact:  Good  Speech:  Clear and Coherent and Normal Rate  Volume:  Normal  Mood:   "ok"  Affect:  Appropriate and Congruent  Thought Process:  Coherent and Goal Directed  Orientation:  Full (Time, Place, and Person)  Thought Content:  Logical  Suicidal Thoughts:  No  Homicidal Thoughts:  No  Memory:  Immediate;   Good Recent;   Good  Judgement:  Fair  Insight:  Fair  Psychomotor Activity:  Normal  Concentration:  Concentration: Good and Attention Span: Good  Recall:  Good  Fund of Knowledge:  Good  Language:  Good  Akathisia:  Negative  Handed:  Right  AIMS (if indicated):     Assets:  Communication Skills Desire for Improvement Resilience Social Support  ADL's:  Intact  Cognition:  WNL  Sleep:   Good    Assessment and Plan:  Hunter Cook is a 26 year old male with a past psychiatric history significant for bipolar 1 disorder, generalized anxiety disorder, and PTSD who presents to [facility] via virtual telephone visit for follow-up and medication management.   Therin stopped taking his medications due to ineffectiveness.  Given he has taken Abilify, Depakote, Seroquel, and had an allergic reaction to Lamictal we will trial Risperdal.  We will restart  Prazosin and continue his Klonopin.  He will have a follow up appointment in 4 weeks.   1. Bipolar affective disorder, currently depressed, moderate (HCC)   -Start Risperdal 0.5 mg BID for 7 days then increase to 1 mg BID. Dispense 60 tablets with 0 refills   2. Generalized anxiety disorder   -Start Risperdal 0.5 mg BID for 7 days then increase to 1 mg BID. Dispense 60 tablets with 0 refills -Continue ClonazePAM (KLONOPIN) 0.5 MG tablet; Take 1 tablet (0.5 mg total) by mouth 3 (three) times daily as needed for anxiety.  Dispense: 90 tablet; Refill: 0   3. Posttraumatic stress disorder   -Restart Prazosin (MINIPRESS) 1 MG capsule QHS.  Dispense: 30 capsule; Refill: 0   Follow Up Instructions:    I discussed the assessment and treatment plan with the patient. The patient was provided an opportunity to ask questions and all were answered. The patient agreed with the plan and demonstrated an understanding of the instructions.   The patient was advised to call back or seek an in-person evaluation if the symptoms worsen or if the condition fails to improve as anticipated.  I provided 15 minutes of non-face-to-face time during this encounter.   Lauro Franklin, MD

## 2022-07-10 ENCOUNTER — Telehealth (HOSPITAL_COMMUNITY): Payer: Medicare Other | Admitting: Student in an Organized Health Care Education/Training Program

## 2022-07-10 ENCOUNTER — Telehealth (HOSPITAL_COMMUNITY): Payer: Self-pay | Admitting: Student in an Organized Health Care Education/Training Program

## 2022-07-10 ENCOUNTER — Encounter (HOSPITAL_COMMUNITY): Payer: Self-pay

## 2022-07-10 NOTE — Telephone Encounter (Signed)
Clld pt to reschedule... as Dr. Arna Snipe is out ill. Pt asked about Link Snuffer I told him Eddie's next availability as well as his walk in times..Pt became very irate using profanity & screaming. I ended the call as I was unable to communicate with pt

## 2022-08-02 ENCOUNTER — Telehealth: Payer: Self-pay | Admitting: Nurse Practitioner

## 2022-08-02 ENCOUNTER — Other Ambulatory Visit: Payer: Self-pay

## 2022-08-02 DIAGNOSIS — F3132 Bipolar disorder, current episode depressed, moderate: Secondary | ICD-10-CM

## 2022-08-02 DIAGNOSIS — F4310 Post-traumatic stress disorder, unspecified: Secondary | ICD-10-CM

## 2022-08-02 NOTE — Telephone Encounter (Signed)
Called pt he is advised of his referral that was placed °

## 2022-08-02 NOTE — Telephone Encounter (Signed)
Patient is requesting new referral to behavioral health because current one isn't helping him. He wants to go to [facility] [address]

## 2022-08-13 ENCOUNTER — Other Ambulatory Visit (HOSPITAL_COMMUNITY): Payer: Self-pay | Admitting: Physician Assistant

## 2022-08-13 DIAGNOSIS — F411 Generalized anxiety disorder: Secondary | ICD-10-CM

## 2022-08-20 ENCOUNTER — Encounter (HOSPITAL_COMMUNITY): Payer: Self-pay | Admitting: Student in an Organized Health Care Education/Training Program

## 2022-08-20 ENCOUNTER — Ambulatory Visit (HOSPITAL_BASED_OUTPATIENT_CLINIC_OR_DEPARTMENT_OTHER): Payer: Medicare Other | Admitting: Student in an Organized Health Care Education/Training Program

## 2022-08-20 DIAGNOSIS — F4310 Post-traumatic stress disorder, unspecified: Secondary | ICD-10-CM

## 2022-08-20 DIAGNOSIS — R4689 Other symptoms and signs involving appearance and behavior: Secondary | ICD-10-CM | POA: Diagnosis not present

## 2022-08-20 DIAGNOSIS — F411 Generalized anxiety disorder: Secondary | ICD-10-CM

## 2022-08-20 DIAGNOSIS — F3132 Bipolar disorder, current episode depressed, moderate: Secondary | ICD-10-CM

## 2022-08-20 DIAGNOSIS — G479 Sleep disorder, unspecified: Secondary | ICD-10-CM

## 2022-08-20 MED ORDER — DIVALPROEX SODIUM 500 MG PO DR TAB: 500.0000 mg | DELAYED_RELEASE_TABLET | Freq: Two times a day (BID) | ORAL | 1 refills | Status: DC

## 2022-08-20 MED ORDER — RISPERIDONE 1 MG PO TABS: 1.0000 mg | ORAL_TABLET | Freq: Two times a day (BID) | ORAL | 1 refills | Status: DC

## 2022-08-20 MED ORDER — PRAZOSIN HCL 2 MG PO CAPS: 2.0000 mg | ORAL_CAPSULE | Freq: Every day | ORAL | 1 refills | Status: DC

## 2022-08-20 NOTE — Progress Notes (Signed)
[place] MD/PA/NP OP Progress Note  08/20/2022 10:36 AM Hunter Cook  MRN:  [id number]  Chief Complaint:  Chief Complaint  Patient presents with   Follow-up   Depression   Anxiety   HPI:  Hunter Cook is a 26 yr old male who presents for follow up and medication management.  PPHx is significant for Bipolar 1 Disorder, GAD, and PTSD and has had a hospitalization in the past.   He reports that he is not doing too well today.  When asked how his medicines were doing for him he reports that he had not been taking them consistently.  He reports also he will be going to jail for 4 months next Monday 10/2.  He reports he medication prior to prison because he feels like more charges will be added given his anger issues.  He asked if he could be started on Depakote as he did do well on it in the past.  He reports that he was up to 2000 mg a day of it.  Discussed that while he could restart it now he would have to be slowly worked back to that dosage and that he would need blood work.  Discussed with him that the jail system will have psychiatric care and so they would be able to get a Depakote level and further adjust his medication as needed.  Also discussed restarting the Risperdal all.  He reports his sleep has been very poor due to nightmares so discussed increasing his prazosin further.  He was agreeable to all of these medication changes.  He again reports that if his psychiatric issues do not improve he does not think he will get out of jail in 4 months because most likely more charges will be added to him.  He reports no SI, HI, or AVH.  He reports his appetite is doing good.  He will return for follow-up once he gets out of jail which at this time is approximately in February.  He will be receiving psychiatric care at the jail system until then.    Visit Diagnosis:    ICD-10-CM   1. Bipolar affective disorder, currently depressed, moderate (HCC)  F31.32 risperiDONE (RISPERDAL) 1 MG tablet     divalproex (DEPAKOTE) 500 MG DR tablet    2. Posttraumatic stress disorder  F43.10 prazosin (MINIPRESS) 2 MG capsule    3. Aggressive behavior  R46.89 divalproex (DEPAKOTE) 500 MG DR tablet    4. Generalized anxiety disorder  F41.1     5. Sleep disturbances  G47.9       Past Psychiatric History: Bipolar 1 Disorder, GAD, and PTSD and has had a hospitalization in the past.  Past Medical History:  Past Medical History:  Diagnosis Date   Bipolar 1 disorder ([place])    Bipolar disorder ([place])    Snake bite     Past Surgical History:  Procedure Laterality Date   FOOT SURGERY Right 2015   crush injury - 2 screws and 1 plate surgically placed   FRACTURE SURGERY     HARDWARE REMOVAL Left 01/14/2017   Procedure: Left Ankle Syndesmotic Screw Removal;  Surgeon: Marybelle Killings, MD;  Location: [place];  Service: Orthopedics;  Laterality: Left;   INCISION AND DRAINAGE Right 2012   snake bite to hand; had ~ 6 ORs related to this   Industry  2012   Multiple procedures due to snake bite   LAPAROTOMY N/A 12/20/2018   Procedure: EXPLORATORY LAPAROTOMY; SPLEENECTOMY;  Surgeon:  Axel Filler, MD;  [facility];  Service: General;  Laterality: N/A;   ORIF ANKLE FRACTURE Left 10/22/2016   Procedure: OPEN REDUCTION INTERNAL FIXATION (ORIF) BIMALLEOLAR ANKLE FRACTURE;  Surgeon: Eldred Manges, MD;  Location: [facility] OR;  Service: Orthopedics;  Laterality: Left;   ORIF ANKLE FRACTURE BIMALLEOLAR Left 10/22/2016   ORIF ANKLE FRACTURE BIMALLEOLAR Left 2017   ORIF ULNAR FRACTURE Left 12/22/2018   Procedure: OPEN REDUCTION INTERNAL FIXATION ULNAR FRACTURE;  Surgeon: Roby Lofts, MD;  Location: [facility] OR;  Service: Orthopedics;  Laterality: Left;   SKIN GRAFT  2012   "related to snake bite"   SKIN GRAFT  2013   when bitten by copperhead snake   TRACHEOSTOMY TUBE PLACEMENT N/A 01/13/2019   Procedure: TRACHEOSTOMY;  Surgeon: Violeta Gelinas, MD;  Location: [facility] OR;  Service: General;  Laterality: N/A;     Family Psychiatric History: Father- Completed Suicide in front of patient.  Family History:  Family History  Problem Relation Age of Onset   Heart attack Father    CAD Father    High blood pressure Father    High Cholesterol Father    High blood pressure Mother    High Cholesterol Mother     Social History:  Social History   Socioeconomic History   Marital status: Married    Spouse name: Not on file   Number of children: Not on file   Years of education: Not on file   Highest education level: Not on file  Occupational History   Not on file  Tobacco Use   Smoking status: Every Day    Packs/day: 0.50    Years: 8.00    Total pack years: 4.00    Types: Cigarettes   Smokeless tobacco: Never   Tobacco comments:    quit using chewing tobacco  Vaping Use   Vaping Use: Former  Substance and Sexual Activity   Alcohol use: Yes    Comment: (father unclear of true amt - "a lot" socially 3-4 times a week   Drug use: Yes    Types: "Crack" cocaine, Marijuana    Comment: rarely   Sexual activity: Yes  Other Topics Concern   Not on file  Social History Narrative   ** Merged History Encounter **       Social Determinants of Health   Financial Resource Strain: Unknown (02/11/2019)   Overall Financial Resource Strain (CARDIA)    Difficulty of Paying Living Expenses: Patient refused  Food Insecurity: Unknown (02/11/2019)   Hunger Vital Sign    Worried About Running Out of Food in the Last Year: Patient refused    Ran Out of Food in the Last Year: Patient refused  Transportation Needs: Unknown (02/11/2019)   PRAPARE - Transportation    Lack of Transportation (Medical): Patient refused    Lack of Transportation (Non-Medical): Patient refused  Physical Activity: Unknown (02/11/2019)   Exercise Vital Sign    Days of Exercise per Week: Patient refused    Minutes of Exercise per Session: Patient refused  Stress: Not on file  Social Connections: Unknown (02/11/2019)   Social  Connection and Isolation Panel [NHANES]    Frequency of Communication with Friends and Family: Patient refused    Frequency of Social Gatherings with Friends and Family: Patient refused    Attends Religious Services: Patient refused    Active Member of Clubs or Organizations: Patient refused    Attends Banker Meetings: Patient refused    Marital Status: Patient refused  Allergies:  Allergies  Allergen Reactions   Lamictal [Lamotrigine] Hives    Metabolic Disorder Labs: No results found for: "HGBA1C", "MPG" No results found for: "PROLACTIN" Lab Results  Component Value Date   TRIG 328 (H) 12/20/2018   Lab Results  Component Value Date   TSH 1.690 04/17/2021    Therapeutic Level Labs: No results found for: "LITHIUM" Lab Results  Component Value Date   VALPROATE 70 04/17/2021   No results found for: "CBMZ"  Current Medications: Current Outpatient Medications  Medication Sig Dispense Refill   divalproex (DEPAKOTE) 500 MG DR tablet Take 1 tablet (500 mg total) by mouth 2 (two) times daily. 60 tablet 1   acetaminophen (TYLENOL) 325 MG tablet Take 2 tablets (650 mg total) by mouth every 6 (six) hours as needed for mild pain or fever (Fever >101.5).     benzonatate (TESSALON) 200 MG capsule Take 1 capsule (200 mg total) by mouth 2 (two) times daily as needed for cough. 20 capsule 0   clonazePAM (KLONOPIN) 0.5 MG tablet TAKE 1 TABLET (0.5 MG TOTAL) BY MOUTH 3 (THREE) TIMES DAILY AS NEEDED FOR ANXIETY. 90 tablet 0   prazosin (MINIPRESS) 2 MG capsule Take 1 capsule (2 mg total) by mouth at bedtime. 30 capsule 1   risperiDONE (RISPERDAL) 1 MG tablet Take 1 tablet (1 mg total) by mouth 2 (two) times daily. Take one half tablet (0.5 mg) twice a day for 7 days then take one tablet (1 mg) twice a day 60 tablet 1   No current facility-administered medications for this visit.     Musculoskeletal: Strength & Muscle Tone: within normal limits Gait & Station:  normal Patient leans: N/A  Psychiatric Specialty Exam: Review of Systems  Respiratory:  Negative for cough and shortness of breath.   Cardiovascular:  Negative for chest pain.  Gastrointestinal:  Negative for abdominal pain, constipation, diarrhea, nausea and vomiting.  Neurological:  Negative for weakness and headaches.  Psychiatric/Behavioral:  Positive for agitation, dysphoric mood and sleep disturbance. Negative for hallucinations, self-injury and suicidal ideas. The patient is nervous/anxious.     There were no vitals taken for this visit.There is no height or weight on file to calculate BMI.  General Appearance: Casual and Fairly Groomed  Eye Contact:  Fair  Speech:  Clear and Coherent and Normal Rate  Volume:  Normal  Mood:  Anxious and Depressed  Affect:  Congruent and Constricted  Thought Process:  Coherent and Goal Directed  Orientation:  Full (Time, Place, and Person)  Thought Content: WDL and Logical   Suicidal Thoughts:  No  Homicidal Thoughts:  No  Memory:  Immediate;   Fair  Judgement:  Fair  Insight:  Fair  Psychomotor Activity:  Normal  Concentration:  Concentration: Good and Attention Span: Good  Recall:  [employer] of Knowledge: Fair  Language: Good  Akathisia:  Negative  Handed:  Right  AIMS (if indicated): done AIMS=0  Assets:  Communication Skills Desire for Improvement Resilience Social Support  ADL's:  Intact  Cognition: WNL  Sleep:  Poor   Screenings: AUDIT    Advertising copywriter from 02/27/2022 in [facility]  Alcohol Use Disorder Identification Test Final Score (AUDIT) 16      GAD-7    Flowsheet Row Video Visit from 04/04/2022 in [facility] Counselor from 02/27/2022 in [facility] Video Visit from 02/21/2022 in [facility] Counselor from 01/18/2022 in [facility] Office Visit from 01/08/2022 in [facility] at [facility]  Total GAD-7 Score 16 19 19 19 6       PHQ2-9    Flowsheet Row Video Visit from 04/04/2022 in [facility] Counselor from 02/27/2022 in [facility] Video Visit from 02/21/2022 in [facility] Counselor from 01/18/2022 in [facility] Office Visit from 01/08/2022 in [facility] [place] [facility] at [facility] Total Score 3 4 6 5 2   PHQ-9 Total Score 10 8 -- 19 8      Flowsheet Row Video Visit from 04/04/2022 in [facility] Most recent reading at 04/04/2022  3:13 PM ED from 02/23/2022 in [facility] [place] [facility] Most recent reading at 02/23/2022  3:12 PM ED from 02/23/2022 in [facility] EMERGENCY DEPARTMENT Most recent reading at 02/23/2022 11:59 AM  C-SSRS RISK CATEGORY No Risk No Risk No Risk        Assessment and Plan: Diondre L. Frisbee is a 26 yr old male who presents for follow up and medication management.  PPHx is significant for Bipolar 1 Disorder, GAD, and PTSD and has had a hospitalization in the past.     [place] was not consistent with taking his medication and so it is unknown his response to it.  Since he did respond to Depakote in the past we will restart this.  We will restart at 500 mg twice a day since he was at approximately 2000 mg a day when he was last taking it.  We will restart the Risperdal.  We will further increase his prazosin for his sleep.  He will return for follow-up in approximately 4 months after he serves his jail sentence.  He will discuss with prison psychiatric team about getting a Depakote level and making further medication changes as needed.     Bipolar affective disorder, currently depressed, moderate:  -Restart Risperdal 0.5 mg BID for 3 days then increase to 1 mg BID for mood stability and anger.  60 tablets with 1 refills    Generalized anxiety  disorder:   -Restart Risperdal 0.5 mg BID for 3 days then increase to 1 mg BID for mood stability and anger.  60 tablets with 1 refills -Continue ClonazePAM (KLONOPIN) 0.5 MG tablet; Take 1 tablet (0.5 mg total) by mouth 3 (three) times daily as needed for anxiety.  No refill sent    Posttraumatic stress disorder:  -Increase Prazosin to 2 mg QHS.  30 capsules with 1 refill.    Collaboration of Care:   Patient/Guardian was advised Release of Information must be obtained prior to any record release in order to collaborate their care with an outside provider. Patient/Guardian was advised if they have not already done so to contact the registration department to sign all necessary forms in order for 26 to release information regarding their care.   Consent: Patient/Guardian gives verbal consent for treatment and assignment of benefits for services provided during this visit. Patient/Guardian expressed understanding and agreed to proceed.    Selena Batten, MD 08/20/2022, 10:36 AM

## 2022-08-24 ENCOUNTER — Other Ambulatory Visit (HOSPITAL_COMMUNITY): Payer: Self-pay | Admitting: Student in an Organized Health Care Education/Training Program

## 2022-08-24 DIAGNOSIS — F411 Generalized anxiety disorder: Secondary | ICD-10-CM

## 2022-08-24 MED ORDER — HYDROXYZINE HCL 25 MG PO TABS: 25.0000 mg | ORAL_TABLET | Freq: Three times a day (TID) | ORAL | 0 refills | Status: DC | PRN

## 2022-08-24 NOTE — Progress Notes (Signed)
Received message requesting Hydroxyzine as he will not be able to continue his Klonopin while in jail the next 4 months.  Hydroxyzine was sent.    Sent- Hydroxyzine 25 mg TID PRN for anxiety.  45 tablets with 0 refills sent.    Fatima Sanger MD Resident

## 2022-12-27 ENCOUNTER — Other Ambulatory Visit (HOSPITAL_COMMUNITY): Payer: Self-pay | Admitting: Physician Assistant

## 2022-12-27 DIAGNOSIS — F411 Generalized anxiety disorder: Secondary | ICD-10-CM

## 2023-01-02 ENCOUNTER — Telehealth (HOSPITAL_COMMUNITY): Payer: Self-pay | Admitting: *Deleted

## 2023-01-02 NOTE — Telephone Encounter (Signed)
Received request for refill of patient's Klonopin.  Per PDMP last fill was 10/16.  He had been in prison for the past few months and last communication received stated that he was requesting a prescription for hydroxyzine as he would not be able to get Klonopin while in prison.  It does not appear that he has been on the Klonopin since then so we will not send in a refill at this time.  We will discuss with him at follow-up appointment on 01/16/2023.   Fatima Sanger MD Resident

## 2023-01-02 NOTE — Telephone Encounter (Signed)
Pt called requesting refill of Klonopin 0.5 mg TID PRN. Last script sent electronically on 08/15/22 to [facility]. Script was sent in by PA @ [facility]. Pt has an upcoming appointment with you on 01/16/23. Last visit was 08/20/22. Please review.

## 2023-01-09 ENCOUNTER — Encounter (HOSPITAL_COMMUNITY): Payer: Self-pay | Admitting: Student in an Organized Health Care Education/Training Program

## 2023-01-09 ENCOUNTER — Telehealth (HOSPITAL_BASED_OUTPATIENT_CLINIC_OR_DEPARTMENT_OTHER): Payer: Medicare Other | Admitting: Student in an Organized Health Care Education/Training Program

## 2023-01-09 DIAGNOSIS — R4689 Other symptoms and signs involving appearance and behavior: Secondary | ICD-10-CM

## 2023-01-09 DIAGNOSIS — G479 Sleep disorder, unspecified: Secondary | ICD-10-CM

## 2023-01-09 DIAGNOSIS — F3132 Bipolar disorder, current episode depressed, moderate: Secondary | ICD-10-CM

## 2023-01-09 DIAGNOSIS — F4310 Post-traumatic stress disorder, unspecified: Secondary | ICD-10-CM

## 2023-01-09 DIAGNOSIS — F411 Generalized anxiety disorder: Secondary | ICD-10-CM

## 2023-01-09 MED ORDER — HYDROXYZINE HCL 25 MG PO TABS: 25.0000 mg | ORAL_TABLET | Freq: Three times a day (TID) | ORAL | 0 refills | Status: DC | PRN

## 2023-01-09 MED ORDER — PROPRANOLOL HCL 10 MG PO TABS: 10.0000 mg | ORAL_TABLET | Freq: Two times a day (BID) | ORAL | 1 refills | Status: DC

## 2023-01-09 MED ORDER — DIVALPROEX SODIUM 500 MG PO DR TAB: 500.0000 mg | DELAYED_RELEASE_TABLET | Freq: Two times a day (BID) | ORAL | 1 refills | Status: DC

## 2023-01-09 MED ORDER — PRAZOSIN HCL 2 MG PO CAPS: 2.0000 mg | ORAL_CAPSULE | Freq: Every day | ORAL | 1 refills | Status: DC

## 2023-01-09 MED ORDER — RISPERIDONE 1 MG PO TABS: 1.0000 mg | ORAL_TABLET | Freq: Two times a day (BID) | ORAL | 1 refills | Status: DC
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
## 2023-01-09 NOTE — Addendum Note (Signed)
Addended by: Charlette Caffey on: 01/09/2023 04:43 PM   Modules accepted: Level of Service

## 2023-01-15 ENCOUNTER — Ambulatory Visit (HOSPITAL_COMMUNITY): Payer: Medicare Other | Admitting: Student in an Organized Health Care Education/Training Program

## 2023-01-16 ENCOUNTER — Ambulatory Visit (HOSPITAL_COMMUNITY): Payer: Medicare Other | Admitting: Student in an Organized Health Care Education/Training Program

## 2023-01-29 ENCOUNTER — Ambulatory Visit: Payer: Medicare Other | Admitting: Nurse Practitioner

## 2023-01-30 ENCOUNTER — Other Ambulatory Visit (HOSPITAL_COMMUNITY): Payer: Self-pay | Admitting: Physician Assistant

## 2023-01-30 DIAGNOSIS — F411 Generalized anxiety disorder: Secondary | ICD-10-CM

## 2023-02-13 ENCOUNTER — Telehealth (HOSPITAL_COMMUNITY): Payer: Medicare Other | Admitting: Student in an Organized Health Care Education/Training Program

## 2023-02-14 ENCOUNTER — Telehealth (HOSPITAL_COMMUNITY): Payer: Self-pay | Admitting: *Deleted

## 2023-02-14 DIAGNOSIS — F4310 Post-traumatic stress disorder, unspecified: Secondary | ICD-10-CM

## 2023-02-14 DIAGNOSIS — F411 Generalized anxiety disorder: Secondary | ICD-10-CM

## 2023-02-14 DIAGNOSIS — F3132 Bipolar disorder, current episode depressed, moderate: Secondary | ICD-10-CM

## 2023-02-14 DIAGNOSIS — R4689 Other symptoms and signs involving appearance and behavior: Secondary | ICD-10-CM

## 2023-02-14 NOTE — Telephone Encounter (Signed)
Pt's wife, Lorriane Shire, LVM stating that she would like to speak to you regarding pt. Bodyn was on scheduled for a visit yesterday but cx. She may be reached @ [phone].

## 2023-02-15 MED ORDER — HYDROXYZINE HCL 25 MG PO TABS: 25.0000 mg | ORAL_TABLET | Freq: Three times a day (TID) | ORAL | 0 refills | Status: DC | PRN

## 2023-02-15 MED ORDER — RISPERIDONE 1 MG PO TABS: 1.0000 mg | ORAL_TABLET | Freq: Two times a day (BID) | ORAL | 1 refills | Status: DC

## 2023-02-15 MED ORDER — DIVALPROEX SODIUM 500 MG PO DR TAB: 500.0000 mg | DELAYED_RELEASE_TABLET | Freq: Two times a day (BID) | ORAL | 1 refills | Status: DC

## 2023-02-15 MED ORDER — PRAZOSIN HCL 2 MG PO CAPS: 2.0000 mg | ORAL_CAPSULE | Freq: Every day | ORAL | 1 refills | Status: DC

## 2023-02-15 MED ORDER — PROPRANOLOL HCL 20 MG PO TABS: 20.0000 mg | ORAL_TABLET | Freq: Two times a day (BID) | ORAL | 1 refills | Status: DC

## 2023-02-15 NOTE — Telephone Encounter (Signed)
Called patient's wife Lorriane Shire back, [phone].  She reports that he was at work Wednesday and they became so busy that he lost track of time and this was why he missed the appointment.  She reports that the patient has had a good response to the propranolol and that it has been very helpful with his anxiety.  She reports that he feels like an increase in the medication would be helpful and so discussed that we could do this at this time.  Discussed refills of his other medications would be sent to him and she could call the office to reschedule his follow-up appointment.  She reported no other concerns at present.    Sent: -Propanolol 20 mg BID.  60 tablets with 1 refill. -Risperdal 1 mg BID for mood stability and anger.  60 tablets with 1 refill.  -Depakote to 500 mg BID for mood stability.  60 tablets with 1 refill. -Hydroxyzine 25 mg TID PRN. 45 tablets with 0 refills.  -Prazosin 2 mg QHS.  30 capsules with 1 refill.    Fatima Sanger MD Resident

## 2023-02-24 NOTE — Progress Notes (Unsigned)
Established patient visit   Patient: Hunter Cook   DOB: [birth year]   27 y.o. Male  MRN: [id number] Visit Date: 02/25/2023   No chief complaint on file.  Subjective    HPI  Follow up  -fatigue, possibly related to sleep apnea  -patient has not been seen since 12/2021 -sees psychiatry    Medications: Outpatient Medications Prior to Visit  Medication Sig   acetaminophen (TYLENOL) 325 MG tablet Take 2 tablets (650 mg total) by mouth every 6 (six) hours as needed for mild pain or fever (Fever >101.5).   benzonatate (TESSALON) 200 MG capsule Take 1 capsule (200 mg total) by mouth 2 (two) times daily as needed for cough.   clonazePAM (KLONOPIN) 0.5 MG tablet TAKE 1 TABLET (0.5 MG TOTAL) BY MOUTH 3 (THREE) TIMES DAILY AS NEEDED FOR ANXIETY.   divalproex (DEPAKOTE) 500 MG DR tablet Take 1 tablet (500 mg total) by mouth 2 (two) times daily.   hydrOXYzine (ATARAX) 25 MG tablet Take 1 tablet (25 mg total) by mouth 3 (three) times daily as needed.   prazosin (MINIPRESS) 2 MG capsule Take 1 capsule (2 mg total) by mouth at bedtime.   propranolol (INDERAL) 20 MG tablet Take 1 tablet (20 mg total) by mouth 2 (two) times daily.   risperiDONE (RISPERDAL) 1 MG tablet Take 1 tablet (1 mg total) by mouth 2 (two) times daily.   No facility-administered medications prior to visit.    Review of Systems  {Labs (Optional):23779}   Objective    There were no vitals filed for this visit. There is no height or weight on file to calculate BMI.  BP Readings from Last 3 Encounters:  02/23/22 130/84  02/23/22 (Abnormal) 152/115  02/23/22 (Abnormal) 131/98    Wt Readings from Last 3 Encounters:  02/23/22 300 lb (136.1 kg)  01/08/22 300 lb (136.1 kg)  10/09/21 283 lb 4.8 oz (128.5 kg)    Physical Exam  ***  No results found for any visits on 02/25/23.  Assessment & Plan    There are no diagnoses linked to this encounter.   No follow-ups on file.         Ronnell Freshwater, NP  [facility]  [facility] at [facility] [phone] (phone) [phone] (fax)  [place]

## 2023-02-25 ENCOUNTER — Encounter: Payer: Self-pay | Admitting: Nurse Practitioner

## 2023-02-25 ENCOUNTER — Ambulatory Visit (INDEPENDENT_AMBULATORY_CARE_PROVIDER_SITE_OTHER): Payer: Medicare Other | Admitting: Nurse Practitioner

## 2023-02-25 VITALS — BP 120/77 | HR 77 | Ht 67.0 in | Wt 290.1 lb

## 2023-02-25 DIAGNOSIS — R29818 Other symptoms and signs involving the nervous system: Secondary | ICD-10-CM

## 2023-02-25 DIAGNOSIS — R0683 Snoring: Secondary | ICD-10-CM

## 2023-02-25 DIAGNOSIS — Z6841 Body Mass Index (BMI) 40.0 and over, adult: Secondary | ICD-10-CM | POA: Diagnosis not present

## 2023-02-25 DIAGNOSIS — G4719 Other hypersomnia: Secondary | ICD-10-CM

## 2023-02-25 NOTE — Assessment & Plan Note (Signed)
Patient referred to [facility] for further evaluation of possible sleep apnea.

## 2023-02-25 NOTE — Assessment & Plan Note (Signed)
Patient referred to [facility] for further evaluation of possible sleep apnea.

## 2023-02-25 NOTE — Assessment & Plan Note (Signed)
Discussed lowering calorie intake to 1500 calories per day and incorporating exercise into daily routine to help lose weight.  °

## 2023-02-25 NOTE — Assessment & Plan Note (Signed)
Patient referred to [facility] for further evaluation of possible sleep apnea.

## 2023-03-07 ENCOUNTER — Telehealth (HOSPITAL_BASED_OUTPATIENT_CLINIC_OR_DEPARTMENT_OTHER): Payer: Medicare Other | Admitting: Student in an Organized Health Care Education/Training Program

## 2023-03-07 ENCOUNTER — Encounter (HOSPITAL_COMMUNITY): Payer: Self-pay | Admitting: Student in an Organized Health Care Education/Training Program

## 2023-03-07 DIAGNOSIS — R4689 Other symptoms and signs involving appearance and behavior: Secondary | ICD-10-CM

## 2023-03-07 DIAGNOSIS — Z79899 Other long term (current) drug therapy: Secondary | ICD-10-CM

## 2023-03-07 DIAGNOSIS — F411 Generalized anxiety disorder: Secondary | ICD-10-CM

## 2023-03-07 DIAGNOSIS — F4310 Post-traumatic stress disorder, unspecified: Secondary | ICD-10-CM

## 2023-03-07 DIAGNOSIS — F3132 Bipolar disorder, current episode depressed, moderate: Secondary | ICD-10-CM

## 2023-03-07 MED ORDER — DIVALPROEX SODIUM 500 MG PO DR TAB: 500.0000 mg | DELAYED_RELEASE_TABLET | Freq: Three times a day (TID) | ORAL | 1 refills | Status: DC

## 2023-03-07 MED ORDER — PRAZOSIN HCL 2 MG PO CAPS: 2.0000 mg | ORAL_CAPSULE | Freq: Every day | ORAL | 1 refills | Status: DC

## 2023-03-07 MED ORDER — RISPERIDONE 1 MG PO TABS: 1.0000 mg | ORAL_TABLET | Freq: Two times a day (BID) | ORAL | 1 refills | Status: DC

## 2023-03-07 MED ORDER — PROPRANOLOL HCL 20 MG PO TABS: 20.0000 mg | ORAL_TABLET | Freq: Two times a day (BID) | ORAL | 1 refills | Status: DC
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
## 2023-03-07 NOTE — Addendum Note (Signed)
Addended by: Everlena Cooper on: 03/07/2023 11:35 AM   Modules accepted: Level of Service

## 2023-03-16 ENCOUNTER — Encounter: Payer: Self-pay | Admitting: Nurse Practitioner

## 2023-03-25 ENCOUNTER — Ambulatory Visit (INDEPENDENT_AMBULATORY_CARE_PROVIDER_SITE_OTHER): Payer: Medicare Other | Admitting: Neurology

## 2023-03-25 ENCOUNTER — Encounter: Payer: Self-pay | Admitting: Neurology

## 2023-03-25 VITALS — BP 128/80 | HR 76 | Ht 67.0 in | Wt 299.2 lb

## 2023-03-25 DIAGNOSIS — R635 Abnormal weight gain: Secondary | ICD-10-CM | POA: Diagnosis not present

## 2023-03-25 DIAGNOSIS — G4719 Other hypersomnia: Secondary | ICD-10-CM

## 2023-03-25 DIAGNOSIS — R0683 Snoring: Secondary | ICD-10-CM | POA: Diagnosis not present

## 2023-03-25 DIAGNOSIS — Z9189 Other specified personal risk factors, not elsewhere classified: Secondary | ICD-10-CM

## 2023-03-25 DIAGNOSIS — R0681 Apnea, not elsewhere classified: Secondary | ICD-10-CM

## 2023-03-25 DIAGNOSIS — Z820 Family history of epilepsy and other diseases of the nervous system: Secondary | ICD-10-CM

## 2023-03-25 DIAGNOSIS — Z6841 Body Mass Index (BMI) 40.0 and over, adult: Secondary | ICD-10-CM

## 2023-03-25 NOTE — Progress Notes (Signed)
Subjective:    Patient ID: Hunter Cook is a 27 y.o. male.  HPI    Hunter Foley, MD, PhD [facility] [address] [address] [place], [place] [address]  Dear Hunter Cook,  I saw your patient, Hunter Cook, upon your kind request in my sleep clinic today for initial consultation of his sleep disorder, in particular, concern for underlying obstructive sleep apnea.  The patient is unaccompanied today.  As you know, Hunter Cook is a 27 year old male with an underlying medical history of HTN, Hx of traumatic injuries with Hx of trach, mood disorder, smoking, Hx of DVT in 2020, and severe obesity with a BMI of over 45, who reports snoring and excessive daytime somnolence as well as witnessed apneas.  His Epworth sleepiness score is 16 out of 24, fatigue severity score is 52 out of 63.  I reviewed your office note from 02/25/2023. Of note, he is on multiple potentially sedating medications including Depakote, Inderal, Risperdal, and prazosin.  The patient requested that I speak with his wife, Hunter Cook also.  She was on speaker phone at the beginning of our visit and reports that he has had worsening snoring and worsening gasping sounds as well as longer.'s of not breathing while asleep to the point where she has to shake him.  He reports occasional sensation of gasping at night.  He has gained about 100 pounds in the past 3 to 4 years.  He works in [employer].  He smokes cigarettes, 1 pack/day and is not currently trying to quit.  He does not currently drink any alcohol, he smokes marijuana occasionally, less than once a week.  He is not currently actively working on weight loss.  He lives with his wife and young son who is nearly 43 years old.  They have 4 cats in the household and the cats typically sleep in their bedroom, often on the bed.  He has a TV on in his bedroom at night and it turns off on a timer.  Bedtime is generally around 10 PM and rise time between 6 and 7 AM.   He denies nightly nocturia and denies recurrent nocturnal or morning headaches.  He reports that a paternal aunt has sleep apnea.  He drinks caffeine in the form of coffee and energy drink, about 3 servings altogether per day.  He sleeps on his right side or stomach, he cannot sleep on his left side because of his shoulder.  He does not feel that he can sleep on his back because his snoring is so loud.  His Past Medical History Is Significant For: Past Medical History:  Diagnosis Date   Bipolar 1 disorder (HCC)    Bipolar disorder (HCC)    Snake bite     His Past Surgical History Is Significant For: Past Surgical History:  Procedure Laterality Date   FOOT SURGERY Right 2015   crush injury - 2 screws and 1 plate surgically placed   FRACTURE SURGERY     HARDWARE REMOVAL Left 01/14/2017   Procedure: Left Ankle Syndesmotic Screw Removal;  Surgeon: Eldred Manges, MD;  Location: [facility] OR;  Service: Orthopedics;  Laterality: Left;   INCISION AND DRAINAGE Right 2012   snake bite to hand; had ~ 6 ORs related to this   INCISION AND DRAINAGE  2012   Multiple procedures due to snake bite   LAPAROTOMY N/A 12/20/2018   Procedure: EXPLORATORY LAPAROTOMY; SPLEENECTOMY;  Surgeon: Axel Filler, MD;  Location: [facility] OR;  Service: General;  Laterality: N/A;   ORIF ANKLE FRACTURE Left 10/22/2016   Procedure: OPEN REDUCTION INTERNAL FIXATION (ORIF) BIMALLEOLAR ANKLE FRACTURE;  Surgeon: Eldred Manges, MD;  Location: [facility] OR;  Service: Orthopedics;  Laterality: Left;   ORIF ANKLE FRACTURE BIMALLEOLAR Left 10/22/2016   ORIF ANKLE FRACTURE BIMALLEOLAR Left 2017   ORIF ULNAR FRACTURE Left 12/22/2018   Procedure: OPEN REDUCTION INTERNAL FIXATION ULNAR FRACTURE;  Surgeon: Roby Lofts, MD;  Location: [facility] OR;  Service: Orthopedics;  Laterality: Left;   SKIN GRAFT  2012   "related to snake bite"   SKIN GRAFT  2013   when bitten by copperhead snake   TRACHEOSTOMY TUBE PLACEMENT N/A 01/13/2019   Procedure: TRACHEOSTOMY;   Surgeon: Violeta Gelinas, MD;  Location: [facility];  Service: General;  Laterality: N/A;    His Family History Is Significant For: Family History  Problem Relation Age of Onset   High blood pressure Mother    High Cholesterol Mother    Heart attack Father    CAD Father    High blood pressure Father    High Cholesterol Father    Sleep apnea Paternal Aunt     His Social History Is Significant For: Social History   Socioeconomic History   Marital status: Married    Spouse name: Not on file   Number of children: Not on file   Years of education: Not on file   Highest education level: Not on file  Occupational History   Not on file  Tobacco Use   Smoking status: Every Day    Packs/day: 0.50    Years: 8.00    Additional pack years: 0.00    Total pack years: 4.00    Types: Cigarettes   Smokeless tobacco: Never   Tobacco comments:    quit using chewing tobacco  Vaping Use   Vaping Use: Former  Substance and Sexual Activity   Alcohol use: Yes    Comment: (father unclear of true amt - "a lot" socially 3-4 times a week   Drug use: Yes    Types: Marijuana    Comment: rarely   Sexual activity: Yes  Other Topics Concern   Not on file  Social History Narrative   ** Merged History Encounter **       Social Determinants of Health   Financial Resource Strain: Unknown (02/11/2019)   Overall Financial Resource Strain (CARDIA)    Difficulty of Paying Living Expenses: Patient declined  Food Insecurity: Unknown (02/11/2019)   Hunger Vital Sign    Worried About Running Out of Food in the Last Year: Patient declined    Ran Out of Food in the Last Year: Patient declined  Transportation Needs: Unknown (02/11/2019)   PRAPARE - Administrator, Civil Service (Medical): Patient declined    Lack of Transportation (Non-Medical): Patient declined  Physical Activity: Unknown (02/11/2019)   Exercise Vital Sign    Days of Exercise per Week: Patient declined    Minutes of Exercise per  Session: Patient declined  Stress: Not on file  Social Connections: Unknown (02/11/2019)   Social Connection and Isolation Panel [NHANES]    Frequency of Communication with Friends and Family: Patient declined    Frequency of Social Gatherings with Friends and Family: Patient declined    Attends Religious Services: Patient declined    Database administrator or Organizations: Patient declined    Attends Banker Meetings: Patient declined    Marital Status: Patient declined  His Allergies Are:  Allergies  Allergen Reactions   Lamictal [Lamotrigine] Hives  :   His Current Medications Are:  Outpatient Encounter Medications as of 03/25/2023  Medication Sig   divalproex (DEPAKOTE) 500 MG DR tablet Take 1 tablet (500 mg total) by mouth 3 (three) times daily. Take 2 tablets in the morning (1000 mg) and 1 tablet at night (500 mg)   prazosin (MINIPRESS) 2 MG capsule Take 1 capsule (2 mg total) by mouth at bedtime.   propranolol (INDERAL) 20 MG tablet Take 1 tablet (20 mg total) by mouth 2 (two) times daily.   risperiDONE (RISPERDAL) 1 MG tablet Take 1 tablet (1 mg total) by mouth 2 (two) times daily.   No facility-administered encounter medications on file as of 03/25/2023.  :   Review of Systems:  Out of a complete 14 point review of systems, all are reviewed and negative with the exception of these symptoms as listed below:  Review of Systems  Neurological:        Pt here for sleep consult Pt snores,fatigue, stop breathing during the night Pt states wakes up choking   Pt denies hypertension,headaches,sleep study,CPAP machine     ESS:16 FSS:52    Objective:  Neurological Exam  Physical Exam Physical Examination:   Vitals:   03/25/23 1502  BP: 128/80  Pulse: 76    General Examination: The patient is a very pleasant 27 y.o. male in no acute distress. He appears well-developed and well-nourished and well groomed.   HEENT: Normocephalic, atraumatic, pupils are  equal, round and reactive to light, extraocular tracking is good without limitation to gaze excursion or nystagmus noted. Hearing is grossly intact. Face is symmetric with normal facial animation. Speech is clear with no dysarthria noted. There is no hypophonia. There is no lip, neck/head, jaw or voice tremor. Neck is supple with full range of passive and active motion. There are no carotid bruits on auscultation. Oropharynx exam reveals: mild mouth dryness, adequate dental hygiene with missing left top front tooth, and moderate airway crowding secondary to small airway entry, tonsillar size of 3+ on the right and 2+ on the left, Mallampati class III.  Neck circumference 17-1/8 inches.  He has a mild overbite.  Tongue protrudes centrally and palate elevates symmetrically.  Unremarkable healed tracheostomy scar.   Chest: Clear to auscultation without wheezing, rhonchi or crackles noted.  Heart: S1+S2+0, regular and normal without murmurs, rubs or gallops noted.   Abdomen: Soft, non-tender and non-distended.  Extremities: There is no pitting edema in the distal lower extremities bilaterally.   Skin: Warm and dry without trophic changes noted.   Musculoskeletal: exam reveals no obvious joint deformities, decreased range of motion left shoulder, wider ankle noted left, status post surgery.   Neurologically:  Mental status: The patient is awake, alert and oriented in all 4 spheres. His immediate and remote memory, attention, language skills and fund of knowledge are appropriate. There is no evidence of aphasia, agnosia, apraxia or anomia. Speech is clear with normal prosody and enunciation. Thought process is linear. Mood is normal and affect is normal.  Cranial nerves II - XII are as described above under HEENT exam.  Motor exam: Normal bulk, strength and tone is noted. There is no obvious action or resting tremor.  Fine motor skills and coordination: grossly intact.  Cerebellar testing: No dysmetria  or intention tremor. There is no truncal or gait ataxia.  Sensory exam: intact to light touch in the upper and lower extremities.  Gait, station and balance: He stands easily. No veering to one side is noted. No leaning to one side is noted. Posture is age-appropriate and stance is narrow based. Gait shows normal stride length and normal pace. No problems turning are noted.   Assessment and Plan:  In summary, Sender Rueb is a very pleasant 27 y.o.-year old male with an underlying medical history of HTN, Hx of traumatic injuries with Hx of trach, mood disorder, smoking, Hx of DVT in 2020, and severe obesity with a BMI of over 45, whose history and physical exam are concerning for sleep disordered breathing, particularly obstructive sleep apnea (OSA).  While a laboratory attended sleep study is typically considered "gold standard" for evaluation of sleep disordered breathing, we mutually agreed to proceed with a home sleep test at this time.   I had a long chat with the patient about my findings and the diagnosis of sleep apnea, particularly OSA, its prognosis and treatment options. We talked about medical/conservative treatments, surgical interventions and non-pharmacological approaches for symptom control. I explained, in particular, the risks and ramifications of untreated moderate to severe OSA, especially with respect to developing cardiovascular disease down the road, including congestive heart failure (CHF), difficult to treat hypertension, cardiac arrhythmias (particularly A-fib), neurovascular complications including TIA, stroke and dementia. Even type 2 diabetes has, in part, been linked to untreated OSA. Symptoms of untreated OSA may include (but may not be limited to) daytime sleepiness, nocturia (i.e. frequent nighttime urination), memory problems, mood irritability and suboptimally controlled or worsening mood disorder such as depression and/or anxiety, lack of energy, lack of motivation,  physical discomfort, as well as recurrent headaches, especially morning or nocturnal headaches. We talked about the importance of maintaining a healthy lifestyle and striving for healthy weight.  The importance of comple smoking cessation was also addressed.  In addition, we talked about the importance of striving for and maintaining good sleep hygiene. I recommended a sleep study at this time. I outlined the differences between a laboratory attended sleep study which is considered more comprehensive and accurate over the option of a home sleep test (HST); the latter may lead to underestimation of sleep disordered breathing in some instances and does not help with diagnosing upper airway resistance syndrome and is not accurate enough to diagnose primary central sleep apnea typically. I outlined possible surgical and non-surgical treatment options of OSA, including the use of a positive airway pressure (PAP) device (i.e. CPAP, AutoPAP/APAP or BiPAP in certain circumstances), a custom-made dental device (aka oral appliance, which would require a referral to a specialist dentist or orthodontist typically, and is generally speaking not considered for patients with full dentures or edentulous state), upper airway surgical options, such as traditional UPPP (which is not considered a first-line treatment) or the Inspire device (hypoglossal nerve stimulator, which would involve a referral for consultation with an ENT surgeon, after careful selection, following inclusion criteria - also not first-line treatment). I explained the PAP treatment option to the patient in detail, as this is generally considered first-line treatment.  The patient indicated that he would be willing to try PAP therapy, if the need arises. I explained the importance of being compliant with PAP treatment, not only for insurance purposes but primarily to improve patient's symptoms symptoms, and for the patient's long term health benefit, including to  reduce His cardiovascular risks longer-term.    We will pick up our discussion about the next steps and treatment options after testing.  We will keep him  posted as to the test results by phone call and/or MyChart messaging where possible.  We will plan to follow-up in sleep clinic accordingly as well.  I answered all his questions today and the patient was in agreement.   I encouraged him to call with any interim questions, concerns, problems or updates or email [place] through MyChart.  Generally speaking, sleep test authorizations may take up to 2 weeks, sometimes less, sometimes longer, the patient is encouraged to get in touch with [place] if they do not hear back from the sleep lab staff directly within the next 2 weeks.  Thank you very much for allowing me to participate in the care of this nice patient. If I can be of any further assistance to you please do not hesitate to call me at [phone].  Sincerely,   Hunter Foley, MD, PhD

## 2023-03-25 NOTE — Patient Instructions (Signed)

## 2023-03-26 ENCOUNTER — Ambulatory Visit: Payer: Medicare Other | Admitting: Nurse Practitioner

## 2023-04-04 ENCOUNTER — Telehealth: Payer: Self-pay | Admitting: Neurology

## 2023-04-04 NOTE — Telephone Encounter (Signed)
HST- Medicare/medicaid no auth req   Patient is scheduled at [facility] for 04/24/23 at 3:30 pm.  Mailed packet to the patient.

## 2023-04-09 ENCOUNTER — Ambulatory Visit (INDEPENDENT_AMBULATORY_CARE_PROVIDER_SITE_OTHER): Payer: Medicare Other

## 2023-04-09 VITALS — Ht 67.0 in | Wt 300.0 lb

## 2023-04-09 DIAGNOSIS — Z0000 Encounter for general adult medical examination without abnormal findings: Secondary | ICD-10-CM

## 2023-04-09 DIAGNOSIS — Z1159 Encounter for screening for other viral diseases: Secondary | ICD-10-CM

## 2023-04-09 NOTE — Addendum Note (Signed)
Addended by: Saralyn Pilar on: 04/09/2023 04:14 PM   Modules accepted: Orders

## 2023-04-09 NOTE — Patient Instructions (Addendum)
Hunter Cook , Thank you for taking time to come for your Medicare Wellness Visit. I appreciate your ongoing commitment to your health goals. Please review the following plan we discussed and let me know if I can assist you in the future.   These are the goals we discussed:  Goals       Patient stated (pt-stated)      Make more money.        This is a list of the screening recommended for you and due dates:  Health Maintenance  Topic Date Due   COVID-19 Vaccine (1) 04/25/2023*   Hepatitis C Screening: USPSTF Recommendation to screen - Ages 18-79 yo.  04/08/2024*   Flu Shot  06/27/2023   Medicare Annual Wellness Visit  04/08/2024   DTaP/Tdap/Td vaccine (2 - Td or Tdap) 12/19/2028   HIV Screening  Completed   HPV Vaccine  Aged Out  *Topic was postponed. The date shown is not the original due date.    Advanced directives: Advance directive discussed with you today. Even though you declined this today, please call our office should you change your mind, and we can give you the proper paperwork for you to fill out.   Conditions/risks identified: None  Next appointment: Follow up in one year for your annual wellness visit    Preventive Care 44-51 Years Old, Male Preventive care refers to lifestyle choices and visits with your health care provider that can promote health and wellness. Preventive care visits are also called wellness exams. What can I expect for my preventive care visit? Counseling During your preventive care visit, your health care provider may ask about your: Medical history, including: Past medical problems. Family medical history. Current health, including: Emotional well-being. Home life and relationship well-being. Sexual activity. Lifestyle, including: Alcohol, nicotine or tobacco, and drug use. Access to firearms. Diet, exercise, and sleep habits. Safety issues such as seatbelt and bike helmet use. Sunscreen use. Work and work Astronomer. Physical  exam Your health care provider may check your: Height and weight. These may be used to calculate your BMI (body mass index). BMI is a measurement that tells if you are at a healthy weight. Waist circumference. This measures the distance around your waistline. This measurement also tells if you are at a healthy weight and may help predict your risk of certain diseases, such as type 2 diabetes and high blood pressure. Heart rate and blood pressure. Body temperature. Skin for abnormal spots. What immunizations do I need? Vaccines are usually given at various ages, according to a schedule. Your health care provider will recommend vaccines for you based on your age, medical history, and lifestyle or other factors, such as travel or where you work. What tests do I need? Screening Your health care provider may recommend screening tests for certain conditions. This may include: Lipid and cholesterol levels. Diabetes screening. This is done by checking your blood sugar (glucose) after you have not eaten for a while (fasting). Hepatitis B test. Hepatitis C test. HIV (human immunodeficiency virus) test. STI (sexually transmitted infection) testing, if you are at risk. Talk with your health care provider about your test results, treatment options, and if necessary, the need for more tests. Follow these instructions at home: Eating and drinking  Eat a healthy diet that includes fresh fruits and vegetables, whole grains, lean protein, and low-fat dairy products. Drink enough fluid to keep your urine pale yellow. Take vitamin and mineral supplements as recommended by your health care provider. Do  not drink alcohol if your health care provider tells you not to drink. If you drink alcohol: Limit how much you have to 0-2 drinks a day. Know how much alcohol is in your drink. In the [place] one drink equals one 12 oz bottle of beer (355 mL), one 5 oz glass of wine (148 mL), or one 1 oz glass of hard liquor  (44 mL). Lifestyle Brush your teeth every morning and night with fluoride toothpaste. Floss one time each day. Exercise for at least 30 minutes 5 or more days each week. Do not use any products that contain nicotine or tobacco. These products include cigarettes, chewing tobacco, and vaping devices, such as e-cigarettes. If you need help quitting, ask your health care provider. Do not use drugs. If you are sexually active, practice safe sex. Use a condom or other form of protection to prevent STIs. Find healthy ways to manage stress, such as: Meditation, yoga, or listening to music. Journaling. Talking to a trusted person. Spending time with friends and family. Minimize exposure to UV radiation to reduce your risk of skin cancer. Safety Always wear your seat belt while driving or riding in a vehicle. Do not drive: If you have been drinking alcohol. Do not ride with someone who has been drinking. If you have been using any mind-altering substances or drugs. While texting. When you are tired or distracted. Wear a helmet and other protective equipment during sports activities. If you have firearms in your house, make sure you follow all gun safety procedures. Seek help if you have been physically or sexually abused. What's next? Go to your health care provider once a year for an annual wellness visit. Ask your health care provider how often you should have your eyes and teeth checked. Stay up to date on all vaccines. This information is not intended to replace advice given to you by your health care provider. Make sure you discuss any questions you have with your health care provider. Document Revised: 05/10/2021 Document Reviewed: 05/10/2021 Elsevier Patient Education  2022 [employer].

## 2023-04-09 NOTE — Progress Notes (Signed)
Subjective:   Hunter Cook is a 27 y.o. male who presents for Medicare Annual/Subsequent preventive examination.  Review of Systems    Virtual Visit via Telephone Note  I connected with  Hunter Cook on 04/09/23 at  3:30 PM EDT by telephone and verified that I am speaking with the correct person using two identifiers.  Location: Patient: Home Provider: Office Persons participating in the virtual visit: patient/Nurse Health Advisor   I discussed the limitations, risks, security and privacy concerns of performing an evaluation and management service by telephone and the availability of in person appointments. The patient expressed understanding and agreed to proceed.  Interactive audio and video telecommunications were attempted between this nurse and patient, however failed, due to patient having technical difficulties OR patient did not have access to video capability.  We continued and completed visit with audio only.  Some vital signs may be absent or patient reported.   Hunter Rung, LPN  Cardiac Risk Factors include: advanced age (>71men, >28 women);male gender;Other (see comment), Risk factor comments: DX: TBI     Objective:    Today's Vitals   04/09/23 1539  Weight: 300 lb (136.1 kg)   Body mass index is 46.99 kg/m.     04/09/2023    3:48 PM 02/23/2022    3:12 PM 06/12/2019    7:12 AM 02/11/2019    5:00 AM 02/10/2019    6:11 PM 01/03/2019    8:00 PM 12/30/2018    3:00 AM  Advanced Directives  Does Patient Have a Medical Advance Directive? No No No No No No No  Would patient like information on creating a medical advance directive? No - Patient declined No - Patient declined No - Patient declined No - Patient declined No - Patient declined No - Patient declined No - Patient declined    Current Medications (verified) Outpatient Encounter Medications as of 04/09/2023  Medication Sig   divalproex (DEPAKOTE) 500 MG DR tablet Take 1 tablet (500 mg total) by  mouth 3 (three) times daily. Take 2 tablets in the morning (1000 mg) and 1 tablet at night (500 mg)   prazosin (MINIPRESS) 2 MG capsule Take 1 capsule (2 mg total) by mouth at bedtime.   propranolol (INDERAL) 20 MG tablet Take 1 tablet (20 mg total) by mouth 2 (two) times daily.   risperiDONE (RISPERDAL) 1 MG tablet Take 1 tablet (1 mg total) by mouth 2 (two) times daily.   No facility-administered encounter medications on file as of 04/09/2023.    Allergies (verified) Lamictal [lamotrigine]   History: Past Medical History:  Diagnosis Date   Bipolar 1 disorder (HCC)    Bipolar disorder (HCC)    Snake bite    Past Surgical History:  Procedure Laterality Date   FOOT SURGERY Right 2015   crush injury - 2 screws and 1 plate surgically placed   FRACTURE SURGERY     HARDWARE REMOVAL Left 01/14/2017   Procedure: Left Ankle Syndesmotic Screw Removal;  Surgeon: Eldred Manges, MD;  Location: [facility] OR;  Service: Orthopedics;  Laterality: Left;   INCISION AND DRAINAGE Right 2012   snake bite to hand; had ~ 6 ORs related to this   INCISION AND DRAINAGE  2012   Multiple procedures due to snake bite   LAPAROTOMY N/A 12/20/2018   Procedure: EXPLORATORY LAPAROTOMY; SPLEENECTOMY;  Surgeon: Axel Filler, MD;  Location: [facility] OR;  Service: General;  Laterality: N/A;   ORIF ANKLE FRACTURE Left 10/22/2016   Procedure:  OPEN REDUCTION INTERNAL FIXATION (ORIF) BIMALLEOLAR ANKLE FRACTURE;  Surgeon: Eldred Manges, MD;  Location: [facility] OR;  Service: Orthopedics;  Laterality: Left;   ORIF ANKLE FRACTURE BIMALLEOLAR Left 10/22/2016   ORIF ANKLE FRACTURE BIMALLEOLAR Left 2017   ORIF ULNAR FRACTURE Left 12/22/2018   Procedure: OPEN REDUCTION INTERNAL FIXATION ULNAR FRACTURE;  Surgeon: Roby Lofts, MD;  Location: [facility] OR;  Service: Orthopedics;  Laterality: Left;   SKIN GRAFT  2012   "related to snake bite"   SKIN GRAFT  2013   when bitten by copperhead snake   TRACHEOSTOMY TUBE PLACEMENT N/A 01/13/2019   Procedure:  TRACHEOSTOMY;  Surgeon: Violeta Gelinas, MD;  Location: [facility] OR;  Service: General;  Laterality: N/A;   Family History  Problem Relation Age of Onset   High blood pressure Mother    High Cholesterol Mother    Heart attack Father    CAD Father    High blood pressure Father    High Cholesterol Father    Sleep apnea Paternal Aunt    Social History   Socioeconomic History   Marital status: Married    Spouse name: Not on file   Number of children: Not on file   Years of education: Not on file   Highest education level: Not on file  Occupational History   Not on file  Tobacco Use   Smoking status: Every Day    Packs/day: 0.50    Years: 8.00    Additional pack years: 0.00    Total pack years: 4.00    Types: Cigarettes   Smokeless tobacco: Never   Tobacco comments:    quit using chewing tobacco  Vaping Use   Vaping Use: Former  Substance and Sexual Activity   Alcohol use: Yes    Comment: (father unclear of true amt - "a lot" socially 3-4 times a week   Drug use: Yes    Types: Marijuana    Comment: rarely   Sexual activity: Yes  Other Topics Concern   Not on file  Social History Narrative   ** Merged History Encounter **       Social Determinants of Health   Financial Resource Strain: Low Risk  (04/09/2023)   Overall Financial Resource Strain (CARDIA)    Difficulty of Paying Living Expenses: Not hard at all  Food Insecurity: No Food Insecurity (04/09/2023)   Hunger Vital Sign    Worried About Running Out of Food in the Last Year: Never true    Ran Out of Food in the Last Year: Never true  Transportation Needs: No Transportation Needs (04/09/2023)   PRAPARE - Administrator, Civil Service (Medical): No    Lack of Transportation (Non-Medical): No  Physical Activity: Inactive (04/09/2023)   Exercise Vital Sign    Days of Exercise per Week: 0 days    Minutes of Exercise per Session: 0 min  Stress: No Stress Concern Present (04/09/2023)   [employer] of  Occupational Health - Occupational Stress Questionnaire    Feeling of Stress : Not at all  Social Connections: Moderately Isolated (04/09/2023)   Social Connection and Isolation Panel [NHANES]    Frequency of Communication with Friends and Family: More than three times a week    Frequency of Social Gatherings with Friends and Family: More than three times a week    Attends Religious Services: Never    Database administrator or Organizations: No    Attends Banker Meetings: Never  Marital Status: Married    Tobacco Counseling Ready to quit: No Counseling given: Not Answered Tobacco comments: quit using chewing tobacco   Clinical Intake:  Pre-visit preparation completed: No  Pain : No/denies pain     BMI - recorded: 46.99 Nutritional Status: BMI > 30  Obese Nutritional Risks: None Diabetes: No  How often do you need to have someone help you when you read instructions, pamphlets, or other written materials from your doctor or pharmacy?: 3 - Sometimes (Wife assist)  Diabetic?  No  Interpreter Needed?: No  Information entered by :: Theresa Mulligan LPN   Activities of Daily Living    04/09/2023    3:45 PM  In your present state of health, do you have any difficulty performing the following activities:  Hearing? 0  Vision? 0  Difficulty concentrating or making decisions? 0  Walking or climbing stairs? 0  Dressing or bathing? 0  Doing errands, shopping? 0  Preparing Food and eating ? N  Using the Toilet? N  In the past six months, have you accidently leaked urine? Y  Comment On occasions.  Do you have problems with loss of bowel control? N  Managing your Medications? N  Managing your Finances? N  Housekeeping or managing your Housekeeping? N    Patient Care Team: Carlean Jews, NP as PCP - General (Family Medicine) Patient, No Pcp Per (General Practice)  Indicate any recent Medical Services you may have received from other than [facility] providers  in the past year (date may be approximate).     Assessment:   This is a routine wellness examination for [place].  Hearing/Vision screen Hearing Screening - Comments:: Denies hearing difficulties   Vision Screening - Comments:: - Not up to date with routine eye exams with  Deferred  Dietary issues and exercise activities discussed: Exercise limited by: None identified   Goals Addressed               This Visit's Progress     Patient stated (pt-stated)        Make more money.       Depression Screen    04/09/2023    3:44 PM 02/25/2023    1:42 PM 04/04/2022    3:13 PM 02/27/2022    9:13 AM 02/21/2022    3:46 PM 01/18/2022    9:09 AM 01/08/2022    2:27 PM  PHQ 2/9 Scores  PHQ - 2 Score 0 2     2  PHQ- 9 Score 0 8     8     Information is confidential and restricted. Go to Review Flowsheets to unlock data.    Fall Risk    04/09/2023    3:47 PM 02/25/2023    1:42 PM 10/09/2021    4:11 PM 07/12/2021   10:21 AM 05/13/2019   11:23 AM  Fall Risk   Falls in the past year? 1 0 0 0 1  Number falls in past yr: 0 0 0 0 1  Comment     last fall 05/11/2019  Injury with Fall? 0 0 0 0   Risk for fall due to : No Fall Risks      Follow up Falls prevention discussed Falls evaluation completed Falls evaluation completed      FALL RISK PREVENTION PERTAINING TO THE HOME:  Any stairs in or around the home? Yes  If so, are there any without handrails? No  Home free of loose throw rugs in walkways, pet beds,  electrical cords, etc? Yes  Adequate lighting in your home to reduce risk of falls? Yes   ASSISTIVE DEVICES UTILIZED TO PREVENT FALLS:  Life alert? No  Use of a cane, walker or w/c? No  Grab bars in the bathroom? No  Shower chair or bench in shower? No  Elevated toilet seat or a handicapped toilet? No   TIMED UP AND GO:  Was the test performed? No . Audio Visit   Cognitive Function:        04/09/2023    3:48 PM  6CIT Screen  What Year? 0 points  What month? 0 points   What time? 0 points  Count back from 20 0 points  Months in reverse 0 points  Repeat phrase 8 points  Total Score 8 points    Immunizations Immunization History  Administered Date(s) Administered   HIB (PRP-OMP) 01/19/2019   HIB (PRP-T) 01/19/2019   Influenza Split 01/16/2019   Influenza,inj,Quad PF,6+ Mos 01/16/2019   Meningococcal Conjugate 01/19/2019   Meningococcal Mcv4o 01/19/2019   Pneumococcal Conjugate-13 01/19/2019   Pneumococcal Polysaccharide-23 01/16/2019   Tdap 12/19/2018    TDAP status: Up to date  Flu Vaccine status: Up to date    Covid-19 vaccine status: Declined, Education has been provided regarding the importance of this vaccine but patient still declined. Advised may receive this vaccine at local pharmacy or Health Dept.or vaccine clinic. Aware to provide a copy of the vaccination record if obtained from local pharmacy or Health Dept. Verbalized acceptance and understanding.  Qualifies for Shingles Vaccine? No   Zostavax completed No   Shingrix Completed?: No.    Education has been provided regarding the importance of this vaccine. Patient has been advised to call insurance company to determine out of pocket expense if they have not yet received this vaccine. Advised may also receive vaccine at local pharmacy or Health Dept. Verbalized acceptance and understanding.  Screening Tests Health Maintenance  Topic Date Due   COVID-19 Vaccine (1) 04/25/2023 (Originally 12/31/1998)   Hepatitis C Screening  04/08/2024 (Originally 01/01/2012)   INFLUENZA VACCINE  06/27/2023   Medicare Annual Wellness (AWV)  04/08/2024   DTaP/Tdap/Td (2 - Td or Tdap) 12/19/2028   HIV Screening  Completed   HPV VACCINES  Aged Out    Health Maintenance  There are no preventive care reminders to display for this patient.     Lung Cancer Screening: (Low Dose CT Chest recommended if Age 8-80 years, 30 pack-year currently smoking OR have quit w/in 15years.) does qualify.   Lung  Cancer Screening Referral: Deferred  Additional Screening:  Hepatitis C Screening: does qualify;  Deferred  Vision Screening: Recommended annual ophthalmology exams for early detection of glaucoma and other disorders of the eye. Is the patient up to date with their annual eye exam?  No  Who is the provider or what is the name of the office in which the patient attends annual eye exams? Deferred If pt is not established with a provider, would they like to be referred to a provider to establish care? No .   Dental Screening: Recommended annual dental exams for proper oral hygiene  Community Resource Referral / Chronic Care Management:  CRR required this visit?  No   CCM required this visit?  No      Plan:     I have personally reviewed and noted the following in the patient's chart:   Medical and social history Use of alcohol, tobacco or illicit drugs  Current medications and  supplements including opioid prescriptions. Patient is not currently taking opioid prescriptions. Functional ability and status Nutritional status Physical activity Advanced directives List of other physicians Hospitalizations, surgeries, and ER visits in previous 12 months Vitals Screenings to include cognitive, depression, and falls Referrals and appointments  In addition, I have reviewed and discussed with patient certain preventive protocols, quality metrics, and best practice recommendations. A written personalized care plan for preventive services as well as general preventive health recommendations were provided to patient.     Hunter Rung, LPN   03/04/8118   Nurse Notes: Patient due Hep-C Screening

## 2023-04-24 ENCOUNTER — Encounter (HOSPITAL_COMMUNITY): Payer: Medicare Other | Admitting: Student in an Organized Health Care Education/Training Program

## 2023-04-24 ENCOUNTER — Ambulatory Visit: Payer: Medicare Other

## 2023-04-24 NOTE — Progress Notes (Signed)
This encounter was created in error - please disregard.

## 2023-06-11 ENCOUNTER — Other Ambulatory Visit (HOSPITAL_COMMUNITY): Payer: Self-pay | Admitting: *Deleted

## 2023-06-11 ENCOUNTER — Telehealth (HOSPITAL_COMMUNITY): Payer: Self-pay | Admitting: *Deleted

## 2023-06-11 DIAGNOSIS — F411 Generalized anxiety disorder: Secondary | ICD-10-CM

## 2023-06-11 DIAGNOSIS — F4310 Post-traumatic stress disorder, unspecified: Secondary | ICD-10-CM

## 2023-06-11 DIAGNOSIS — F3132 Bipolar disorder, current episode depressed, moderate: Secondary | ICD-10-CM

## 2023-06-11 DIAGNOSIS — R4689 Other symptoms and signs involving appearance and behavior: Secondary | ICD-10-CM

## 2023-06-11 MED ORDER — PRAZOSIN HCL 2 MG PO CAPS
2.0000 mg | ORAL_CAPSULE | Freq: Every day | ORAL | 1 refills | Status: DC
Start: 2023-06-11 — End: 2024-01-28

## 2023-06-11 MED ORDER — RISPERIDONE 1 MG PO TABS
1.0000 mg | ORAL_TABLET | Freq: Two times a day (BID) | ORAL | 1 refills | Status: DC
Start: 2023-06-11 — End: 2024-01-28

## 2023-06-11 MED ORDER — DIVALPROEX SODIUM 500 MG PO DR TAB
500.0000 mg | DELAYED_RELEASE_TABLET | Freq: Three times a day (TID) | ORAL | 1 refills | Status: DC
Start: 2023-06-11 — End: 2024-01-28

## 2023-06-11 MED ORDER — PROPRANOLOL HCL 20 MG PO TABS
20.0000 mg | ORAL_TABLET | Freq: Two times a day (BID) | ORAL | 1 refills | Status: DC
Start: 2023-06-11 — End: 2024-01-28

## 2023-06-11 NOTE — Telephone Encounter (Signed)
Okay to refill until next scheduled appointment.

## 2023-06-11 NOTE — Telephone Encounter (Signed)
Former pt of dr. Renaldo Fiddler who called for refills. Pt has an appointment scheduled with another resident (Dr. Jerrel Ivory) on 08/12/23. Ok to send bridge on all meds? Please review and advise.

## 2023-06-20 ENCOUNTER — Other Ambulatory Visit: Payer: Medicare Other

## 2023-06-27 ENCOUNTER — Encounter: Payer: Medicare Other | Admitting: Nurse Practitioner

## 2023-07-25 ENCOUNTER — Other Ambulatory Visit: Payer: Self-pay

## 2023-07-25 ENCOUNTER — Encounter: Payer: Self-pay | Admitting: Family Medicine

## 2023-07-25 ENCOUNTER — Ambulatory Visit (INDEPENDENT_AMBULATORY_CARE_PROVIDER_SITE_OTHER): Payer: Medicare Other | Admitting: Family Medicine

## 2023-07-25 ENCOUNTER — Encounter (HOSPITAL_COMMUNITY): Payer: Self-pay

## 2023-07-25 ENCOUNTER — Emergency Department (HOSPITAL_COMMUNITY)
Admission: EM | Admit: 2023-07-25 | Discharge: 2023-07-25 | Discharge status: Against Medical Advice | Payer: Medicare Other | Attending: Student | Admitting: Student

## 2023-07-25 ENCOUNTER — Emergency Department (HOSPITAL_COMMUNITY): Payer: Medicare Other

## 2023-07-25 VITALS — BP 112/79 | HR 83 | Ht 67.0 in | Wt 290.8 lb

## 2023-07-25 DIAGNOSIS — R221 Localized swelling, mass and lump, neck: Secondary | ICD-10-CM | POA: Diagnosis present

## 2023-07-25 DIAGNOSIS — R220 Localized swelling, mass and lump, head: Secondary | ICD-10-CM | POA: Diagnosis not present

## 2023-07-25 DIAGNOSIS — Z5321 Procedure and treatment not carried out due to patient leaving prior to being seen by health care provider: Secondary | ICD-10-CM | POA: Insufficient documentation

## 2023-07-25 DIAGNOSIS — K1121 Acute sialoadenitis: Secondary | ICD-10-CM

## 2023-07-25 LAB — CBC WITH DIFFERENTIAL/PLATELET
Abs Immature Granulocytes: 0.04 10*3/uL (ref 0.00–0.07)
Basophils Absolute: 0.1 10*3/uL (ref 0.0–0.1)
Basophils Relative: 1 %
Eosinophils Absolute: 0.1 10*3/uL (ref 0.0–0.5)
Eosinophils Relative: 1 %
HCT: 48.1 % (ref 39.0–52.0)
Hemoglobin: 16.2 g/dL (ref 13.0–17.0)
Immature Granulocytes: 0 %
Lymphocytes Relative: 36 %
Lymphs Abs: 4 10*3/uL (ref 0.7–4.0)
MCH: 29.8 pg (ref 26.0–34.0)
MCHC: 33.7 g/dL (ref 30.0–36.0)
MCV: 88.4 fL (ref 80.0–100.0)
Monocytes Absolute: 0.7 10*3/uL (ref 0.1–1.0)
Monocytes Relative: 7 %
Neutro Abs: 6.1 10*3/uL (ref 1.7–7.7)
Neutrophils Relative %: 55 %
Platelets: 538 10*3/uL — ABNORMAL HIGH (ref 150–400)
RBC: 5.44 MIL/uL (ref 4.22–5.81)
RDW: 14.3 % (ref 11.5–15.5)
WBC: 11.1 10*3/uL — ABNORMAL HIGH (ref 4.0–10.5)
nRBC: 0 % (ref 0.0–0.2)

## 2023-07-25 LAB — BASIC METABOLIC PANEL
Anion gap: 13 (ref 5–15)
BUN: 12 mg/dL (ref 6–20)
CO2: 22 mmol/L (ref 22–32)
Calcium: 9.2 mg/dL (ref 8.9–10.3)
Chloride: 100 mmol/L (ref 98–111)
Creatinine, Ser: 0.91 mg/dL (ref 0.61–1.24)
GFR, Estimated: >60 mL/min (ref >60)
Glucose, Bld: 92 mg/dL (ref 70–99)
Potassium: 4.5 mmol/L (ref 3.5–5.1)
Sodium: 135 mmol/L (ref 135–145)

## 2023-07-25 MED ORDER — IOHEXOL 350 MG/ML SOLN
75.0000 mL | Freq: Once | INTRAVENOUS | Status: AC | PRN
  Administered 2023-07-25: 75 mL via INTRAVENOUS

## 2023-07-25 NOTE — Patient Instructions (Signed)
It was nice to see you today,  We addressed the following topics today: Your swelling can be due to an infection or abscess, if this increases in size it can obstruct your airway and cause difficulty breathing.  Therefore I would like you to go to the emergency department so they can evaluate with a CT scan and blood work to look for infection.  Have a great day,  Frederic Jericho, MD

## 2023-07-25 NOTE — ED Notes (Signed)
Pt wanted to leave and was taken OTF.

## 2023-07-25 NOTE — Assessment & Plan Note (Signed)
Patient presents with 1 day of submandibular swelling, tenderness and 1 week of shooting pains in the tongue.  I do not see any evidence of oral infection, but given the concern for worsening swelling leading to airway obstruction and for possible Ludewig's angina, recommended the patient go to the emergency department so that he can get a CT scan and expedited blood work.  Patient's wife will drive him there.

## 2023-07-25 NOTE — ED Provider Triage Note (Signed)
Emergency Medicine Provider Triage Evaluation Note  Brenham Rayner , a 27 y.o. male  was evaluated in triage.  Pt complains of swelling on the left side of his neck. Felt like something popped under his tongue yesterday and has since noted worsening welling and tenderness.  Review of Systems  Positive: Neck pain, neck swelling Negative: Fevers, dental pain  Physical Exam  BP 133/87 (BP Location: Right Arm)   Pulse 94   Temp 98.4 F (36.9 C) (Oral)   Resp 20   Ht 5\' 7"  (1.702 m)   Wt 131.5 kg   SpO2 95%   BMI 45.42 kg/m  Gen:   Awake, no distress   Resp:  Normal effort  MSK:   Moves extremities without difficulty  Other:  Swelling to underside of   Medical Decision Making  Medically screening exam initiated at 10:32 AM.  Appropriate orders placed.  Zandon Pinell was informed that the remainder of the evaluation will be completed by another provider, this initial triage assessment does not replace that evaluation, and the importance of remaining in the ED until their evaluation is complete.  Labs and CT ordered   Dartha Lodge, [name] 07/25/23 1035

## 2023-07-25 NOTE — ED Triage Notes (Signed)
Patient reports a sharp pain under his tongue for the past few days and then felt a pop yesterday and now has moderate swelling to left side of jaw/neck that is tender to palpate.  Reports pain difficulty swallowing but still controlling secretions.
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
## 2023-07-30 NOTE — Addendum Note (Signed)
Addended by: Saralyn Pilar on: 07/30/2023 01:13 PM   Modules accepted: Orders

## 2023-08-08 ENCOUNTER — Ambulatory Visit: Payer: Medicare Other | Admitting: Family Medicine

## 2023-08-08 NOTE — Progress Notes (Deleted)
   Established Patient Office Visit  Subjective   Patient ID: Gaza Loredo, male    DOB: 05-Oct-1996  Age: 27 y.o. MRN: [id number]  No chief complaint on file.   HPI  Sialadenitis - steroids and abx?  sialogogues   The ASCVD Risk score (Arnett DK, et al., 2019) failed to calculate for the following reasons:   The 2019 ASCVD risk score is only valid for ages 62 to 8  Health Maintenance Due  Topic Date Due   COVID-19 Vaccine (1) Never done   Hepatitis C Screening  Never done   INFLUENZA VACCINE  06/27/2023      Objective:     There were no vitals taken for this visit. {Vitals History (Optional):23777}  Physical Exam   No results found for any visits on 08/08/23.      Assessment & Plan:   There are no diagnoses linked to this encounter.   No follow-ups on file.    Sandre Kitty, MD

## 2023-08-12 ENCOUNTER — Ambulatory Visit (HOSPITAL_COMMUNITY): Payer: Medicare Other | Admitting: Student

## 2023-08-27 ENCOUNTER — Encounter (HOSPITAL_COMMUNITY): Payer: Self-pay

## 2023-08-27 ENCOUNTER — Emergency Department (HOSPITAL_COMMUNITY): Payer: Medicare Other

## 2023-08-27 ENCOUNTER — Emergency Department (HOSPITAL_COMMUNITY)
Admission: EM | Admit: 2023-08-27 | Discharge: 2023-08-27 | Disposition: A | Discharge status: Home / Self Care | Payer: Medicare Other | Attending: Emergency Medicine | Admitting: Emergency Medicine

## 2023-08-27 ENCOUNTER — Other Ambulatory Visit: Payer: Self-pay

## 2023-08-27 DIAGNOSIS — S4992XA Unspecified injury of left shoulder and upper arm, initial encounter: Secondary | ICD-10-CM | POA: Diagnosis present

## 2023-08-27 DIAGNOSIS — S40012A Contusion of left shoulder, initial encounter: Secondary | ICD-10-CM | POA: Insufficient documentation

## 2023-08-27 DIAGNOSIS — R0789 Other chest pain: Secondary | ICD-10-CM | POA: Diagnosis not present

## 2023-08-27 DIAGNOSIS — R0602 Shortness of breath: Secondary | ICD-10-CM | POA: Diagnosis not present

## 2023-08-27 DIAGNOSIS — W19XXXA Unspecified fall, initial encounter: Secondary | ICD-10-CM

## 2023-08-27 DIAGNOSIS — W1839XA Other fall on same level, initial encounter: Secondary | ICD-10-CM | POA: Insufficient documentation

## 2023-08-27 LAB — BASIC METABOLIC PANEL
Anion gap: 13 (ref 5–15)
BUN: 15 mg/dL (ref 6–20)
CO2: 20 mmol/L — ABNORMAL LOW (ref 22–32)
Calcium: 8.9 mg/dL (ref 8.9–10.3)
Chloride: 103 mmol/L (ref 98–111)
Creatinine, Ser: 1.12 mg/dL (ref 0.61–1.24)
GFR, Estimated: >60 mL/min (ref >60)
Glucose, Bld: 179 mg/dL — ABNORMAL HIGH (ref 70–99)
Potassium: 4 mmol/L (ref 3.5–5.1)
Sodium: 136 mmol/L (ref 135–145)

## 2023-08-27 LAB — CBC
HCT: 52.3 % — ABNORMAL HIGH (ref 39.0–52.0)
Hemoglobin: 17.2 g/dL — ABNORMAL HIGH (ref 13.0–17.0)
MCH: 30.6 pg (ref 26.0–34.0)
MCHC: 32.9 g/dL (ref 30.0–36.0)
MCV: 93.1 fL (ref 80.0–100.0)
Platelets: 454 10*3/uL — ABNORMAL HIGH (ref 150–400)
RBC: 5.62 MIL/uL (ref 4.22–5.81)
RDW: 15.4 % (ref 11.5–15.5)
WBC: 7.4 10*3/uL (ref 4.0–10.5)
nRBC: 0 % (ref 0.0–0.2)

## 2023-08-27 LAB — TROPONIN I (HIGH SENSITIVITY): Troponin I (High Sensitivity): <2 ng/L (ref <18)

## 2023-08-27 MED ORDER — CYCLOBENZAPRINE HCL 10 MG PO TABS
10.0000 mg | ORAL_TABLET | Freq: Once | ORAL | Status: AC
  Administered 2023-08-27: 10 mg via ORAL
  Filled 2023-08-27: qty 1

## 2023-08-27 MED ORDER — CYCLOBENZAPRINE HCL 10 MG PO TABS: 10.0000 mg | ORAL_TABLET | Freq: Two times a day (BID) | ORAL | 0 refills | Status: DC | PRN

## 2023-08-27 MED ORDER — KETOROLAC TROMETHAMINE 60 MG/2ML IM SOLN
60.0000 mg | Freq: Once | INTRAMUSCULAR | Status: AC
  Administered 2023-08-27: 60 mg via INTRAMUSCULAR
  Filled 2023-08-27: qty 2

## 2023-08-27 MED ORDER — ONDANSETRON 4 MG PO TBDP
4.0000 mg | ORAL_TABLET | Freq: Once | ORAL | Status: AC
  Administered 2023-08-27: 4 mg via ORAL
  Filled 2023-08-27: qty 1
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
## 2023-08-27 NOTE — ED Triage Notes (Signed)
Pt came in via POV d/t falling a week ago from standing & landing on his Lt shoulder. Reports it still hurts & he is having CP & feeling like its hard to breath since then. A/Ox4, rates his pain 8/10 while in triage.

## 2023-08-27 NOTE — Discharge Instructions (Signed)
X-rays look okay.  You are given a prescription for muscle relaxer but you can also take Tylenol for pain.  Try Voltaren gel which is now over-the-counter you can rub it where it hurts which can also be very helpful.  Also a heating pad or ice pack may be helpful as well.

## 2023-10-14 ENCOUNTER — Emergency Department (HOSPITAL_COMMUNITY)
Admission: EM | Admit: 2023-10-14 | Discharge: 2023-10-14 | Disposition: A | Discharge status: Home / Self Care | Payer: Medicare Other | Attending: Emergency Medicine | Admitting: Emergency Medicine

## 2023-10-14 ENCOUNTER — Other Ambulatory Visit: Payer: Self-pay

## 2023-10-14 ENCOUNTER — Encounter (HOSPITAL_COMMUNITY): Payer: Self-pay | Admitting: *Deleted

## 2023-10-14 DIAGNOSIS — K115 Sialolithiasis: Secondary | ICD-10-CM | POA: Insufficient documentation

## 2023-10-14 DIAGNOSIS — R6884 Jaw pain: Secondary | ICD-10-CM | POA: Diagnosis present

## 2023-10-14 MED ORDER — KETOROLAC TROMETHAMINE 15 MG/ML IJ SOLN
15.0000 mg | Freq: Once | INTRAMUSCULAR | Status: AC
  Administered 2023-10-14: 15 mg via INTRAMUSCULAR
  Filled 2023-10-14: qty 1

## 2023-10-14 MED ORDER — MORPHINE SULFATE 15 MG PO TABS: 7.5000 mg | ORAL_TABLET | ORAL | 0 refills | Status: DC | PRN

## 2023-10-14 NOTE — Discharge Instructions (Addendum)
Take 4 over the counter ibuprofen tablets 3 times a day or 2 over-the-counter naproxen tablets twice a day for pain. Also take tylenol 1000mg(2 extra strength) four times a day.   Then take the pain medicine if you feel like you need it. Narcotics do not help with the pain, they only make you care about it less.  You can become addicted to this, people may break into your house to steal it.  It will constipate you.  If you drive under the influence of this medicine you can get a DUI.    

## 2023-10-14 NOTE — ED Provider Notes (Signed)
[name] EMERGENCY DEPARTMENT AT [facility] Provider Note   CSN: [id number] Arrival date & time: 10/14/23  0536     History  Chief Complaint  Patient presents with   Jaw Pain    Hunter Cook is a 27 y.o. male.  27 yo M with a chief complaint of left-sided jaw pain.  This been going on for at least a couple months.  He had seen his primary care provider and was diagnosed with a likely salivary duct stone.  He had an appointment to see ear nose and throat in the office but was unfortunately unable to make it.  Feels like since then his pain is gotten a bit worse.  He has trouble eating and swallowing.  He denies any fevers.  Denies trauma.        Home Medications Prior to Admission medications   Medication Sig Start Date End Date Taking? Authorizing Provider  morphine (MSIR) 15 MG tablet Take 0.5 tablets (7.5 mg total) by mouth every 4 (four) hours as needed. 10/14/23  Yes Melene Plan, DO  cyclobenzaprine (FLEXERIL) 10 MG tablet Take 1 tablet (10 mg total) by mouth 2 (two) times daily as needed. 08/27/23   Gwyneth Sprout, MD  divalproex (DEPAKOTE) 500 MG DR tablet Take 1 tablet (500 mg total) by mouth 3 (three) times daily. Take 2 tablets in the morning (1000 mg) and 1 tablet at night (500 mg) 06/11/23   Arfeen, Phillips Grout, MD  prazosin (MINIPRESS) 2 MG capsule Take 1 capsule (2 mg total) by mouth at bedtime. 06/11/23   Arfeen, Phillips Grout, MD  propranolol (INDERAL) 20 MG tablet Take 1 tablet (20 mg total) by mouth 2 (two) times daily. 06/11/23   Arfeen, Phillips Grout, MD  risperiDONE (RISPERDAL) 1 MG tablet Take 1 tablet (1 mg total) by mouth 2 (two) times daily. 06/11/23   Arfeen, Phillips Grout, MD      Allergies    Lamictal [lamotrigine]    Review of Systems   Review of Systems  Physical Exam Updated Vital Signs BP (!) 147/117 (BP Location: Right Arm)   Pulse 98   Temp 98.2 F (36.8 C) (Oral)   Resp 18   Ht 5\' 7"  (1.702 m)   Wt 136.1 kg   SpO2 99%   BMI 46.99 kg/m   Physical Exam Vitals and nursing note reviewed.  Constitutional:      Appearance: He is well-developed.  HENT:     Head: Normocephalic and atraumatic.     Mouth/Throat:     Comments: Fullness under the mandible without any obvious fluctuance or induration.  No overlying erythema.   No trismus, uvula is midline tolerating shoes without difficulty no sublingual swelling.  Poor dentition diffusely. Eyes:     Pupils: Pupils are equal, round, and reactive to light.  Neck:     Vascular: No JVD.  Cardiovascular:     Rate and Rhythm: Normal rate and regular rhythm.     Heart sounds: No murmur heard.    No friction rub. No gallop.  Pulmonary:     Effort: No respiratory distress.     Breath sounds: No wheezing.  Abdominal:     General: There is no distension.     Tenderness: There is no abdominal tenderness. There is no guarding or rebound.  Musculoskeletal:        General: Normal range of motion.     Cervical back: Normal range of motion and neck supple.  Skin:  Coloration: Skin is not pale.     Findings: No rash.  Neurological:     Mental Status: He is alert and oriented to person, place, and time.  Psychiatric:        Behavior: Behavior normal.     ED Results / Procedures / Treatments   Labs (all labs ordered are listed, but only abnormal results are displayed) Labs Reviewed - No data to display  EKG None  Radiology No results found.  Procedures Procedures    Medications Ordered in ED Medications  ketorolac (TORADOL) 15 MG/ML injection 15 mg (has no administration in time range)    ED Course/ Medical Decision Making/ A&P                                 Medical Decision Making Risk Prescription drug management.   27 yo M with a chief complaints of left-sided jaw pain.  Patient been seen by his primary care provider and was diagnosed with a salivary duct stone.  I do agree this is the most likely diagnosis.  He had symptoms for couple months.  Seems  unlikely to be deep space infection, he is afebrile, no tachycardia.  No trismus no difficulty tolerating secretions able to rotate his head without pain.  Will treat presumptively as a salivary duct stone.  Given ENT follow-up.  Encouraged outpatient follow-up.  8:18 AM:  I have discussed the diagnosis/risks/treatment options with the patient.  Evaluation and diagnostic testing in the emergency department does not suggest an emergent condition requiring admission or immediate intervention beyond what has been performed at this time.  They will follow up with ENT. We also discussed returning to the ED immediately if new or worsening sx occur. We discussed the sx which are most concerning (e.g., sudden worsening pain, fever, inability to tolerate by mouth) that necessitate immediate return. Medications administered to the patient during their visit and any new prescriptions provided to the patient are listed below.  Medications given during this visit Medications  ketorolac (TORADOL) 15 MG/ML injection 15 mg (has no administration in time range)     The patient appears reasonably screen and/or stabilized for discharge and I doubt any other medical condition or other [facility] requiring further screening, evaluation, or treatment in the ED at this time prior to discharge.          Final Clinical Impression(s) / ED Diagnoses Final diagnoses:  Sialolithiasis    Rx / DC Orders ED Discharge Orders          Ordered    morphine (MSIR) 15 MG tablet  Every 4 hours PRN        10/14/23 0815              Melene Plan, DO 10/14/23 0818

## 2023-10-14 NOTE — ED Triage Notes (Signed)
States he saw his PCP 2 months ago with c/o pain in his left lower jaw was told prob. Salivary stone was told to follow up with ENT however missed appointment , states pain is worse and worse when he eats. C/o difficluty breathing at times

## 2023-11-18 ENCOUNTER — Ambulatory Visit (INDEPENDENT_AMBULATORY_CARE_PROVIDER_SITE_OTHER): Payer: Medicare Other | Admitting: Otolaryngology

## 2023-11-18 ENCOUNTER — Encounter (INDEPENDENT_AMBULATORY_CARE_PROVIDER_SITE_OTHER): Payer: Self-pay | Admitting: Otolaryngology

## 2023-11-18 VITALS — BP 145/101 | HR 64 | Ht 67.0 in | Wt 280.0 lb

## 2023-11-18 DIAGNOSIS — F17210 Nicotine dependence, cigarettes, uncomplicated: Secondary | ICD-10-CM

## 2023-11-18 DIAGNOSIS — K115 Sialolithiasis: Secondary | ICD-10-CM | POA: Diagnosis not present

## 2023-11-18 DIAGNOSIS — K1123 Chronic sialoadenitis: Secondary | ICD-10-CM

## 2023-11-18 DIAGNOSIS — F17200 Nicotine dependence, unspecified, uncomplicated: Secondary | ICD-10-CM
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
## 2023-11-29 ENCOUNTER — Other Ambulatory Visit: Payer: Self-pay

## 2023-11-29 ENCOUNTER — Encounter (HOSPITAL_COMMUNITY): Payer: Self-pay

## 2023-11-29 NOTE — Progress Notes (Signed)
 SDW call  Patient was given pre-op instructions over the phone. Patient verbalized understanding of instructions provided.     PCP - Dr. Toribio Slain Cardiologist - denies Pulmonary:    PPM/ICD - denies Device Orders - na Rep Notified - na   Chest x-ray - 08/27/2023 EKG -  08/28/2023 Stress Test - ECHO -  Cardiac Cath -   Sleep Study/sleep apnea/CPAP: denies  Non-diabetic  Blood Thinner Instructions: denies Aspirin  Instructions:denies   ERAS Protcol - NPO   Anesthesia review: No   Patient denies shortness of breath, fever, cough and chest pain over the phone call  Your procedure is scheduled on Tuesday December 03, 2023   Report to [facility] Main Entrance A at  1245 PM, then check in with the Admitting office.  Call this number if you have problems the morning of surgery:  [phone]   If you have any questions prior to your surgery date call [phone]: Open Monday-Friday 8am-4pm If you experience any cold or flu symptoms such as cough, fever, chills, shortness of breath, etc. between now and your scheduled surgery, please notify us  at the above number    Remember:  Do not eat or drink after midnight the night before your surgery  Take these medicines the morning of surgery with A SIP OF WATER : None  As of today, STOP taking any Aspirin  (unless otherwise instructed by your surgeon) Aleve, Naproxen, Ibuprofen , Motrin , Advil , Goody's, BC's, all herbal medications, fish oil, and all vitamins.

## 2023-12-03 ENCOUNTER — Ambulatory Visit (HOSPITAL_COMMUNITY): Payer: Medicare Other | Admitting: Anesthesiology

## 2023-12-03 ENCOUNTER — Other Ambulatory Visit: Payer: Self-pay

## 2023-12-03 ENCOUNTER — Encounter (HOSPITAL_COMMUNITY)
Admission: RE | Disposition: A | Discharge status: Home / Self Care | Payer: Self-pay | Source: Home / Self Care | Attending: Otolaryngology

## 2023-12-03 ENCOUNTER — Ambulatory Visit (HOSPITAL_COMMUNITY)
Admission: RE | Admit: 2023-12-03 | Discharge: 2023-12-03 | Disposition: A | Discharge status: Home / Self Care | Payer: Medicare Other | Attending: Otolaryngology | Admitting: Otolaryngology

## 2023-12-03 ENCOUNTER — Encounter (HOSPITAL_COMMUNITY): Payer: Self-pay | Admitting: *Deleted

## 2023-12-03 DIAGNOSIS — F319 Bipolar disorder, unspecified: Secondary | ICD-10-CM | POA: Diagnosis not present

## 2023-12-03 DIAGNOSIS — F17210 Nicotine dependence, cigarettes, uncomplicated: Secondary | ICD-10-CM | POA: Insufficient documentation

## 2023-12-03 DIAGNOSIS — K1123 Chronic sialoadenitis: Secondary | ICD-10-CM | POA: Insufficient documentation

## 2023-12-03 DIAGNOSIS — Z9081 Acquired absence of spleen: Secondary | ICD-10-CM | POA: Insufficient documentation

## 2023-12-03 DIAGNOSIS — E66813 Obesity, class 3: Secondary | ICD-10-CM | POA: Diagnosis not present

## 2023-12-03 DIAGNOSIS — Z6841 Body Mass Index (BMI) 40.0 and over, adult: Secondary | ICD-10-CM | POA: Insufficient documentation

## 2023-12-03 DIAGNOSIS — K115 Sialolithiasis: Secondary | ICD-10-CM | POA: Diagnosis present

## 2023-12-03 HISTORY — PX: SALIVARY STONE REMOVAL: SHX5213

## 2023-12-03 HISTORY — DX: Anxiety disorder, unspecified: F41.9

## 2023-12-03 HISTORY — DX: Depression, unspecified: F32.A

## 2023-12-03 LAB — COMPREHENSIVE METABOLIC PANEL
ALT: 31 U/L (ref 0–44)
AST: 26 U/L (ref 15–41)
Albumin: 4.3 g/dL (ref 3.5–5.0)
Alkaline Phosphatase: 68 U/L (ref 38–126)
Anion gap: 14 (ref 5–15)
BUN: 7 mg/dL (ref 6–20)
CO2: 20 mmol/L — ABNORMAL LOW (ref 22–32)
Calcium: 9.5 mg/dL (ref 8.9–10.3)
Chloride: 103 mmol/L (ref 98–111)
Creatinine, Ser: 0.86 mg/dL (ref 0.61–1.24)
GFR, Estimated: >60 mL/min (ref >60)
Glucose, Bld: 84 mg/dL (ref 70–99)
Potassium: 4.4 mmol/L (ref 3.5–5.1)
Sodium: 137 mmol/L (ref 135–145)
Total Bilirubin: 0.8 mg/dL (ref 0.0–1.2)
Total Protein: 7.5 g/dL (ref 6.5–8.1)

## 2023-12-03 LAB — CBC
HCT: 50 % (ref 39.0–52.0)
Hemoglobin: 17 g/dL (ref 13.0–17.0)
MCH: 31.2 pg (ref 26.0–34.0)
MCHC: 34 g/dL (ref 30.0–36.0)
MCV: 91.7 fL (ref 80.0–100.0)
Platelets: 464 10*3/uL — ABNORMAL HIGH (ref 150–400)
RBC: 5.45 MIL/uL (ref 4.22–5.81)
RDW: 14.8 % (ref 11.5–15.5)
WBC: 9.5 10*3/uL (ref 4.0–10.5)
nRBC: 0 % (ref 0.0–0.2)

## 2023-12-03 SURGERY — REMOVAL, CALCULUS, SALIVARY DUCT
Anesthesia: General | Laterality: Left

## 2023-12-03 MED ORDER — OXYCODONE HCL 5 MG/5ML PO SOLN: 5.0000 mg | Freq: Once | ORAL | Status: DC | PRN

## 2023-12-03 MED ORDER — PHENYLEPHRINE 80 MCG/ML (10ML) SYRINGE FOR IV PUSH (FOR BLOOD PRESSURE SUPPORT)
PREFILLED_SYRINGE | INTRAVENOUS | Status: AC
  Filled 2023-12-03: qty 20

## 2023-12-03 MED ORDER — DEXMEDETOMIDINE HCL IN NACL 200 MCG/50ML IV SOLN
INTRAVENOUS | Status: DC | PRN
  Administered 2023-12-03: 8 ug via INTRAVENOUS
  Administered 2023-12-03: 12 ug via INTRAVENOUS

## 2023-12-03 MED ORDER — MIDAZOLAM HCL 2 MG/2ML IJ SOLN
INTRAMUSCULAR | Status: DC | PRN
  Administered 2023-12-03: 2 mg via INTRAVENOUS

## 2023-12-03 MED ORDER — FENTANYL CITRATE (PF) 250 MCG/5ML IJ SOLN
INTRAMUSCULAR | Status: AC
  Filled 2023-12-03: qty 5

## 2023-12-03 MED ORDER — ESMOLOL HCL 100 MG/10ML IV SOLN
INTRAVENOUS | Status: DC | PRN
  Administered 2023-12-03: 20 mg via INTRAVENOUS

## 2023-12-03 MED ORDER — FENTANYL CITRATE (PF) 250 MCG/5ML IJ SOLN
INTRAMUSCULAR | Status: DC | PRN
  Administered 2023-12-03 (×2): 50 ug via INTRAVENOUS
  Administered 2023-12-03: 100 ug via INTRAVENOUS
  Administered 2023-12-03: 50 ug via INTRAVENOUS

## 2023-12-03 MED ORDER — ONDANSETRON HCL 4 MG/2ML IJ SOLN
INTRAMUSCULAR | Status: AC
  Filled 2023-12-03: qty 2

## 2023-12-03 MED ORDER — LIDOCAINE-EPINEPHRINE 1 %-1:100000 IJ SOLN
INTRAMUSCULAR | Status: AC
  Filled 2023-12-03: qty 1

## 2023-12-03 MED ORDER — PROPOFOL 10 MG/ML IV BOLUS
INTRAVENOUS | Status: AC
  Filled 2023-12-03: qty 20

## 2023-12-03 MED ORDER — ACETAMINOPHEN 10 MG/ML IV SOLN: 1000.0000 mg | Freq: Once | INTRAVENOUS | Status: DC | PRN

## 2023-12-03 MED ORDER — CHLORHEXIDINE GLUCONATE 0.12 % MT SOLN
15.0000 mL | Freq: Once | OROMUCOSAL | Status: AC
  Administered 2023-12-03: 15 mL via OROMUCOSAL
  Filled 2023-12-03: qty 15

## 2023-12-03 MED ORDER — OXYCODONE HCL 5 MG PO TABS: 5.0000 mg | ORAL_TABLET | Freq: Once | ORAL | Status: DC | PRN

## 2023-12-03 MED ORDER — ROCURONIUM BROMIDE 10 MG/ML (PF) SYRINGE
PREFILLED_SYRINGE | INTRAVENOUS | Status: AC
  Filled 2023-12-03: qty 10

## 2023-12-03 MED ORDER — ACETAMINOPHEN 10 MG/ML IV SOLN
INTRAVENOUS | Status: AC
  Filled 2023-12-03: qty 100

## 2023-12-03 MED ORDER — DEXAMETHASONE SODIUM PHOSPHATE 10 MG/ML IJ SOLN
INTRAMUSCULAR | Status: DC | PRN
  Administered 2023-12-03: 5 mg via INTRAVENOUS

## 2023-12-03 MED ORDER — SODIUM CHLORIDE 0.9 % IV SOLN
3.0000 g | INTRAVENOUS | Status: AC
  Administered 2023-12-03: 3 g via INTRAVENOUS
  Filled 2023-12-03: qty 8

## 2023-12-03 MED ORDER — ACETAMINOPHEN 500 MG PO TABS: 500.0000 mg | ORAL_TABLET | Freq: Four times a day (QID) | ORAL | 0 refills | Status: DC

## 2023-12-03 MED ORDER — AMOXICILLIN-POT CLAVULANATE 875-125 MG PO TABS: 1.0000 | ORAL_TABLET | Freq: Two times a day (BID) | ORAL | 0 refills | Status: AC

## 2023-12-03 MED ORDER — HYDROMORPHONE HCL 1 MG/ML IJ SOLN
0.2500 mg | INTRAMUSCULAR | Status: DC | PRN
  Administered 2023-12-03 (×2): 0.25 mg via INTRAVENOUS

## 2023-12-03 MED ORDER — PROPOFOL 10 MG/ML IV BOLUS
INTRAVENOUS | Status: DC | PRN
  Administered 2023-12-03: 200 mg via INTRAVENOUS
  Administered 2023-12-03: 50 mg via INTRAVENOUS

## 2023-12-03 MED ORDER — IBUPROFEN 600 MG PO TABS: 600.0000 mg | ORAL_TABLET | Freq: Four times a day (QID) | ORAL | 0 refills | Status: DC

## 2023-12-03 MED ORDER — LIDOCAINE 2% (20 MG/ML) 5 ML SYRINGE
INTRAMUSCULAR | Status: AC
  Filled 2023-12-03: qty 5

## 2023-12-03 MED ORDER — HYDROMORPHONE HCL 1 MG/ML IJ SOLN: 0.2500 mg | INTRAMUSCULAR | Status: DC | PRN

## 2023-12-03 MED ORDER — ORAL CARE MOUTH RINSE: 15.0000 mL | Freq: Once | OROMUCOSAL | Status: AC

## 2023-12-03 MED ORDER — ACETAMINOPHEN 10 MG/ML IV SOLN
1000.0000 mg | Freq: Once | INTRAVENOUS | Status: DC | PRN
  Administered 2023-12-03: 1000 mg via INTRAVENOUS

## 2023-12-03 MED ORDER — LIDOCAINE-EPINEPHRINE 1 %-1:100000 IJ SOLN
INTRAMUSCULAR | Status: DC | PRN
  Administered 2023-12-03: 1 mL

## 2023-12-03 MED ORDER — CHLORHEXIDINE GLUCONATE 0.12 % MT SOLN: 15.0000 mL | Freq: Two times a day (BID) | OROMUCOSAL | 0 refills | Status: AC

## 2023-12-03 MED ORDER — MIDAZOLAM HCL 2 MG/2ML IJ SOLN
INTRAMUSCULAR | Status: AC
Start: 2023-12-03 — End: ?
  Filled 2023-12-03: qty 2

## 2023-12-03 MED ORDER — AMISULPRIDE (ANTIEMETIC) 5 MG/2ML IV SOLN: 10.0000 mg | Freq: Once | INTRAVENOUS | Status: DC | PRN

## 2023-12-03 MED ORDER — SUGAMMADEX SODIUM 200 MG/2ML IV SOLN
INTRAVENOUS | Status: DC | PRN
  Administered 2023-12-03: 300 mg via INTRAVENOUS

## 2023-12-03 MED ORDER — ONDANSETRON HCL 4 MG/2ML IJ SOLN: 4.0000 mg | Freq: Once | INTRAMUSCULAR | Status: DC | PRN

## 2023-12-03 MED ORDER — ONDANSETRON HCL 4 MG/2ML IJ SOLN
INTRAMUSCULAR | Status: DC | PRN
  Administered 2023-12-03: 4 mg via INTRAVENOUS

## 2023-12-03 MED ORDER — METHYLPREDNISOLONE 4 MG PO TBPK: ORAL_TABLET | ORAL | 1 refills | Status: DC

## 2023-12-03 MED ORDER — 0.9 % SODIUM CHLORIDE (POUR BTL) OPTIME
TOPICAL | Status: DC | PRN
  Administered 2023-12-03: 1000 mL

## 2023-12-03 MED ORDER — BACITRACIN ZINC 500 UNIT/GM EX OINT
TOPICAL_OINTMENT | CUTANEOUS | Status: AC
  Filled 2023-12-03: qty 28.35

## 2023-12-03 MED ORDER — DEXAMETHASONE SODIUM PHOSPHATE 10 MG/ML IJ SOLN
INTRAMUSCULAR | Status: AC
  Filled 2023-12-03: qty 1

## 2023-12-03 MED ORDER — ROCURONIUM BROMIDE 10 MG/ML (PF) SYRINGE
PREFILLED_SYRINGE | INTRAVENOUS | Status: DC | PRN
  Administered 2023-12-03: 80 mg via INTRAVENOUS

## 2023-12-03 MED ORDER — LACTATED RINGERS IV SOLN: INTRAVENOUS | Status: DC

## 2023-12-03 MED ORDER — HYDROMORPHONE HCL 1 MG/ML IJ SOLN
INTRAMUSCULAR | Status: AC
  Filled 2023-12-03: qty 1

## 2023-12-03 SURGICAL SUPPLY — 58 items
APPLIER CLIP 9.375 SM OPEN (CLIP) IMPLANT
ATTRACTOMAT 16X20 MAGNETIC DRP (DRAPES) ×1 IMPLANT
BAG COUNTER SPONGE SURGICOUNT (BAG) ×1 IMPLANT
BENZOIN TINCTURE PRP APPL 2/3 (GAUZE/BANDAGES/DRESSINGS) IMPLANT
BLADE SAW GIGLI 16 STRL (MISCELLANEOUS) IMPLANT
BLADE SURG 15 STRL LF DISP TIS (BLADE) IMPLANT
BUR SRG MED 1.6XXCUT FSSR (BURR) IMPLANT
BUR SRG MED 2.1XXCUT FSSR (BURR) IMPLANT
BUR SURG 4X8 MED (BURR) IMPLANT
BURR SRG MED 1.6XXCUT FSSR (BURR) IMPLANT
BURR SRG MED 2.1XXCUT FSSR (BURR) IMPLANT
BURR SURG 4X8 MED (BURR) IMPLANT
CANISTER SUCT 3000ML PPV (MISCELLANEOUS) ×1 IMPLANT
CLEANER TIP ELECTROSURG 2X2 (MISCELLANEOUS) ×1 IMPLANT
CLIP APPLIE 9.375 SM OPEN (CLIP) IMPLANT
CLIP TI MEDIUM 6 (CLIP) IMPLANT
CNTNR URN SCR LID CUP LEK RST (MISCELLANEOUS) IMPLANT
COAGULATOR SUCT SWTCH 10FR 6 (ELECTROSURGICAL) IMPLANT
CORD BIPOLAR FORCEPS 12FT (ELECTRODE) IMPLANT
COVER BACK TABLE 60X90IN (DRAPES) ×1 IMPLANT
COVER SURGICAL LIGHT HANDLE (MISCELLANEOUS) ×1 IMPLANT
DRAPE HALF SHEET 40X57 (DRAPES) IMPLANT
DRSG OPSITE 6X11 MED (GAUZE/BANDAGES/DRESSINGS) IMPLANT
ELECT COATED BLADE 2.86 ST (ELECTRODE) ×1 IMPLANT
ELECT COLORADO STRAIGHT 4 (ELECTROSURGICAL) ×1 IMPLANT
ELECT REM PT RETURN 9FT ADLT (ELECTROSURGICAL) ×1 IMPLANT
ELECTRODE COLORADO STRAIGHT 4 (ELECTROSURGICAL) IMPLANT
ELECTRODE REM PT RTRN 9FT ADLT (ELECTROSURGICAL) ×1 IMPLANT
FORCEPS BIPOLAR SPETZLER 8 1.0 (NEUROSURGERY SUPPLIES) IMPLANT
GAUZE 4X4 16PLY ~~LOC~~+RFID DBL (SPONGE) ×2 IMPLANT
GAUZE PACKING FOLDED 2 STR (GAUZE/BANDAGES/DRESSINGS) IMPLANT
GAUZE XEROFORM 5X9 LF (GAUZE/BANDAGES/DRESSINGS) IMPLANT
GLOVE BIO SURGEON STRL SZ 6 (GLOVE) ×1 IMPLANT
GLOVE BIO SURGEON STRL SZ7.5 (GLOVE) ×1 IMPLANT
GOWN STRL REUS W/ TWL LRG LVL3 (GOWN DISPOSABLE) ×3 IMPLANT
IV CATH AUTO 14GX1.75 SAFE ORG (IV SOLUTION) IMPLANT
KIT BASIN OR (CUSTOM PROCEDURE TRAY) ×1 IMPLANT
KIT TURNOVER KIT B (KITS) ×1 IMPLANT
NDL HYPO 25GX1X1/2 BEV (NEEDLE) IMPLANT
NEEDLE HYPO 25GX1X1/2 BEV (NEEDLE) IMPLANT
NS IRRIG 1000ML POUR BTL (IV SOLUTION) ×2 IMPLANT
PAD ARMBOARD 7.5X6 YLW CONV (MISCELLANEOUS) ×2 IMPLANT
PATTIES SURGICAL .5 X3 (DISPOSABLE) ×1 IMPLANT
PENCIL SMOKE EVACUATOR (MISCELLANEOUS) ×1 IMPLANT
POSITIONER HEAD DONUT 9IN (MISCELLANEOUS) IMPLANT
SPECIMEN JAR SMALL (MISCELLANEOUS) ×1 IMPLANT
SPONGE T-LAP 18X18 ~~LOC~~+RFID (SPONGE) ×1 IMPLANT
STAPLER VISISTAT 35W (STAPLE) ×1 IMPLANT
SUT CHROMIC 4 0 P 3 18 (SUTURE) IMPLANT
SUT ETHILON 4 0 P 3 18 (SUTURE) IMPLANT
SUT MNCRL AB 4-0 PS2 18 (SUTURE) IMPLANT
SUT SILK 3-0 18XBRD TIE 12 (SUTURE) IMPLANT
SUT SILK 4-0 18XBRD TIE 12 (SUTURE) IMPLANT
SUT VIC AB 4-0 P-3 18X BRD (SUTURE) IMPLANT
TOWEL GREEN STERILE FF (TOWEL DISPOSABLE) ×1 IMPLANT
TRAY ENT MC OR (CUSTOM PROCEDURE TRAY) ×1 IMPLANT
TRAY FOLEY MTR SLVR 14FR STAT (SET/KITS/TRAYS/PACK) IMPLANT
WATER STERILE IRR 1000ML POUR (IV SOLUTION) ×2 IMPLANT

## 2023-12-03 NOTE — Anesthesia Postprocedure Evaluation (Signed)
 Anesthesia Post Note  Patient: Hunter Cook  Procedure(s) Performed: SALIVARY STONE REMOVAL/SIALODUCOPLASTY, SALIVARY STENT PLACEMENT (Left)     Patient location during evaluation: PACU Anesthesia Type: General Level of consciousness: awake and alert Pain management: pain level controlled Vital Signs Assessment: post-procedure vital signs reviewed and stable Respiratory status: spontaneous breathing, nonlabored ventilation, respiratory function stable and patient connected to nasal cannula oxygen Cardiovascular status: blood pressure returned to baseline and stable Postop Assessment: no apparent nausea or vomiting Anesthetic complications: no Comments:     No notable events documented.  Last Vitals:  Vitals:   12/03/23 1515 12/03/23 1520  BP:    Pulse:    Resp: 20   Temp:  (!) 36.2 C  SpO2:      Last Pain:  Vitals:   12/03/23 1500  TempSrc:   PainSc: 8                  Garnette DELENA Gab

## 2023-12-03 NOTE — Progress Notes (Signed)
 ENT Progress Note  Patient seen in PACU after surgery appears to be in no acute distress, CN XII intact, stent in place. Post-op instructions reviewed in detail including post-op pain control regimen with Tylenol  and Motrin  staggered 3 hrs apart and taken Q6hrs.   Patient is upset and demanding a script for narcotic pain medication. I explained that his procedure does not require narcotic pain medications. Patient using profanity during conversation, demanding to leave right away AMA. PDMP with above average number of prescriptions in the past for opioids.   Post-op care discussed with his aunt who will take him home today. F/u in the office in 2 weeks.  Elena Larry, MD

## 2023-12-03 NOTE — Discharge Instructions (Signed)
 Salivary Stone Removal Surgery Post-Op Instructions  What to expect afterwards:  You may have pain from the duct or gland that has been operated on. This should improve  over the next 24-48 hours. Please take Tylenol  and Motrin  for pain  To encourage production and drainage of saliva you could suck astringent (sour) sweets or  citrus fruits and massage the gland towards the mouth.  Rinsing your mouth with salty water  can be soothing.  Slight oozing of blood into your mouth may occur.  When to seek help Occasionally, the gland can become infected after the procedure, and to prevent it, you will be given post-operative antibiotic to take as directed. Please contact us  if you experience:  Severe pain or tenderness of the gland.  Pain that is not controlled with painkillers.  Fever.  Redness or hot skin over the gland.  Pus (thick, nasty tasting fluid) discharging from the gland into your mouth.  Please bring this information sheet with you if you seek medical attention. Pain relief If you have the following painkillers at home, take as follows if it is safe for you to do so:  Tylenol  - do not take more than maximum daily dose in 24 hours (unless you have been advised  otherwise e.g. you have a reduced liver function).  Anti-inflammatory - Ibuprofen , Motrin , etc. You can take one of these in addition to  Tylenol  if you have moderate pain (unless you have been advised otherwise e.g. you  have a duodenal ulcer).  As healing occurs, you will feel less pain. Once your pain is controlled and is mild, you should  only take Tylenol . If your pain remains severe for more than three days or is not relieved  by your painkillers, you should contact your surgeon  When can I resume my normal lifestyle? You should be able to resume most normal activities of daily living within 24 hours.  When can I drive? You should not drive for the next 24 hours   You should see Dr Shylynn Bruning in approximately 2  weeks for post-op check. Call the office if you do not have your post-op follow-up scheduled.

## 2023-12-03 NOTE — Interval H&P Note (Signed)
 History and Physical Interval Note:  12/03/2023 12:55 PM  Hunter Cook  has presented today for surgery, with the diagnosis of Chronic sialoadenitis.  The various methods of treatment have been discussed with the patient and family. After consideration of risks, benefits and other options for treatment, the patient has consented to  Procedure(s): SALIVARY STONE REMOVAL/SIALODUCOPLASTY (Left) as a surgical intervention.  The patient's history has been reviewed, patient examined, no change in status, stable for surgery.  I have reviewed the patient's chart and labs.  Questions were answered to the patient's satisfaction.     Edric Fetterman

## 2023-12-03 NOTE — Transfer of Care (Signed)
 Immediate Anesthesia Transfer of Care Note  Patient: Hunter Cook  Procedure(s) Performed: SALIVARY STONE REMOVAL/SIALODUCOPLASTY, SALIVARY STENT PLACEMENT (Left)  Patient Location: PACU  Anesthesia Type:General  Level of Consciousness: awake, alert , and oriented  Airway & Oxygen Therapy: Patient Spontanous Breathing and Patient connected to face mask oxygen  Post-op Assessment: Report given to RN and Post -op Vital signs reviewed and stable  Post vital signs: Reviewed and stable  Last Vitals:  Vitals Value Taken Time  BP 138/92 12/03/23 1449  Temp 36.7 C 12/03/23 1450  Pulse 100 12/03/23 1453  Resp 21 12/03/23 1453  SpO2 91 % 12/03/23 1453  Vitals shown include unfiled device data.  Last Pain:  Vitals:   12/03/23 1450  TempSrc:   PainSc: 8          Complications: No notable events documented.

## 2023-12-03 NOTE — Anesthesia Preprocedure Evaluation (Signed)
 Anesthesia Evaluation  Patient identified by MRN, date of birth, ID band Patient awake    Reviewed: Allergy & Precautions, NPO status , Patient's Chart, lab work & pertinent test results  Airway Mallampati: II  TM Distance: >3 FB Neck ROM: Full    Dental no notable dental hx. (+) Teeth Intact, Dental Advisory Given   Pulmonary Current SmokerPatient did not abstain from smoking.   Pulmonary exam normal breath sounds clear to auscultation       Cardiovascular Exercise Tolerance: Good Normal cardiovascular exam Rhythm:Regular Rate:Normal     Neuro/Psych  PSYCHIATRIC DISORDERS Anxiety Depression Bipolar Disorder      GI/Hepatic negative GI ROS, Neg liver ROS,,,  Endo/Other    Class 3 obesity  Renal/GU negative Renal ROS     Musculoskeletal   Abdominal  (+) + obese  Peds  Hematology Lab Results      Component                Value               Date                      WBC                      9.5                 12/03/2023                HGB                      17.0                12/03/2023                HCT                      50.0                12/03/2023                MCV                      91.7                12/03/2023                PLT                      464 (H)             12/03/2023              Anesthesia Other Findings   Reproductive/Obstetrics                             Anesthesia Physical Anesthesia Plan  ASA: 3  Anesthesia Plan: General   Post-op Pain Management: Tylenol  PO (pre-op)*   Induction: Intravenous  PONV Risk Score and Plan: 2 and Treatment may vary due to age or medical condition, Ondansetron , Midazolam  and Dexamethasone   Airway Management Planned: Video Laryngoscope Planned and Oral ETT  Additional Equipment: None  Intra-op Plan:   Post-operative Plan: Extubation in OR  Informed Consent:      Dental advisory given  Plan Discussed with:  CRNA and Anesthesiologist  Anesthesia Plan Comments:  Anesthesia Quick Evaluation

## 2023-12-03 NOTE — Op Note (Signed)
 Operative Report  PREOPERATIVE DIAGNOSIS: 1. Chronic sialoadenitis, left submandibular gland 2. Salivary gland stone   POSTOPERATIVE DIAGNOSIS: 1. Chronic sialoadenitis, left submandibular gland 2. Salivary gland stone    PROCEDURE 1. Trans-oral approach removal of the left submandibular duct stone  2. Sialodochoplasty  3. Placement of the salivary duct stent  SURGEON: Elena Larry, MD  ASSISTANT: Reyes Cohen, PA-C  ANESTHESIA: General  ESTIMATED BLOOD LOSS: Minimal  COMPLICATIONS: None  DISPOSITION: Stable to PACU  SPECIMENS: None  INTRAOPERATIVE FINDINGS: 1. Left submandibular gland duct stones, largest one approximately 5 mm in size and smaller one approximately 2 mm in size   INDICATIONS AND CONSENT: Hunter Cook who presented to the Otolaryngology clinic with complaints of recurrent pain and swelling of the left submandibular gland and evidence of a large stone on CT neck done in ED. After a thorough evaluation and discussion of treatment options, the patient elected to proceed with the trans-oral stone removal to avoid further infections. After the risks, benefits, and alternatives were discussed, the patient elected to proceed. Risks discussed included but were not limited to bleeding, infection, pain, need for further surgery, recurrence or worsening in symptoms.  DESCRIPTION OF PROCEDURE: On 12/03/23, the patient was identified in the preoperative area, consent confirmed, and transported to the operating suite. They were then transferred to the operating room table and placed in a supine position. After induction of general endotracheal anesthesia, the bed was rotated 90 degrees and a proper surgeon initiated time out was performed. The patient was then prepped and draped in the usual sterile fashion.  The left submandibular duct papilla was identified. We then started the procedure by serially dilating the papilla, which dilated without complication or  difficulty.  Next after identifying the course of the salivary duct we gently palpated along the lacrimal probe to identify the approximate location of the stone.  Once that was determined small amount of local anesthesia with 1% lido with epi 1-100,000 was injected under oral mucosa, and using a combination of Bovie cautery and blunt dissection we created an incision near the location of the salivary duct stones to the point where the lacrimal probe was visualized within the duct.  We then gently dissected using blunt dissection to identify the location of the stones and once the stones were visualized they were extracted using a pickup while gentle pressure was applied externally on the submandibular gland.  We then irrigated the wound, and inspected the area.  Hemostasis was achieved.  Using a combination of 4-0 Vicryl suture and 4-0 nylon suture to secure the salivary duct stent in a created opening to avoid duct stenosis.  Additionally using the same suture material we marsupialized the created opening in the left submandibular salivary duct to avoid stenosis.  Oropharynx was suctioned.  There was evidence of good hemostasis  This concluded our procedure. The patient tolerated the procedure well without any apparent immediate post operative complications. All counts were correct x2. The patient was gently awakened from general anesthesia and taken to the PACU in stable condition.   The advanced practice practitioner (APP) assisted throughout the case. The APP was essential in retraction and counter traction when needed to make the case progress smoothly. This retraction and assistance made it possible to see the tissue planes for the procedure.

## 2023-12-04 ENCOUNTER — Encounter (HOSPITAL_COMMUNITY): Payer: Self-pay | Admitting: Otolaryngology

## 2023-12-09 ENCOUNTER — Encounter (INDEPENDENT_AMBULATORY_CARE_PROVIDER_SITE_OTHER): Payer: Medicare Other | Admitting: Otolaryngology

## 2023-12-16 ENCOUNTER — Encounter (INDEPENDENT_AMBULATORY_CARE_PROVIDER_SITE_OTHER): Payer: Medicare Other | Admitting: Otolaryngology

## 2024-01-28 ENCOUNTER — Telehealth: Admitting: Physician Assistant

## 2024-01-28 DIAGNOSIS — K2900 Acute gastritis without bleeding: Secondary | ICD-10-CM

## 2024-01-28 MED ORDER — FAMOTIDINE 20 MG PO TABS: 20.0000 mg | ORAL_TABLET | Freq: Two times a day (BID) | ORAL | 0 refills | Status: AC

## 2024-01-28 MED ORDER — ONDANSETRON 4 MG PO TBDP
4.0000 mg | ORAL_TABLET | Freq: Three times a day (TID) | ORAL | 0 refills | Status: DC | PRN
Start: 2024-01-28 — End: 2024-04-23

## 2024-01-28 NOTE — Patient Instructions (Addendum)
 [name], thank you for joining [name], PA-C for today's virtual visit.  While this provider is not your primary care provider (PCP), if your PCP is located in our provider database this encounter information will be shared with them immediately following your visit.   A [facility] MyChart account gives you access to today's visit and all your visits, tests, and labs performed at [facility] " click here if you don't have a [facility] MyChart account or go to [email or web address]  Consent: (Patient) Tramell Piechota provided verbal consent for this virtual visit at the beginning of the encounter.  Current Medications:  Current Outpatient Medications:    acetaminophen (TYLENOL) 500 MG tablet, Take 1 tablet (500 mg total) by mouth every 6 (six) hours. Take every 6 hrs x 2-3 days then take every 6 hrs as needed, Disp: 30 tablet, Rfl: 0   cyclobenzaprine (FLEXERIL) 10 MG tablet, Take 1 tablet (10 mg total) by mouth 2 (two) times daily as needed. (Patient not taking: Reported on 11/18/2023), Disp: 20 tablet, Rfl: 0   divalproex (DEPAKOTE) 500 MG DR tablet, Take 1 tablet (500 mg total) by mouth 3 (three) times daily. Take 2 tablets in the morning (1000 mg) and 1 tablet at night (500 mg) (Patient not taking: Reported on 11/18/2023), Disp: 90 tablet, Rfl: 1   ibuprofen (ADVIL) 600 MG tablet, Take 1 tablet (600 mg total) by mouth every 6 (six) hours. Please take every 6 hrs, and stagger this medication 3 hrs apart from Tylenol, Disp: 30 tablet, Rfl: 0   methylPREDNISolone (MEDROL DOSEPAK) 4 MG TBPK tablet, Take with signs of chronic sinusitis and take as directed, Disp: 1 each, Rfl: 1   morphine (MSIR) 15 MG tablet, Take 0.5 tablets (7.5 mg total) by mouth every 4 (four) hours as needed. (Patient not taking: Reported on 11/18/2023), Disp: 5 tablet, Rfl: 0   prazosin (MINIPRESS) 2 MG capsule, Take 1 capsule (2 mg total) by mouth at bedtime. (Patient not taking:  Reported on 11/18/2023), Disp: 30 capsule, Rfl: 1   propranolol (INDERAL) 20 MG tablet, Take 1 tablet (20 mg total) by mouth 2 (two) times daily. (Patient not taking: Reported on 11/18/2023), Disp: 60 tablet, Rfl: 1   risperiDONE (RISPERDAL) 1 MG tablet, Take 1 tablet (1 mg total) by mouth 2 (two) times daily. (Patient not taking: Reported on 11/18/2023), Disp: 60 tablet, Rfl: 1   Medications ordered in this encounter:  No orders of the defined types were placed in this encounter.    *If you need refills on other medications prior to your next appointment, please contact your pharmacy*  Follow-Up: Call back or seek an in-person evaluation if the symptoms worsen or if the condition fails to improve as anticipated.  [facility] Virtual [facility] [phone]  Other Instructions Avoid NSAIDS and alcohol. Start Pepcid twice daily.  Can use OTC Imodium if needed.  Use the Zofran for nausea/vomiting. Start dietary recommendations below once appetite returning.  ER for any worsening symptoms or inability to stay hydrated.   Food Choices to Help Relieve Diarrhea, Adult Diarrhea can make you feel weak and cause you to become dehydrated. Dehydration is a condition in which there is not enough water or other fluids in the body. It is important to choose the right foods and drinks to: Relieve diarrhea. Replace lost fluids and nutrients. Prevent dehydration. What are tips for following this plan? Relieving diarrhea Avoid foods that make your diarrhea worse. These may include: Foods  and drinks that are sweetened with high-fructose corn syrup, honey, or sweeteners such as xylitol, sorbitol, and mannitol. Check food labels for these ingredients. Fried, greasy, or spicy foods. Raw fruits and vegetables. Eat foods that are rich in probiotics. These include foods such as yogurt and fermented milk products. Probiotics can help increase healthy bacteria in your stomach and intestines (gastrointestinal or  GI tract). This may help digestion and stop diarrhea. If you have lactose intolerance, avoid dairy products. These may make your diarrhea worse. Take medicine to help stop diarrhea only as told by your health care provider. Replacing nutrients  Eat bland, easy-to-digest foods in small amounts as you are able, until your diarrhea starts to get better. These foods include bananas, applesauce, rice, toast, and crackers. Over time, add nutrient-rich foods as your body tolerates them or as told by your health care provider. These include: Well-cooked protein foods, such as eggs, lean meats like fish or chicken without skin, and tofu. Peeled, seeded, and soft-cooked fruits and vegetables. Low-fat dairy products. Whole grains. Take vitamin and mineral supplements as told by your health care provider. Preventing dehydration  Start by sipping water or a solution to prevent dehydration (oral rehydration solution, or ORS). This is a drink that helps replace fluids and minerals your body has lost. You can buy an ORS at pharmacies and retail stores. Try to drink at least 8-10 cups (2,000-2,500 mL) of fluid each day to help replace lost fluids. If your urine is pale yellow, you are getting enough fluids. You may drink other liquids in addition to water, such as fruit juice that you have added water to (diluted fruit juice) or low-calorie sports drinks, as tolerated or as told by your health care provider. Avoid drinks with caffeine, such as coffee, tea, or soft drinks. Avoid alcohol. This information is not intended to replace advice given to you by your health care provider. Make sure you discuss any questions you have with your health care provider. Document Revised: 05/01/2022 Document Reviewed: 05/01/2022 Elsevier Patient Education  2024 Elsevier Inc.  If you have been instructed to have an in-person evaluation today at a local Urgent Care facility, please use the link below. It will take you to a list  of all of our available [facility], including address, phone number and hours of operation. Please do not delay care.  [facility]  If you or a family member do not have a primary care provider, use the link below to schedule a visit and establish care. When you choose a [facility] primary care physician or advanced practice provider, you gain a long-term partner in health. Find a Primary Care Provider  Learn more about [facility]'s in-office and virtual care options: [facility] - Get Care Now
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
## 2024-02-29 ENCOUNTER — Emergency Department (HOSPITAL_COMMUNITY)

## 2024-02-29 ENCOUNTER — Inpatient Hospital Stay (HOSPITAL_COMMUNITY)
Admission: EM | Admit: 2024-02-29 | Discharge: 2024-04-21 | DRG: 004 | Disposition: A | Discharge status: Home / Self Care | Attending: Surgery | Admitting: Surgery

## 2024-02-29 ENCOUNTER — Inpatient Hospital Stay (HOSPITAL_COMMUNITY)

## 2024-02-29 ENCOUNTER — Other Ambulatory Visit: Payer: Self-pay

## 2024-02-29 ENCOUNTER — Encounter: Payer: Self-pay | Admitting: Surgery

## 2024-02-29 DIAGNOSIS — E7220 Disorder of urea cycle metabolism, unspecified: Secondary | ICD-10-CM

## 2024-02-29 DIAGNOSIS — F319 Bipolar disorder, unspecified: Secondary | ICD-10-CM | POA: Insufficient documentation

## 2024-02-29 DIAGNOSIS — R451 Restlessness and agitation: Secondary | ICD-10-CM | POA: Insufficient documentation

## 2024-02-29 DIAGNOSIS — T1490XA Injury, unspecified, initial encounter: Principal | ICD-10-CM | POA: Diagnosis present

## 2024-02-29 DIAGNOSIS — F1110 Opioid abuse, uncomplicated: Secondary | ICD-10-CM | POA: Diagnosis present

## 2024-02-29 DIAGNOSIS — G9340 Encephalopathy, unspecified: Secondary | ICD-10-CM | POA: Diagnosis not present

## 2024-02-29 DIAGNOSIS — F10129 Alcohol abuse with intoxication, unspecified: Secondary | ICD-10-CM | POA: Diagnosis present

## 2024-02-29 DIAGNOSIS — N390 Urinary tract infection, site not specified: Secondary | ICD-10-CM | POA: Diagnosis not present

## 2024-02-29 DIAGNOSIS — F10139 Alcohol abuse with withdrawal, unspecified: Secondary | ICD-10-CM | POA: Diagnosis not present

## 2024-02-29 DIAGNOSIS — S0211GA Other fracture of occiput, right side, initial encounter for closed fracture: Secondary | ICD-10-CM | POA: Diagnosis present

## 2024-02-29 DIAGNOSIS — I4891 Unspecified atrial fibrillation: Secondary | ICD-10-CM | POA: Diagnosis present

## 2024-02-29 DIAGNOSIS — S300XXA Contusion of lower back and pelvis, initial encounter: Secondary | ICD-10-CM | POA: Diagnosis present

## 2024-02-29 DIAGNOSIS — S270XXA Traumatic pneumothorax, initial encounter: Secondary | ICD-10-CM | POA: Diagnosis present

## 2024-02-29 DIAGNOSIS — Z781 Physical restraint status: Secondary | ICD-10-CM

## 2024-02-29 DIAGNOSIS — S02652A Fracture of angle of left mandible, initial encounter for closed fracture: Principal | ICD-10-CM | POA: Diagnosis present

## 2024-02-29 DIAGNOSIS — Z6838 Body mass index (BMI) 38.0-38.9, adult: Secondary | ICD-10-CM

## 2024-02-29 DIAGNOSIS — S22069A Unspecified fracture of T7-T8 vertebra, initial encounter for closed fracture: Secondary | ICD-10-CM | POA: Diagnosis present

## 2024-02-29 DIAGNOSIS — E669 Obesity, unspecified: Secondary | ICD-10-CM | POA: Diagnosis present

## 2024-02-29 DIAGNOSIS — J15211 Pneumonia due to Methicillin susceptible Staphylococcus aureus: Secondary | ICD-10-CM | POA: Diagnosis not present

## 2024-02-29 DIAGNOSIS — S22059A Unspecified fracture of T5-T6 vertebra, initial encounter for closed fracture: Secondary | ICD-10-CM | POA: Diagnosis present

## 2024-02-29 DIAGNOSIS — I82411 Acute embolism and thrombosis of right femoral vein: Secondary | ICD-10-CM | POA: Diagnosis present

## 2024-02-29 DIAGNOSIS — F419 Anxiety disorder, unspecified: Secondary | ICD-10-CM | POA: Diagnosis not present

## 2024-02-29 DIAGNOSIS — R4589 Other symptoms and signs involving emotional state: Secondary | ICD-10-CM | POA: Diagnosis not present

## 2024-02-29 DIAGNOSIS — Z87820 Personal history of traumatic brain injury: Secondary | ICD-10-CM

## 2024-02-29 DIAGNOSIS — E870 Hyperosmolality and hypernatremia: Secondary | ICD-10-CM | POA: Diagnosis not present

## 2024-02-29 DIAGNOSIS — Y92410 Unspecified street and highway as the place of occurrence of the external cause: Secondary | ICD-10-CM

## 2024-02-29 DIAGNOSIS — S22049A Unspecified fracture of fourth thoracic vertebra, initial encounter for closed fracture: Secondary | ICD-10-CM | POA: Diagnosis present

## 2024-02-29 DIAGNOSIS — Z23 Encounter for immunization: Secondary | ICD-10-CM

## 2024-02-29 DIAGNOSIS — S86021A Laceration of right Achilles tendon, initial encounter: Secondary | ICD-10-CM | POA: Diagnosis present

## 2024-02-29 DIAGNOSIS — L0591 Pilonidal cyst without abscess: Secondary | ICD-10-CM | POA: Diagnosis present

## 2024-02-29 DIAGNOSIS — Z5320 Procedure and treatment not carried out because of patient's decision for unspecified reasons: Secondary | ICD-10-CM | POA: Diagnosis present

## 2024-02-29 DIAGNOSIS — S27329A Contusion of lung, unspecified, initial encounter: Secondary | ICD-10-CM | POA: Diagnosis not present

## 2024-02-29 DIAGNOSIS — J9602 Acute respiratory failure with hypercapnia: Secondary | ICD-10-CM | POA: Diagnosis not present

## 2024-02-29 DIAGNOSIS — S27322A Contusion of lung, bilateral, initial encounter: Secondary | ICD-10-CM | POA: Diagnosis present

## 2024-02-29 DIAGNOSIS — M79661 Pain in right lower leg: Secondary | ICD-10-CM | POA: Diagnosis not present

## 2024-02-29 DIAGNOSIS — Z653 Problems related to other legal circumstances: Secondary | ICD-10-CM

## 2024-02-29 DIAGNOSIS — S2220XA Unspecified fracture of sternum, initial encounter for closed fracture: Secondary | ICD-10-CM | POA: Diagnosis present

## 2024-02-29 DIAGNOSIS — D75838 Other thrombocytosis: Secondary | ICD-10-CM | POA: Diagnosis not present

## 2024-02-29 DIAGNOSIS — B9561 Methicillin susceptible Staphylococcus aureus infection as the cause of diseases classified elsewhere: Secondary | ICD-10-CM | POA: Diagnosis not present

## 2024-02-29 DIAGNOSIS — F17210 Nicotine dependence, cigarettes, uncomplicated: Secondary | ICD-10-CM | POA: Diagnosis present

## 2024-02-29 DIAGNOSIS — E8770 Fluid overload, unspecified: Secondary | ICD-10-CM | POA: Diagnosis not present

## 2024-02-29 DIAGNOSIS — J80 Acute respiratory distress syndrome: Secondary | ICD-10-CM | POA: Diagnosis present

## 2024-02-29 DIAGNOSIS — I959 Hypotension, unspecified: Secondary | ICD-10-CM | POA: Diagnosis not present

## 2024-02-29 DIAGNOSIS — Y95 Nosocomial condition: Secondary | ICD-10-CM | POA: Diagnosis not present

## 2024-02-29 DIAGNOSIS — J9601 Acute respiratory failure with hypoxia: Secondary | ICD-10-CM | POA: Diagnosis not present

## 2024-02-29 DIAGNOSIS — S2691XA Contusion of heart, unspecified with or without hemopericardium, initial encounter: Secondary | ICD-10-CM | POA: Diagnosis present

## 2024-02-29 DIAGNOSIS — R45851 Suicidal ideations: Secondary | ICD-10-CM | POA: Diagnosis present

## 2024-02-29 DIAGNOSIS — E876 Hypokalemia: Secondary | ICD-10-CM | POA: Diagnosis present

## 2024-02-29 DIAGNOSIS — Z9081 Acquired absence of spleen: Secondary | ICD-10-CM

## 2024-02-29 DIAGNOSIS — S069XAA Unspecified intracranial injury with loss of consciousness status unknown, initial encounter: Secondary | ICD-10-CM | POA: Diagnosis not present

## 2024-02-29 DIAGNOSIS — Z9911 Dependence on respirator [ventilator] status: Secondary | ICD-10-CM | POA: Diagnosis not present

## 2024-02-29 DIAGNOSIS — D72829 Elevated white blood cell count, unspecified: Secondary | ICD-10-CM | POA: Diagnosis not present

## 2024-02-29 DIAGNOSIS — J189 Pneumonia, unspecified organism: Secondary | ICD-10-CM | POA: Diagnosis not present

## 2024-02-29 DIAGNOSIS — S92411A Displaced fracture of proximal phalanx of right great toe, initial encounter for closed fracture: Secondary | ICD-10-CM | POA: Diagnosis present

## 2024-02-29 DIAGNOSIS — F0630 Mood disorder due to known physiological condition, unspecified: Secondary | ICD-10-CM | POA: Diagnosis not present

## 2024-02-29 DIAGNOSIS — Z79899 Other long term (current) drug therapy: Secondary | ICD-10-CM

## 2024-02-29 HISTORY — DX: Anxiety disorder, unspecified: F41.9

## 2024-02-29 HISTORY — DX: Depression, unspecified: F32.A

## 2024-02-29 HISTORY — DX: Bipolar disorder, unspecified: F31.9

## 2024-02-29 LAB — I-STAT CHEM 8, ED
BUN: 15 mg/dL (ref 6–20)
Calcium, Ion: 1.03 mmol/L — ABNORMAL LOW (ref 1.15–1.40)
Chloride: 104 mmol/L (ref 98–111)
Creatinine, Ser: 1.2 mg/dL (ref 0.61–1.24)
Glucose, Bld: 122 mg/dL — ABNORMAL HIGH (ref 70–99)
HCT: 53 % — ABNORMAL HIGH (ref 39.0–52.0)
Hemoglobin: 18 g/dL — ABNORMAL HIGH (ref 13.0–17.0)
Potassium: 4 mmol/L (ref 3.5–5.1)
Sodium: 136 mmol/L (ref 135–145)
TCO2: 21 mmol/L — ABNORMAL LOW (ref 22–32)

## 2024-02-29 LAB — I-STAT ARTERIAL BLOOD GAS, ED
Acid-base deficit: 8 mmol/L — ABNORMAL HIGH (ref 0.0–2.0)
Bicarbonate: 21.3 mmol/L (ref 20.0–28.0)
Calcium, Ion: 1.2 mmol/L (ref 1.15–1.40)
HCT: 50 % (ref 39.0–52.0)
Hemoglobin: 17 g/dL (ref 13.0–17.0)
O2 Saturation: 99 %
Potassium: 3.7 mmol/L (ref 3.5–5.1)
Sodium: 135 mmol/L (ref 135–145)
TCO2: 23 mmol/L (ref 22–32)
pCO2 arterial: 54.3 mmHg — ABNORMAL HIGH (ref 32–48)
pH, Arterial: 7.202 — ABNORMAL LOW (ref 7.35–7.45)
pO2, Arterial: 164 mmHg — ABNORMAL HIGH (ref 83–108)

## 2024-02-29 LAB — URINALYSIS, ROUTINE W REFLEX MICROSCOPIC
Bilirubin Urine: NEGATIVE
Glucose, UA: NEGATIVE mg/dL
Ketones, ur: NEGATIVE mg/dL
Leukocytes,Ua: NEGATIVE
Nitrite: NEGATIVE
Protein, ur: 100 mg/dL — AB
Specific Gravity, Urine: 1.015 (ref 1.005–1.030)
pH: 6 (ref 5.0–8.0)

## 2024-02-29 LAB — SAMPLE TO BLOOD BANK

## 2024-02-29 LAB — CBC
HCT: 51.9 % (ref 39.0–52.0)
Hemoglobin: 17.2 g/dL — ABNORMAL HIGH (ref 13.0–17.0)
MCH: 31.8 pg (ref 26.0–34.0)
MCHC: 33.1 g/dL (ref 30.0–36.0)
MCV: 95.9 fL (ref 80.0–100.0)
Platelets: 365 10*3/uL (ref 150–400)
RBC: 5.41 MIL/uL (ref 4.22–5.81)
RDW: 14.3 % (ref 11.5–15.5)
WBC: 23.7 10*3/uL — ABNORMAL HIGH (ref 4.0–10.5)
nRBC: 0 % (ref 0.0–0.2)

## 2024-02-29 LAB — I-STAT CG4 LACTIC ACID, ED: Lactic Acid, Venous: 3.7 mmol/L (ref 0.5–1.9)

## 2024-02-29 LAB — COMPREHENSIVE METABOLIC PANEL WITH GFR
ALT: 102 U/L — ABNORMAL HIGH (ref 0–44)
AST: 147 U/L — ABNORMAL HIGH (ref 15–41)
Albumin: 4 g/dL (ref 3.5–5.0)
Alkaline Phosphatase: 63 U/L (ref 38–126)
Anion gap: 17 — ABNORMAL HIGH (ref 5–15)
BUN: 13 mg/dL (ref 6–20)
CO2: 16 mmol/L — ABNORMAL LOW (ref 22–32)
Calcium: 8.8 mg/dL — ABNORMAL LOW (ref 8.9–10.3)
Chloride: 101 mmol/L (ref 98–111)
Creatinine, Ser: 1.1 mg/dL (ref 0.61–1.24)
GFR, Estimated: >60 mL/min (ref >60)
Glucose, Bld: 126 mg/dL — ABNORMAL HIGH (ref 70–99)
Potassium: 4.2 mmol/L (ref 3.5–5.1)
Sodium: 134 mmol/L — ABNORMAL LOW (ref 135–145)
Total Bilirubin: 0.9 mg/dL (ref 0.0–1.2)
Total Protein: 7 g/dL (ref 6.5–8.1)

## 2024-02-29 LAB — ETHANOL: Alcohol, Ethyl (B): 190 mg/dL — ABNORMAL HIGH (ref <10)

## 2024-02-29 LAB — PROTIME-INR
INR: 1.1 (ref 0.8–1.2)
Prothrombin Time: 14.3 s (ref 11.4–15.2)

## 2024-02-29 MED ORDER — DILTIAZEM HCL-DEXTROSE 125-5 MG/125ML-% IV SOLN (PREMIX)
5.0000 mg/h | INTRAVENOUS | Status: DC
  Administered 2024-02-29 – 2024-03-02 (×4): 5 mg/h via INTRAVENOUS
  Filled 2024-02-29 (×4): qty 125

## 2024-02-29 MED ORDER — IOHEXOL 350 MG/ML SOLN
75.0000 mL | Freq: Once | INTRAVENOUS | Status: AC | PRN
  Administered 2024-02-29: 75 mL via INTRAVENOUS

## 2024-02-29 MED ORDER — METOPROLOL TARTRATE 5 MG/5ML IV SOLN: 5.0000 mg | Freq: Four times a day (QID) | INTRAVENOUS | Status: DC | PRN

## 2024-02-29 MED ORDER — FENTANYL CITRATE PF 50 MCG/ML IJ SOSY: 50.0000 ug | PREFILLED_SYRINGE | Freq: Once | INTRAMUSCULAR | Status: DC

## 2024-02-29 MED ORDER — MIDAZOLAM HCL 2 MG/2ML IJ SOLN
1.0000 mg | INTRAMUSCULAR | Status: DC | PRN
  Administered 2024-02-29 – 2024-03-06 (×26): 2 mg via INTRAVENOUS
  Filled 2024-02-29 (×26): qty 2

## 2024-02-29 MED ORDER — METHOCARBAMOL 500 MG PO TABS
500.0000 mg | ORAL_TABLET | Freq: Three times a day (TID) | ORAL | Status: AC
  Administered 2024-02-29 – 2024-03-03 (×7): 500 mg
  Filled 2024-02-29 (×9): qty 1

## 2024-02-29 MED ORDER — FENTANYL CITRATE PF 50 MCG/ML IJ SOSY
50.0000 ug | PREFILLED_SYRINGE | Freq: Once | INTRAMUSCULAR | Status: DC
  Filled 2024-02-29: qty 1

## 2024-02-29 MED ORDER — DOCUSATE SODIUM 50 MG/5ML PO LIQD
100.0000 mg | Freq: Two times a day (BID) | ORAL | Status: DC
  Administered 2024-02-29 – 2024-03-12 (×24): 100 mg
  Filled 2024-02-29 (×24): qty 10

## 2024-02-29 MED ORDER — FENTANYL 2500MCG IN NS 250ML (10MCG/ML) PREMIX INFUSION: 50.0000 ug/h | INTRAVENOUS | Status: DC

## 2024-02-29 MED ORDER — METHOCARBAMOL 1000 MG/10ML IJ SOLN
500.0000 mg | Freq: Three times a day (TID) | INTRAMUSCULAR | Status: AC
  Administered 2024-03-01 – 2024-03-03 (×2): 500 mg via INTRAVENOUS
  Filled 2024-02-29 (×2): qty 10

## 2024-02-29 MED ORDER — LACTATED RINGERS IV BOLUS
1000.0000 mL | Freq: Once | INTRAVENOUS | Status: AC
  Administered 2024-02-29: 1000 mL via INTRAVENOUS

## 2024-02-29 MED ORDER — PROPOFOL 1000 MG/100ML IV EMUL
INTRAVENOUS | Status: AC
  Filled 2024-02-29: qty 100

## 2024-02-29 MED ORDER — SODIUM CHLORIDE 0.9 % IV SOLN: INTRAVENOUS | Status: AC

## 2024-02-29 MED ORDER — ROCURONIUM BROMIDE 10 MG/ML (PF) SYRINGE
PREFILLED_SYRINGE | INTRAVENOUS | Status: AC | PRN
  Administered 2024-02-29: 100 mg via INTRAVENOUS

## 2024-02-29 MED ORDER — DILTIAZEM LOAD VIA INFUSION
10.0000 mg | Freq: Once | INTRAVENOUS | Status: AC
  Administered 2024-02-29: 10 mg via INTRAVENOUS
  Filled 2024-02-29: qty 10

## 2024-02-29 MED ORDER — ENOXAPARIN SODIUM 30 MG/0.3ML IJ SOSY
30.0000 mg | PREFILLED_SYRINGE | Freq: Two times a day (BID) | INTRAMUSCULAR | Status: DC
  Administered 2024-03-02: 30 mg via SUBCUTANEOUS
  Filled 2024-02-29: qty 0.3

## 2024-02-29 MED ORDER — FENTANYL BOLUS VIA INFUSION
50.0000 ug | INTRAVENOUS | Status: DC | PRN
  Administered 2024-03-01 – 2024-03-02 (×3): 100 ug via INTRAVENOUS
  Administered 2024-03-02: 50 ug via INTRAVENOUS
  Administered 2024-03-04 – 2024-03-06 (×5): 100 ug via INTRAVENOUS
  Administered 2024-03-06: 50 ug via INTRAVENOUS
  Administered 2024-03-06: 100 ug via INTRAVENOUS

## 2024-02-29 MED ORDER — ONDANSETRON 4 MG PO TBDP
4.0000 mg | ORAL_TABLET | Freq: Four times a day (QID) | ORAL | Status: DC | PRN
  Filled 2024-02-29: qty 1

## 2024-02-29 MED ORDER — FENTANYL 2500MCG IN NS 250ML (10MCG/ML) PREMIX INFUSION
0.0000 ug/h | INTRAVENOUS | Status: DC
  Administered 2024-02-29: 150 ug/h via INTRAVENOUS
  Administered 2024-03-01 – 2024-03-02 (×5): 200 ug/h via INTRAVENOUS
  Administered 2024-03-03 (×2): 250 ug/h via INTRAVENOUS
  Administered 2024-03-04 – 2024-03-05 (×6): 300 ug/h via INTRAVENOUS
  Administered 2024-03-05 (×2): 350 ug/h via INTRAVENOUS
  Administered 2024-03-06: 400 ug/h via INTRAVENOUS
  Administered 2024-03-06: 375 ug/h via INTRAVENOUS
  Administered 2024-03-06 (×2): 400 ug/h via INTRAVENOUS
  Administered 2024-03-07: 375 ug/h via INTRAVENOUS
  Filled 2024-02-29 (×22): qty 250

## 2024-02-29 MED ORDER — PROPOFOL 1000 MG/100ML IV EMUL
0.0000 ug/kg/min | INTRAVENOUS | Status: DC
  Administered 2024-02-29 – 2024-03-01 (×2): 50 ug/kg/min via INTRAVENOUS
  Administered 2024-03-01: 15 ug/kg/min via INTRAVENOUS
  Administered 2024-03-01: 25 ug/kg/min via INTRAVENOUS
  Administered 2024-03-01: 10 ug/kg/min via INTRAVENOUS
  Administered 2024-03-01: 50 ug/kg/min via INTRAVENOUS
  Administered 2024-03-02: 30 ug/kg/min via INTRAVENOUS
  Administered 2024-03-02: 25 ug/kg/min via INTRAVENOUS
  Administered 2024-03-02 (×2): 30 ug/kg/min via INTRAVENOUS
  Administered 2024-03-02: 25 ug/kg/min via INTRAVENOUS
  Administered 2024-03-03 – 2024-03-05 (×16): 30 ug/kg/min via INTRAVENOUS
  Administered 2024-03-06 – 2024-03-07 (×10): 40 ug/kg/min via INTRAVENOUS
  Filled 2024-02-29 (×36): qty 100

## 2024-02-29 MED ORDER — TETANUS-DIPHTH-ACELL PERTUSSIS 5-2.5-18.5 LF-MCG/0.5 IM SUSY
0.5000 mL | PREFILLED_SYRINGE | Freq: Once | INTRAMUSCULAR | Status: AC
  Administered 2024-02-29: 0.5 mL via INTRAMUSCULAR

## 2024-02-29 MED ORDER — ONDANSETRON HCL 4 MG/2ML IJ SOLN
4.0000 mg | Freq: Four times a day (QID) | INTRAMUSCULAR | Status: DC | PRN
  Administered 2024-03-14: 4 mg via INTRAVENOUS
  Filled 2024-02-29: qty 2

## 2024-02-29 MED ORDER — ACETAMINOPHEN 500 MG PO TABS
1000.0000 mg | ORAL_TABLET | Freq: Four times a day (QID) | ORAL | Status: DC
  Administered 2024-02-29 – 2024-04-07 (×142): 1000 mg
  Filled 2024-02-29 (×149): qty 2

## 2024-02-29 MED ORDER — CHLORHEXIDINE GLUCONATE CLOTH 2 % EX PADS
6.0000 | MEDICATED_PAD | Freq: Every day | CUTANEOUS | Status: DC
  Administered 2024-03-02 – 2024-04-08 (×45): 6 via TOPICAL

## 2024-02-29 MED ORDER — DOCUSATE SODIUM 100 MG PO CAPS: 100.0000 mg | ORAL_CAPSULE | Freq: Two times a day (BID) | ORAL | Status: DC

## 2024-02-29 MED ORDER — ETOMIDATE 2 MG/ML IV SOLN
INTRAVENOUS | Status: AC | PRN
Start: 2024-02-29 — End: 2024-02-29
  Administered 2024-02-29: 20 mg via INTRAVENOUS
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
## 2024-02-29 NOTE — Progress Notes (Addendum)
   02/29/24 2208  Spiritual Encounters  Type of Visit Initial  Care provided to: Patient  Conversation partners present during encounter Nurse  Referral source Trauma page  Reason for visit Trauma  OnCall Visit Yes   Chaplain attended Trauma 2 page.  Pt not available to speak with chaplain as medical team provided care.  Pt was then intubated and sedated.  No support persons present.  No family on the way that we know of.  No further spiritual care services required  Chaplain services remain available by Spiritual Consult or for emergent cases, paging [phone]  Chaplain Dewitte Foreman, MDiv [email or web address] [phone]

## 2024-02-29 NOTE — ED Notes (Signed)
 7.5 ET tube. Positive color change.
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
## 2024-02-29 NOTE — Progress Notes (Signed)
 Orthopedic Tech Progress Note Patient Details:  Hunter Cook 11/26/1877 [id number]  Patient ID: Hunter Cook, male   DOB: 11/26/1877, 28 y.o.   MRN: [id number] I attended trauma page. Terryann Fiddler 02/29/2024, 8:34 PM
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
## 2024-02-29 NOTE — ED Notes (Signed)
 Patient transported to CT
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
## 2024-03-01 ENCOUNTER — Encounter (HOSPITAL_COMMUNITY): Payer: Self-pay

## 2024-03-01 ENCOUNTER — Inpatient Hospital Stay (HOSPITAL_COMMUNITY)

## 2024-03-01 DIAGNOSIS — I4891 Unspecified atrial fibrillation: Secondary | ICD-10-CM

## 2024-03-01 DIAGNOSIS — S02652A Fracture of angle of left mandible, initial encounter for closed fracture: Secondary | ICD-10-CM

## 2024-03-01 DIAGNOSIS — I480 Paroxysmal atrial fibrillation: Secondary | ICD-10-CM

## 2024-03-01 DIAGNOSIS — T1490XA Injury, unspecified, initial encounter: Secondary | ICD-10-CM | POA: Diagnosis not present

## 2024-03-01 LAB — CBC
HCT: 46.2 % (ref 39.0–52.0)
HCT: 47 % (ref 39.0–52.0)
Hemoglobin: 15.8 g/dL (ref 13.0–17.0)
Hemoglobin: 15.6 g/dL (ref 13.0–17.0)
MCH: 32.2 pg (ref 26.0–34.0)
MCH: 32 pg (ref 26.0–34.0)
MCHC: 34.2 g/dL (ref 30.0–36.0)
MCHC: 33.2 g/dL (ref 30.0–36.0)
MCV: 94.1 fL (ref 80.0–100.0)
MCV: 96.3 fL (ref 80.0–100.0)
Platelets: 275 10*3/uL (ref 150–400)
Platelets: 234 10*3/uL (ref 150–400)
RBC: 4.91 MIL/uL (ref 4.22–5.81)
RBC: 4.88 MIL/uL (ref 4.22–5.81)
RDW: 14.4 % (ref 11.5–15.5)
RDW: 14.4 % (ref 11.5–15.5)
WBC: 18.6 10*3/uL — ABNORMAL HIGH (ref 4.0–10.5)
WBC: 18.2 10*3/uL — ABNORMAL HIGH (ref 4.0–10.5)
nRBC: 0 % (ref 0.0–0.2)
nRBC: 0 % (ref 0.0–0.2)

## 2024-03-01 LAB — BASIC METABOLIC PANEL WITH GFR
Anion gap: 12 (ref 5–15)
Anion gap: 10 (ref 5–15)
BUN: 14 mg/dL (ref 6–20)
BUN: 16 mg/dL (ref 6–20)
CO2: 20 mmol/L — ABNORMAL LOW (ref 22–32)
CO2: 22 mmol/L (ref 22–32)
Calcium: 8.1 mg/dL — ABNORMAL LOW (ref 8.9–10.3)
Calcium: 8.2 mg/dL — ABNORMAL LOW (ref 8.9–10.3)
Chloride: 103 mmol/L (ref 98–111)
Chloride: 102 mmol/L (ref 98–111)
Creatinine, Ser: 1.05 mg/dL (ref 0.61–1.24)
Creatinine, Ser: 1.49 mg/dL — ABNORMAL HIGH (ref 0.61–1.24)
GFR, Estimated: >60 mL/min (ref >60)
GFR, Estimated: >60 mL/min (ref >60)
Glucose, Bld: 117 mg/dL — ABNORMAL HIGH (ref 70–99)
Glucose, Bld: 144 mg/dL — ABNORMAL HIGH (ref 70–99)
Potassium: 4.6 mmol/L (ref 3.5–5.1)
Potassium: 6.3 mmol/L (ref 3.5–5.1)
Sodium: 135 mmol/L (ref 135–145)
Sodium: 134 mmol/L — ABNORMAL LOW (ref 135–145)

## 2024-03-01 LAB — ECHOCARDIOGRAM COMPLETE
S' Lateral: 3.2 cm
Weight: 4480 [oz_av]

## 2024-03-01 LAB — HIV ANTIBODY (ROUTINE TESTING W REFLEX): HIV Screen 4th Generation wRfx: NONREACTIVE

## 2024-03-01 LAB — TROPONIN I (HIGH SENSITIVITY)
Troponin I (High Sensitivity): 45 ng/L — ABNORMAL HIGH (ref <18)
Troponin I (High Sensitivity): 44 ng/L — ABNORMAL HIGH (ref <18)
Troponin I (High Sensitivity): 44 ng/L — ABNORMAL HIGH (ref <18)
Troponin I (High Sensitivity): 50 ng/L — ABNORMAL HIGH (ref <18)

## 2024-03-01 LAB — MAGNESIUM
Magnesium: 2 mg/dL (ref 1.7–2.4)
Magnesium: 2.1 mg/dL (ref 1.7–2.4)

## 2024-03-01 LAB — PHOSPHORUS
Phosphorus: 3.7 mg/dL (ref 2.5–4.6)
Phosphorus: 4.4 mg/dL (ref 2.5–4.6)

## 2024-03-01 LAB — MRSA NEXT GEN BY PCR, NASAL: MRSA by PCR Next Gen: NOT DETECTED

## 2024-03-01 LAB — TRIGLYCERIDES: Triglycerides: 377 mg/dL — ABNORMAL HIGH (ref <150)

## 2024-03-01 MED ORDER — VITAL HIGH PROTEIN PO LIQD: 1000.0000 mL | ORAL | Status: DC

## 2024-03-01 MED ORDER — SODIUM CHLORIDE 0.9 % IV SOLN: 250.0000 mL | INTRAVENOUS | Status: AC

## 2024-03-01 MED ORDER — CALCIUM GLUCONATE-NACL 2-0.675 GM/100ML-% IV SOLN
2.0000 g | Freq: Once | INTRAVENOUS | Status: AC
  Administered 2024-03-01: 2000 mg via INTRAVENOUS
  Filled 2024-03-01: qty 100

## 2024-03-01 MED ORDER — ROCURONIUM BROMIDE 10 MG/ML (PF) SYRINGE
100.0000 mg | PREFILLED_SYRINGE | Freq: Once | INTRAVENOUS | Status: AC | PRN
  Administered 2024-03-01: 100 mg via INTRAVENOUS
  Filled 2024-03-01: qty 10

## 2024-03-01 MED ORDER — PIVOT 1.5 CAL PO LIQD
1000.0000 mL | ORAL | Status: DC
  Administered 2024-03-01 – 2024-03-13 (×16): 1000 mL

## 2024-03-01 MED ORDER — DEXMEDETOMIDINE HCL IN NACL 400 MCG/100ML IV SOLN
0.0000 ug/kg/h | INTRAVENOUS | Status: DC
  Administered 2024-03-01: 0.6 ug/kg/h via INTRAVENOUS
  Administered 2024-03-01: 0.5 ug/kg/h via INTRAVENOUS
  Administered 2024-03-01 (×2): 0.4 ug/kg/h via INTRAVENOUS
  Administered 2024-03-02: 0.8 ug/kg/h via INTRAVENOUS
  Administered 2024-03-02: 0.7 ug/kg/h via INTRAVENOUS
  Administered 2024-03-02: 1.2 ug/kg/h via INTRAVENOUS
  Administered 2024-03-02: 0.7 ug/kg/h via INTRAVENOUS
  Administered 2024-03-02: 1.2 ug/kg/h via INTRAVENOUS
  Administered 2024-03-02: 1 ug/kg/h via INTRAVENOUS
  Administered 2024-03-02 – 2024-03-07 (×39): 1.2 ug/kg/h via INTRAVENOUS
  Filled 2024-03-01 (×25): qty 100
  Filled 2024-03-01: qty 200
  Filled 2024-03-01 (×18): qty 100

## 2024-03-01 MED ORDER — NOREPINEPHRINE 4 MG/250ML-% IV SOLN
2.0000 ug/min | INTRAVENOUS | Status: DC
  Administered 2024-03-01: 7 ug/min via INTRAVENOUS
  Administered 2024-03-02: 3 ug/min via INTRAVENOUS
  Filled 2024-03-01 (×2): qty 250

## 2024-03-01 MED ORDER — SODIUM CHLORIDE 0.9 % IV SOLN: INTRAVENOUS | Status: DC

## 2024-03-01 MED ORDER — SODIUM ZIRCONIUM CYCLOSILICATE 10 G PO PACK
10.0000 g | PACK | Freq: Once | ORAL | Status: AC
  Administered 2024-03-01: 10 g
  Filled 2024-03-01: qty 1

## 2024-03-01 MED ORDER — ORAL CARE MOUTH RINSE
15.0000 mL | OROMUCOSAL | Status: DC
  Administered 2024-03-01 – 2024-03-29 (×327): 15 mL via OROMUCOSAL

## 2024-03-01 MED ORDER — PROSOURCE TF20 ENFIT COMPATIBL EN LIQD
60.0000 mL | Freq: Every day | ENTERAL | Status: DC
  Administered 2024-03-01: 60 mL
  Filled 2024-03-01: qty 60

## 2024-03-01 MED ORDER — CALCIUM GLUCONATE-NACL 1-0.675 GM/50ML-% IV SOLN
1.0000 g | Freq: Once | INTRAVENOUS | Status: AC
  Administered 2024-03-01: 1000 mg via INTRAVENOUS
  Filled 2024-03-01: qty 50

## 2024-03-01 MED ORDER — THIAMINE MONONITRATE 100 MG PO TABS
100.0000 mg | ORAL_TABLET | Freq: Every day | ORAL | Status: AC
  Administered 2024-03-01 – 2024-03-07 (×7): 100 mg
  Filled 2024-03-01 (×7): qty 1

## 2024-03-01 MED ORDER — PIVOT 1.5 CAL PO LIQD: 1000.0000 mL | ORAL | Status: DC

## 2024-03-01 MED ORDER — ORAL CARE MOUTH RINSE: 15.0000 mL | OROMUCOSAL | Status: DC | PRN

## 2024-03-01 MED ORDER — NOREPINEPHRINE 4 MG/250ML-% IV SOLN
INTRAVENOUS | Status: AC
  Administered 2024-03-01: 30 ug/min via INTRAVENOUS
  Filled 2024-03-01: qty 250

## 2024-03-01 MED ORDER — SODIUM ZIRCONIUM CYCLOSILICATE 10 G PO PACK: 10.0000 g | PACK | Freq: Once | ORAL | Status: DC

## 2024-03-01 MED ORDER — PERFLUTREN LIPID MICROSPHERE
1.0000 mL | INTRAVENOUS | Status: AC | PRN
  Administered 2024-03-01: 3 mL via INTRAVENOUS

## 2024-03-01 MED ORDER — SODIUM CHLORIDE 0.9 % IV SOLN: INTRAVENOUS | Status: AC | PRN

## 2024-03-01 MED ORDER — OXYCODONE HCL 5 MG PO TABS
5.0000 mg | ORAL_TABLET | ORAL | Status: DC | PRN
  Filled 2024-03-01: qty 2
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
## 2024-03-01 NOTE — Procedures (Signed)
 Arterial Line Insertion Start/End4/04/2024 1:00 PM, 03/01/2024 1:05 PM  Patient location: ICU. Preanesthetic checklist: patient identified, monitors and equipment checked and timeout performed Left, radial was placed Catheter size: 20 G Hand hygiene performed  and maximum sterile barriers used  Allen's test indicative of satisfactory collateral circulation Attempts: 2 Procedure performed without using ultrasound guided technique. Following insertion, dressing applied and Biopatch. Post procedure assessment: unchanged and normal  Patient tolerated the procedure well with no immediate complications. Additional procedure comments: RT x2 obtained aline per Dr.Lovick at bedside, no complications .

## 2024-03-01 NOTE — Progress Notes (Signed)
 This RN took patient down to MRI with RT and student nurse accompanying. Vital signs all were within normal limits at the time of departure and arrival of the MRI bay. Before administration of versed, patient was normotensive with SBP 115. 2mg  of Versed was administered for MRI, along with sedation medications continuously infusing at lowest and safest dose. BP reading in MRI was 72 SBP, and this RN asked to pause the scan and recheck pressures in a different location, to which the pressure then was 60/40 with a HR in the 80's. Sedation was stopped immediately. RN found palpable pulses throughout the event.  This RN called charge nurse, Orman Blacksmith, and informed her of the situation, and immediately came to MRI to which levophed was imitated on the way back to 4N to maintain hemodynamic stability.  Dr. Aniceto Barley was paged by Laveta Pottier and this RN informed her of the situation, sedation remaining off and still being hypotensive. The decision to place a femoral line and arterial line was made, and placed by Kaelyn and Phillip (RRT), and femoral line placed by Hassell Linsey.   Metha Kolasa, RN

## 2024-03-01 NOTE — Progress Notes (Signed)
 Abrupt hemodynamically decompensation in MRI. BP 60s/30s refractory to levophed. Levo up to 30 before BP response. All sedating meds off. CVC and art line placed. BP rebounded and levo able to be turned off. Labs sent. Suspect related to cardiac ctxn. Cont to monitor.  Critical Care time  Hassell Linsey, MD
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
## 2024-03-01 NOTE — Progress Notes (Signed)
 MRI C and T spine reviewed. C spine shows slight widening of C1 articulation with some ligamentous strain as well. T spine shows T6, T7, T10, T11, T12, and L1 superior endplate fractures. Aspen collar for C spine and TLSO brace when out of bed for T spine.

## 2024-03-01 NOTE — Progress Notes (Signed)
 Orthopedic Tech Progress Note Patient Details:  Hunter Cook 09-12-1996 [id number]  Ortho Devices Type of Ortho Device: Postop shoe/boot Ortho Device/Splint Location: RLE Ortho Device/Splint Interventions: Ordered, Application, Adjustment   Post Interventions Patient Tolerated: Well  [name] 03/01/2024, 3:04 PM

## 2024-03-01 NOTE — Progress Notes (Signed)
 Patient transfer from ED without incident. Right leg immobilizer in place. Stat MRI- called down at 11pm and 4:30am and they were occupied with other patients.  Patient did abdominal crunch and pulled out OT tube. H eis following commands well but requiring q 1 hr Versed in addition to max _Propofol and fentanyl gtt. Tried to maintain TLSO precautions
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
## 2024-03-01 NOTE — Progress Notes (Signed)
 Transition of Care [facility]) - CAGE-AID Screening   Patient Details  Name: Hunter Cook MRN: [id number] Date of Birth: 04/10/1996  [facility] CM/SW Contact:    [name], RN Phone Number: 03/01/2024, 6:58 AM   Clinical Narrative:  Patient unable to participate in screening, currently intubated.  CAGE-AID Screening: Substance Abuse Screening unable to be completed due to: : Patient unable to participate (Patient currently intubated)
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
## 2024-03-01 NOTE — Progress Notes (Signed)
 RT transported patient from [id number] to MRI and back with RN. Refer to RN note. RT will continue to monitor.

## 2024-03-01 NOTE — Plan of Care (Signed)
 Progressing toward goals. Still acute first 24 hours.

## 2024-03-01 NOTE — Progress Notes (Signed)
  Echocardiogram 2D Echocardiogram has been performed.  Farley Honer 03/01/2024, 10:48 AM

## 2024-03-01 NOTE — Procedures (Signed)
   Procedure Note  Date: 03/01/2024  Procedure: central venous catheter placement--right, femoral vein, with ultrasound guidance  Pre-op diagnosis: hypotension, need for administration of vasopressors Post-op diagnosis: same  Surgeon: Anda Bamberg, MD  Anesthesia: local  EBL: <5cc Drains/Implants: triple lumen central venous catheter  Description of procedure: This procedure was performed emergently and therefore informed consent was not obtained. The right groin was prepped and draped in the usual sterile fashion. The right femoral vein was localized with ultrasound guidance. Five ccs of local anesthetic was infiltrated at the site of venous access. The right femoral vein was accessed using an introducer needle and a guidewire passed through the needle. The needle was removed and a skin nick was made. The tract was dilated and the central venous catheter advanced over the guidewire followed by removal of the guidewire. All ports drew blood easily and all were flushed with saline. The catheter was secured to the skin with suture and a sterile dressing. The patient tolerated the procedure well. There were no immediate complications.   Anda Bamberg, MD General and Trauma Surgery [facility]
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
## 2024-03-02 DIAGNOSIS — R7989 Other specified abnormal findings of blood chemistry: Secondary | ICD-10-CM

## 2024-03-02 LAB — MAGNESIUM
Magnesium: 2.2 mg/dL (ref 1.7–2.4)
Magnesium: 2.3 mg/dL (ref 1.7–2.4)

## 2024-03-02 LAB — BASIC METABOLIC PANEL WITH GFR
Anion gap: 9 (ref 5–15)
BUN: 17 mg/dL (ref 6–20)
CO2: 23 mmol/L (ref 22–32)
Calcium: 8.1 mg/dL — ABNORMAL LOW (ref 8.9–10.3)
Chloride: 103 mmol/L (ref 98–111)
Creatinine, Ser: 0.97 mg/dL (ref 0.61–1.24)
GFR, Estimated: >60 mL/min (ref >60)
Glucose, Bld: 156 mg/dL — ABNORMAL HIGH (ref 70–99)
Potassium: 4.3 mmol/L (ref 3.5–5.1)
Sodium: 135 mmol/L (ref 135–145)

## 2024-03-02 LAB — GLUCOSE, CAPILLARY
Glucose-Capillary: 143 mg/dL — ABNORMAL HIGH (ref 70–99)
Glucose-Capillary: 144 mg/dL — ABNORMAL HIGH (ref 70–99)
Glucose-Capillary: 173 mg/dL — ABNORMAL HIGH (ref 70–99)

## 2024-03-02 LAB — CBC
HCT: 38.6 % — ABNORMAL LOW (ref 39.0–52.0)
Hemoglobin: 12.9 g/dL — ABNORMAL LOW (ref 13.0–17.0)
MCH: 32.2 pg (ref 26.0–34.0)
MCHC: 33.4 g/dL (ref 30.0–36.0)
MCV: 96.3 fL (ref 80.0–100.0)
Platelets: 166 10*3/uL (ref 150–400)
RBC: 4.01 MIL/uL — ABNORMAL LOW (ref 4.22–5.81)
RDW: 14.1 % (ref 11.5–15.5)
WBC: 14.7 10*3/uL — ABNORMAL HIGH (ref 4.0–10.5)
nRBC: 0 % (ref 0.0–0.2)

## 2024-03-02 LAB — PHOSPHORUS
Phosphorus: 2.2 mg/dL — ABNORMAL LOW (ref 2.5–4.6)
Phosphorus: 2.3 mg/dL — ABNORMAL LOW (ref 2.5–4.6)

## 2024-03-02 MED ORDER — OXYCODONE HCL 5 MG PO TABS
5.0000 mg | ORAL_TABLET | ORAL | Status: DC | PRN
  Administered 2024-03-02 – 2024-03-10 (×16): 10 mg
  Administered 2024-03-10: 5 mg
  Administered 2024-03-11 (×4): 10 mg
  Administered 2024-03-12: 5 mg
  Administered 2024-03-12 – 2024-03-16 (×6): 10 mg
  Filled 2024-03-02 (×28): qty 2

## 2024-03-02 MED ORDER — SODIUM CHLORIDE 0.9 % IV BOLUS
1000.0000 mL | Freq: Once | INTRAVENOUS | Status: AC
  Administered 2024-03-02: 1000 mL via INTRAVENOUS

## 2024-03-02 MED ORDER — SODIUM PHOSPHATES 45 MMOLE/15ML IV SOLN
15.0000 mmol | Freq: Once | INTRAVENOUS | Status: AC
  Administered 2024-03-02: 15 mmol via INTRAVENOUS
  Filled 2024-03-02: qty 5

## 2024-03-02 MED ORDER — GUAIFENESIN 100 MG/5ML PO LIQD
15.0000 mL | ORAL | Status: DC
  Administered 2024-03-02 – 2024-04-07 (×212): 15 mL
  Filled 2024-03-02 (×15): qty 15
  Filled 2024-03-02: qty 20
  Filled 2024-03-02: qty 15
  Filled 2024-03-02: qty 20
  Filled 2024-03-02: qty 15
  Filled 2024-03-02: qty 20
  Filled 2024-03-02 (×2): qty 15
  Filled 2024-03-02 (×2): qty 20
  Filled 2024-03-02: qty 15
  Filled 2024-03-02: qty 20
  Filled 2024-03-02: qty 15
  Filled 2024-03-02 (×2): qty 20
  Filled 2024-03-02 (×3): qty 15
  Filled 2024-03-02: qty 20
  Filled 2024-03-02 (×4): qty 15
  Filled 2024-03-02 (×2): qty 20
  Filled 2024-03-02 (×10): qty 15
  Filled 2024-03-02: qty 20
  Filled 2024-03-02 (×4): qty 15
  Filled 2024-03-02: qty 20
  Filled 2024-03-02 (×3): qty 15
  Filled 2024-03-02: qty 20
  Filled 2024-03-02: qty 15
  Filled 2024-03-02: qty 20
  Filled 2024-03-02 (×2): qty 15
  Filled 2024-03-02: qty 20
  Filled 2024-03-02: qty 15
  Filled 2024-03-02: qty 20
  Filled 2024-03-02: qty 15
  Filled 2024-03-02: qty 20
  Filled 2024-03-02: qty 15
  Filled 2024-03-02: qty 20
  Filled 2024-03-02 (×4): qty 15
  Filled 2024-03-02: qty 20
  Filled 2024-03-02 (×3): qty 15
  Filled 2024-03-02 (×6): qty 20
  Filled 2024-03-02: qty 15
  Filled 2024-03-02: qty 20
  Filled 2024-03-02 (×2): qty 15
  Filled 2024-03-02: qty 20
  Filled 2024-03-02: qty 15
  Filled 2024-03-02: qty 20
  Filled 2024-03-02 (×3): qty 15
  Filled 2024-03-02: qty 20
  Filled 2024-03-02 (×2): qty 15
  Filled 2024-03-02 (×2): qty 20
  Filled 2024-03-02: qty 15
  Filled 2024-03-02 (×2): qty 20
  Filled 2024-03-02 (×6): qty 15
  Filled 2024-03-02: qty 20
  Filled 2024-03-02 (×5): qty 15
  Filled 2024-03-02: qty 20
  Filled 2024-03-02: qty 15
  Filled 2024-03-02 (×5): qty 20
  Filled 2024-03-02 (×2): qty 15
  Filled 2024-03-02: qty 20
  Filled 2024-03-02: qty 15
  Filled 2024-03-02: qty 20
  Filled 2024-03-02: qty 15
  Filled 2024-03-02 (×2): qty 20
  Filled 2024-03-02 (×4): qty 15
  Filled 2024-03-02 (×3): qty 20
  Filled 2024-03-02 (×2): qty 15
  Filled 2024-03-02: qty 20
  Filled 2024-03-02: qty 15
  Filled 2024-03-02: qty 20
  Filled 2024-03-02: qty 15
  Filled 2024-03-02: qty 20
  Filled 2024-03-02 (×2): qty 15
  Filled 2024-03-02: qty 20
  Filled 2024-03-02 (×3): qty 15
  Filled 2024-03-02: qty 20
  Filled 2024-03-02: qty 15
  Filled 2024-03-02 (×4): qty 20
  Filled 2024-03-02: qty 15
  Filled 2024-03-02 (×5): qty 20
  Filled 2024-03-02 (×2): qty 15
  Filled 2024-03-02 (×3): qty 20
  Filled 2024-03-02 (×8): qty 15
  Filled 2024-03-02 (×3): qty 20
  Filled 2024-03-02 (×4): qty 15
  Filled 2024-03-02: qty 20
  Filled 2024-03-02: qty 15
  Filled 2024-03-02: qty 20
  Filled 2024-03-02 (×7): qty 15
  Filled 2024-03-02: qty 20
  Filled 2024-03-02: qty 15
  Filled 2024-03-02 (×3): qty 20
  Filled 2024-03-02: qty 15
  Filled 2024-03-02: qty 20
  Filled 2024-03-02: qty 15
  Filled 2024-03-02: qty 20
  Filled 2024-03-02 (×2): qty 15
  Filled 2024-03-02 (×3): qty 20
  Filled 2024-03-02: qty 15
  Filled 2024-03-02 (×2): qty 20
  Filled 2024-03-02 (×5): qty 15
  Filled 2024-03-02: qty 20

## 2024-03-02 MED ORDER — POLYETHYLENE GLYCOL 3350 17 G PO PACK
17.0000 g | PACK | Freq: Every day | ORAL | Status: DC
  Administered 2024-03-02 – 2024-03-04 (×3): 17 g
  Filled 2024-03-02 (×3): qty 1

## 2024-03-02 MED ORDER — ENOXAPARIN SODIUM 40 MG/0.4ML IJ SOSY
40.0000 mg | PREFILLED_SYRINGE | Freq: Two times a day (BID) | INTRAMUSCULAR | Status: DC
  Administered 2024-03-02 – 2024-04-07 (×71): 40 mg via SUBCUTANEOUS
  Filled 2024-03-02 (×71): qty 0.4

## 2024-03-02 NOTE — Procedures (Signed)
 Cortrak  Person Inserting Tube:  Kendell Bane C, RD Tube Type:  Cortrak - 43 inches Tube Size:  10 Tube Location:  Right nare Initial Placement:  Stomach Secured by: Bridle Technique Used to Measure Tube Placement:  Marking at nare/corner of mouth Cortrak Secured At:  68 cm   Cortrak Tube Team Note:  Consult received to place a Cortrak feeding tube.   No x-ray is required. RN may begin using tube.   If the tube becomes dislodged please keep the tube and contact the Cortrak team at [email or web address] for replacement.  If after hours and replacement cannot be delayed, place a NG tube and confirm placement with an abdominal x-ray.    Cammy Copa., RD, LDN, CNSC See AMiON for contact information
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
## 2024-03-02 NOTE — Progress Notes (Signed)
 Hypoxic episode prior to extubation, so plan aborted. Guaifenisen and resp cx ordered.   Anda Bamberg, MD General and Trauma Surgery [facility]
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
## 2024-03-02 NOTE — Progress Notes (Signed)
 NEUROSURGERY PROGRESS NOTE  No acute events overnight. Fractures will just require bracing for right now once he is able to get out of bed. Continue supportive care.   Temp:  [100.6 F (38.1 C)-101 F (38.3 C)] 100.6 F (38.1 C) (04/07 0751) Pulse Rate:  [79-116] 81 (04/07 1025) Resp:  [0-29] 15 (04/07 1025) BP: (58-122)/(41-87) 100/51 (04/07 1000) SpO2:  [91 %-99 %] 91 % (04/07 1025) Arterial Line BP: (84-185)/(46-109) 107/59 (04/07 0900) FiO2 (%):  [50 %-60 %] 50 % (04/07 1025) Weight:  [119.4 kg] 119.4 kg (04/07 0500)   Jeannette Mills, NP 03/02/2024 10:33 AM

## 2024-03-03 ENCOUNTER — Inpatient Hospital Stay (HOSPITAL_COMMUNITY)

## 2024-03-03 LAB — URINALYSIS, ROUTINE W REFLEX MICROSCOPIC
Bilirubin Urine: NEGATIVE
Glucose, UA: NEGATIVE mg/dL
Hgb urine dipstick: NEGATIVE
Ketones, ur: NEGATIVE mg/dL
Leukocytes,Ua: NEGATIVE
Nitrite: NEGATIVE
Protein, ur: 30 mg/dL — AB
Specific Gravity, Urine: 1.029 (ref 1.005–1.030)
pH: 5 (ref 5.0–8.0)

## 2024-03-03 LAB — COMPREHENSIVE METABOLIC PANEL WITH GFR
ALT: 63 U/L — ABNORMAL HIGH (ref 0–44)
AST: 95 U/L — ABNORMAL HIGH (ref 15–41)
Albumin: 2.3 g/dL — ABNORMAL LOW (ref 3.5–5.0)
Alkaline Phosphatase: 40 U/L (ref 38–126)
Anion gap: 10 (ref 5–15)
BUN: 20 mg/dL (ref 6–20)
CO2: 24 mmol/L (ref 22–32)
Calcium: 8.1 mg/dL — ABNORMAL LOW (ref 8.9–10.3)
Chloride: 105 mmol/L (ref 98–111)
Creatinine, Ser: 0.85 mg/dL (ref 0.61–1.24)
GFR, Estimated: >60 mL/min (ref >60)
Glucose, Bld: 129 mg/dL — ABNORMAL HIGH (ref 70–99)
Potassium: 4.2 mmol/L (ref 3.5–5.1)
Sodium: 139 mmol/L (ref 135–145)
Total Bilirubin: 0.7 mg/dL (ref 0.0–1.2)
Total Protein: 5.6 g/dL — ABNORMAL LOW (ref 6.5–8.1)

## 2024-03-03 LAB — CBC
HCT: 35.1 % — ABNORMAL LOW (ref 39.0–52.0)
Hemoglobin: 11.5 g/dL — ABNORMAL LOW (ref 13.0–17.0)
MCH: 32.1 pg (ref 26.0–34.0)
MCHC: 32.8 g/dL (ref 30.0–36.0)
MCV: 98 fL (ref 80.0–100.0)
Platelets: 183 10*3/uL (ref 150–400)
RBC: 3.58 MIL/uL — ABNORMAL LOW (ref 4.22–5.81)
RDW: 14.5 % (ref 11.5–15.5)
WBC: 13.9 10*3/uL — ABNORMAL HIGH (ref 4.0–10.5)
nRBC: 0.2 % (ref 0.0–0.2)

## 2024-03-03 LAB — HEMOGLOBIN A1C
Hgb A1c MFr Bld: 5.4 % (ref 4.8–5.6)
Mean Plasma Glucose: 108.28 mg/dL

## 2024-03-03 LAB — GLUCOSE, CAPILLARY
Glucose-Capillary: 130 mg/dL — ABNORMAL HIGH (ref 70–99)
Glucose-Capillary: 171 mg/dL — ABNORMAL HIGH (ref 70–99)
Glucose-Capillary: 137 mg/dL — ABNORMAL HIGH (ref 70–99)
Glucose-Capillary: 143 mg/dL — ABNORMAL HIGH (ref 70–99)
Glucose-Capillary: 129 mg/dL — ABNORMAL HIGH (ref 70–99)
Glucose-Capillary: 137 mg/dL — ABNORMAL HIGH (ref 70–99)

## 2024-03-03 LAB — TRIGLYCERIDES: Triglycerides: 343 mg/dL — ABNORMAL HIGH (ref <150)

## 2024-03-03 MED ORDER — MAGNESIUM HYDROXIDE 400 MG/5ML PO SUSP
30.0000 mL | Freq: Once | ORAL | Status: AC
  Administered 2024-03-03: 30 mL via ORAL
  Filled 2024-03-03: qty 30

## 2024-03-03 MED ORDER — IBUPROFEN 200 MG PO TABS
600.0000 mg | ORAL_TABLET | Freq: Four times a day (QID) | ORAL | Status: DC | PRN
  Administered 2024-03-05 (×3): 600 mg via ORAL
  Filled 2024-03-03 (×5): qty 3

## 2024-03-03 MED ORDER — SODIUM PHOSPHATES 45 MMOLE/15ML IV SOLN
15.0000 mmol | Freq: Once | INTRAVENOUS | Status: AC
  Administered 2024-03-03: 15 mmol via INTRAVENOUS
  Filled 2024-03-03: qty 5

## 2024-03-03 MED ORDER — BISACODYL 10 MG RE SUPP
10.0000 mg | Freq: Once | RECTAL | Status: AC
  Administered 2024-03-03: 10 mg via RECTAL
  Filled 2024-03-03: qty 1

## 2024-03-03 MED ORDER — FUROSEMIDE 10 MG/ML IJ SOLN
40.0000 mg | Freq: Once | INTRAMUSCULAR | Status: AC
  Administered 2024-03-03: 40 mg via INTRAVENOUS
  Filled 2024-03-03: qty 4

## 2024-03-03 MED ORDER — SENNA 8.6 MG PO TABS
2.0000 | ORAL_TABLET | Freq: Once | ORAL | Status: AC
  Administered 2024-03-03: 17.2 mg via ORAL
  Filled 2024-03-03: qty 2

## 2024-03-03 MED ORDER — AMPICILLIN-SULBACTAM SODIUM 3 (2-1) G IJ SOLR
3.0000 g | Freq: Four times a day (QID) | INTRAMUSCULAR | Status: DC
  Administered 2024-03-03 – 2024-03-07 (×16): 3 g via INTRAVENOUS
  Filled 2024-03-03 (×16): qty 8

## 2024-03-03 MED ORDER — INSULIN ASPART 100 UNIT/ML IJ SOLN
0.0000 [IU] | INTRAMUSCULAR | Status: DC
  Administered 2024-03-03 – 2024-03-07 (×17): 1 [IU] via SUBCUTANEOUS
  Administered 2024-03-07: 2 [IU] via SUBCUTANEOUS
  Administered 2024-03-08 (×3): 1 [IU] via SUBCUTANEOUS
  Administered 2024-03-08 – 2024-03-09 (×2): 2 [IU] via SUBCUTANEOUS
  Administered 2024-03-09 (×2): 1 [IU] via SUBCUTANEOUS
  Administered 2024-03-09 – 2024-03-10 (×8): 2 [IU] via SUBCUTANEOUS
  Administered 2024-03-10: 1 [IU] via SUBCUTANEOUS
  Administered 2024-03-11: 2 [IU] via SUBCUTANEOUS
  Administered 2024-03-11: 3 [IU] via SUBCUTANEOUS
  Administered 2024-03-11 (×2): 2 [IU] via SUBCUTANEOUS
  Administered 2024-03-11: 3 [IU] via SUBCUTANEOUS
  Administered 2024-03-11 (×2): 2 [IU] via SUBCUTANEOUS
  Administered 2024-03-12 (×4): 1 [IU] via SUBCUTANEOUS
  Administered 2024-03-13: 2 [IU] via SUBCUTANEOUS
  Administered 2024-03-13 – 2024-03-31 (×41): 1 [IU] via SUBCUTANEOUS
  Administered 2024-04-01: 2 [IU] via SUBCUTANEOUS
  Administered 2024-04-02 – 2024-04-11 (×15): 1 [IU] via SUBCUTANEOUS

## 2024-03-03 MED ORDER — DILTIAZEM HCL 60 MG PO TABS
60.0000 mg | ORAL_TABLET | Freq: Three times a day (TID) | ORAL | Status: DC
  Administered 2024-03-03 – 2024-04-07 (×105): 60 mg
  Filled 2024-03-03 (×110): qty 1

## 2024-03-03 MED ORDER — BISACODYL 5 MG PO TBEC
10.0000 mg | DELAYED_RELEASE_TABLET | Freq: Once | ORAL | Status: AC
Start: 2024-03-03 — End: 2024-03-03
  Administered 2024-03-03: 10 mg via ORAL
  Filled 2024-03-03: qty 2

## 2024-03-03 NOTE — Progress Notes (Signed)
 Orthopedic Tech Progress Note Patient Details:  Hunter Cook [birth year] [id number]  Ortho Devices Type of Ortho Device: Thoracolumbar corset (TLSO) Ortho Device/Splint Location: BACK Ortho Device/Splint Interventions: Ordered, Other (comment), Adjustment   Post Interventions Patient Tolerated: Well Instructions Provided: Care of device  [name] 03/03/2024, 11:19 AM
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
## 2024-03-03 NOTE — Progress Notes (Signed)
 RT arrived bedside and noted patient desating, increased WOB and increased RR. RT suctioned copious amounts of tan/yellow thick secretions. Patient continued to have sat issues, increased WOB and increased RR on CPAP/PSV even after suctioning, increasing PS and FiO2.  RT placed patient back on PRVC. RN aware and at bedside during this time. RT will continue to monitor.

## 2024-03-04 ENCOUNTER — Other Ambulatory Visit: Payer: Self-pay

## 2024-03-04 LAB — CULTURE, RESPIRATORY W GRAM STAIN

## 2024-03-04 LAB — GLUCOSE, CAPILLARY
Glucose-Capillary: 100 mg/dL — ABNORMAL HIGH (ref 70–99)
Glucose-Capillary: 131 mg/dL — ABNORMAL HIGH (ref 70–99)
Glucose-Capillary: 148 mg/dL — ABNORMAL HIGH (ref 70–99)
Glucose-Capillary: 139 mg/dL — ABNORMAL HIGH (ref 70–99)
Glucose-Capillary: 112 mg/dL — ABNORMAL HIGH (ref 70–99)
Glucose-Capillary: 111 mg/dL — ABNORMAL HIGH (ref 70–99)

## 2024-03-04 LAB — TRIGLYCERIDES: Triglycerides: 326 mg/dL — ABNORMAL HIGH (ref <150)

## 2024-03-04 LAB — PHOSPHORUS: Phosphorus: 2.7 mg/dL (ref 2.5–4.6)

## 2024-03-04 LAB — BASIC METABOLIC PANEL WITH GFR
Anion gap: 9 (ref 5–15)
BUN: 17 mg/dL (ref 6–20)
CO2: 24 mmol/L (ref 22–32)
Calcium: 7.9 mg/dL — ABNORMAL LOW (ref 8.9–10.3)
Chloride: 107 mmol/L (ref 98–111)
Creatinine, Ser: 0.82 mg/dL (ref 0.61–1.24)
GFR, Estimated: >60 mL/min (ref >60)
Glucose, Bld: 123 mg/dL — ABNORMAL HIGH (ref 70–99)
Potassium: 4.1 mmol/L (ref 3.5–5.1)
Sodium: 140 mmol/L (ref 135–145)

## 2024-03-04 LAB — MAGNESIUM: Magnesium: 2.3 mg/dL (ref 1.7–2.4)

## 2024-03-04 MED ORDER — QUETIAPINE FUMARATE 25 MG PO TABS
50.0000 mg | ORAL_TABLET | Freq: Two times a day (BID) | ORAL | Status: DC
  Administered 2024-03-04 – 2024-03-08 (×10): 50 mg
  Filled 2024-03-04 (×10): qty 2

## 2024-03-04 MED ORDER — SODIUM CHLORIDE 0.9% FLUSH
10.0000 mL | Freq: Two times a day (BID) | INTRAVENOUS | Status: DC
  Administered 2024-03-04 – 2024-03-16 (×21): 10 mL
  Administered 2024-03-17: 30 mL
  Administered 2024-03-17 – 2024-03-19 (×4): 10 mL
  Administered 2024-03-19 – 2024-03-20 (×3): 20 mL
  Administered 2024-03-21: 10 mL
  Administered 2024-03-21 – 2024-03-22 (×2): 20 mL
  Administered 2024-03-22: 10 mL
  Administered 2024-03-23: 30 mL
  Administered 2024-03-23: 20 mL
  Administered 2024-03-24: 10 mL
  Administered 2024-03-24: 30 mL
  Administered 2024-03-25 – 2024-03-28 (×8): 10 mL
  Administered 2024-03-29: 20 mL
  Administered 2024-03-29: 10 mL
  Administered 2024-03-30: 30 mL
  Administered 2024-03-30 – 2024-03-31 (×2): 10 mL
  Administered 2024-03-31: 30 mL
  Administered 2024-04-01 – 2024-04-02 (×2): 10 mL
  Administered 2024-04-03: 20 mL
  Administered 2024-04-03: 10 mL
  Administered 2024-04-04: 30 mL
  Administered 2024-04-04: 10 mL
  Administered 2024-04-05: 30 mL
  Administered 2024-04-05 – 2024-04-21 (×24): 10 mL

## 2024-03-04 MED ORDER — VALPROIC ACID 250 MG PO CAPS: 500.0000 mg | ORAL_CAPSULE | Freq: Two times a day (BID) | ORAL | Status: DC

## 2024-03-04 MED ORDER — MAGNESIUM CITRATE PO SOLN
1.0000 | Freq: Once | ORAL | Status: AC
  Administered 2024-03-04: 1
  Filled 2024-03-04: qty 296

## 2024-03-04 MED ORDER — BISACODYL 5 MG PO TBEC: 10.0000 mg | DELAYED_RELEASE_TABLET | Freq: Once | ORAL | Status: DC

## 2024-03-04 MED ORDER — MAGNESIUM HYDROXIDE 400 MG/5ML PO SUSP
30.0000 mL | Freq: Once | ORAL | Status: AC
  Administered 2024-03-04: 30 mL
  Filled 2024-03-04: qty 30

## 2024-03-04 MED ORDER — SENNA 8.6 MG PO TABS
2.0000 | ORAL_TABLET | Freq: Once | ORAL | Status: AC
  Administered 2024-03-04: 17.2 mg
  Filled 2024-03-04: qty 2

## 2024-03-04 MED ORDER — BISACODYL 10 MG RE SUPP
10.0000 mg | Freq: Once | RECTAL | Status: AC
  Administered 2024-03-04: 10 mg via RECTAL
  Filled 2024-03-04: qty 1

## 2024-03-04 MED ORDER — FUROSEMIDE 10 MG/ML IJ SOLN
40.0000 mg | Freq: Once | INTRAMUSCULAR | Status: AC
  Administered 2024-03-04: 40 mg via INTRAVENOUS
  Filled 2024-03-04: qty 4

## 2024-03-04 MED ORDER — VALPROIC ACID 250 MG/5ML PO SOLN
500.0000 mg | Freq: Two times a day (BID) | ORAL | Status: DC
  Administered 2024-03-04 – 2024-03-09 (×12): 500 mg
  Filled 2024-03-04 (×15): qty 10

## 2024-03-04 MED ORDER — PANTOPRAZOLE SODIUM 40 MG IV SOLR
40.0000 mg | Freq: Every day | INTRAVENOUS | Status: DC
  Administered 2024-03-04 – 2024-03-23 (×20): 40 mg via INTRAVENOUS
  Filled 2024-03-04 (×20): qty 10

## 2024-03-04 MED ORDER — SODIUM CHLORIDE 0.9% FLUSH: 10.0000 mL | INTRAVENOUS | Status: DC | PRN

## 2024-03-04 MED ORDER — POLYETHYLENE GLYCOL 3350 17 G PO PACK
17.0000 g | PACK | Freq: Two times a day (BID) | ORAL | Status: DC
  Administered 2024-03-04 – 2024-03-05 (×3): 17 g
  Filled 2024-03-04 (×3): qty 1

## 2024-03-04 NOTE — Progress Notes (Signed)
 Attempted to call family for PICC consent but no answer and just going to the voicemail. RN aware.

## 2024-03-04 NOTE — Progress Notes (Signed)
 Peripherally Inserted Central Catheter Placement  The IV Nurse has discussed with the patient and/or persons authorized to consent for the patient, the purpose of this procedure and the potential benefits and risks involved with this procedure.  The benefits include less needle sticks, lab draws from the catheter, and the patient may be discharged home with the catheter. Risks include, but not limited to, infection, bleeding, blood clot (thrombus formation), and puncture of an artery; nerve damage and irregular heartbeat and possibility to perform a PICC exchange if needed/ordered by physician.  Alternatives to this procedure were also discussed.  Bard Power PICC patient education guide, fact sheet on infection prevention and patient information card has been provided to patient /or left at bedside.    PICC Placement Documentation  PICC Triple Lumen 03/04/24 Right Cephalic 40 cm 0 cm (Active)  Indication for Insertion or Continuance of Line Limited venous access - need for IV therapy >5 days (PICC only);Prolonged intravenous therapies 03/04/24 1917  Exposed Catheter (cm) 0 cm 03/04/24 1917  Site Assessment Clean, Dry, Intact 03/04/24 1917  Lumen #1 Status Flushed;Saline locked;Blood return noted 03/04/24 1917  Lumen #2 Status Flushed;Saline locked;Blood return noted 03/04/24 1917  Lumen #3 Status Flushed;Saline locked;Blood return noted 03/04/24 1917  Dressing Type Transparent;Securing device 03/04/24 1917  Dressing Status Antimicrobial disc/dressing in place;Clean, Dry, Intact 03/04/24 1917  Line Care Connections checked and tightened 03/04/24 1917  Line Adjustment (NICU/IV Team Only) No 03/04/24 1917  Dressing Intervention New dressing;Adhesive placed at insertion site (IV team only);Other (Comment) 03/04/24 1917  Dressing Change Due 03/11/24 03/04/24 1917    Patient's spouse, Vinessa Slezak, signed PICC consent via telephone. Verified by 2 PICC RNs.   Eleora Sutherland 03/04/2024, 7:19 PM
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
## 2024-03-05 ENCOUNTER — Inpatient Hospital Stay (HOSPITAL_COMMUNITY)

## 2024-03-05 LAB — GLUCOSE, CAPILLARY
Glucose-Capillary: 106 mg/dL — ABNORMAL HIGH (ref 70–99)
Glucose-Capillary: 115 mg/dL — ABNORMAL HIGH (ref 70–99)
Glucose-Capillary: 121 mg/dL — ABNORMAL HIGH (ref 70–99)
Glucose-Capillary: 134 mg/dL — ABNORMAL HIGH (ref 70–99)
Glucose-Capillary: 141 mg/dL — ABNORMAL HIGH (ref 70–99)
Glucose-Capillary: 145 mg/dL — ABNORMAL HIGH (ref 70–99)

## 2024-03-05 MED ORDER — MAGNESIUM CITRATE PO SOLN
1.0000 | Freq: Once | ORAL | Status: AC
  Administered 2024-03-05: 1
  Filled 2024-03-05: qty 296

## 2024-03-05 MED ORDER — FUROSEMIDE 10 MG/ML IJ SOLN
40.0000 mg | Freq: Four times a day (QID) | INTRAMUSCULAR | Status: AC
  Administered 2024-03-05 (×2): 40 mg via INTRAVENOUS
  Filled 2024-03-05 (×2): qty 4

## 2024-03-05 MED ORDER — IPRATROPIUM-ALBUTEROL 0.5-2.5 (3) MG/3ML IN SOLN
3.0000 mL | Freq: Four times a day (QID) | RESPIRATORY_TRACT | Status: DC | PRN
  Administered 2024-03-06 – 2024-04-20 (×5): 3 mL via RESPIRATORY_TRACT
  Filled 2024-03-05 (×7): qty 3

## 2024-03-05 MED ORDER — BISACODYL 5 MG PO TBEC
10.0000 mg | DELAYED_RELEASE_TABLET | Freq: Once | ORAL | Status: AC
  Administered 2024-03-05: 10 mg via ORAL
  Filled 2024-03-05: qty 2

## 2024-03-05 MED ORDER — METHYLNALTREXONE BROMIDE 12 MG/0.6ML ~~LOC~~ SOLN
12.0000 mg | Freq: Once | SUBCUTANEOUS | Status: AC
  Administered 2024-03-05: 12 mg via SUBCUTANEOUS
  Filled 2024-03-05: qty 0.6

## 2024-03-05 MED ORDER — FUROSEMIDE 10 MG/ML IJ SOLN
40.0000 mg | Freq: Once | INTRAMUSCULAR | Status: AC
  Administered 2024-03-06: 40 mg via INTRAVENOUS
  Filled 2024-03-05: qty 4

## 2024-03-05 MED ORDER — SENNA 8.6 MG PO TABS
2.0000 | ORAL_TABLET | Freq: Once | ORAL | Status: AC
  Administered 2024-03-05: 17.2 mg
  Filled 2024-03-05: qty 2

## 2024-03-05 MED ORDER — POLYETHYLENE GLYCOL 3350 17 G PO PACK: 17.0000 g | PACK | Freq: Every day | ORAL | Status: DC

## 2024-03-05 MED ORDER — SMOG ENEMA
960.0000 mL | Freq: Once | RECTAL | Status: AC
  Administered 2024-03-05: 960 mL via RECTAL
  Filled 2024-03-05: qty 960

## 2024-03-05 MED ORDER — MAGNESIUM HYDROXIDE 400 MG/5ML PO SUSP
30.0000 mL | Freq: Once | ORAL | Status: AC
  Administered 2024-03-05: 30 mL
  Filled 2024-03-05: qty 30

## 2024-03-05 MED ORDER — BISACODYL 10 MG RE SUPP: 10.0000 mg | Freq: Once | RECTAL | Status: DC

## 2024-03-05 NOTE — Progress Notes (Signed)
 MRI of right ankle shows 25% achilles tendon tear. Discussed patient with Dr. Cherl Corner. Recommend CAM boot right ankle with 2 heel lifts at all times. Wound care to do daily dressings for laceration. Patient can follow up outpatient with Dr. Cherl Corner.
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
## 2024-03-05 NOTE — Progress Notes (Signed)
 Orthopedic Tech Progress Note Patient Details:  Hunter Cook 10-Jan-1996 [id number]  Ortho Devices Type of Ortho Device: CAM walker Ortho Device/Splint Location: RLE Ortho Device/Splint Interventions: Application, Adjustment, Ordered  CAM boot applied with heel floating Post Interventions Patient Tolerated: Well Instructions Provided: Care of device  Hunter Cook 03/05/2024, 1:15 PM
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
## 2024-03-06 ENCOUNTER — Inpatient Hospital Stay (HOSPITAL_COMMUNITY)

## 2024-03-06 DIAGNOSIS — S02652A Fracture of angle of left mandible, initial encounter for closed fracture: Secondary | ICD-10-CM | POA: Diagnosis not present

## 2024-03-06 LAB — GLUCOSE, CAPILLARY
Glucose-Capillary: 116 mg/dL — ABNORMAL HIGH (ref 70–99)
Glucose-Capillary: 129 mg/dL — ABNORMAL HIGH (ref 70–99)
Glucose-Capillary: 142 mg/dL — ABNORMAL HIGH (ref 70–99)
Glucose-Capillary: 124 mg/dL — ABNORMAL HIGH (ref 70–99)
Glucose-Capillary: 111 mg/dL — ABNORMAL HIGH (ref 70–99)
Glucose-Capillary: 115 mg/dL — ABNORMAL HIGH (ref 70–99)

## 2024-03-06 LAB — POCT I-STAT 7, (LYTES, BLD GAS, ICA,H+H)
Acid-Base Excess: 5 mmol/L — ABNORMAL HIGH (ref 0.0–2.0)
Acid-Base Excess: 7 mmol/L — ABNORMAL HIGH (ref 0.0–2.0)
Acid-Base Excess: 8 mmol/L — ABNORMAL HIGH (ref 0.0–2.0)
Bicarbonate: 31.8 mmol/L — ABNORMAL HIGH (ref 20.0–28.0)
Bicarbonate: 33.3 mmol/L — ABNORMAL HIGH (ref 20.0–28.0)
Bicarbonate: 35 mmol/L — ABNORMAL HIGH (ref 20.0–28.0)
Calcium, Ion: 1.11 mmol/L — ABNORMAL LOW (ref 1.15–1.40)
Calcium, Ion: 1.13 mmol/L — ABNORMAL LOW (ref 1.15–1.40)
Calcium, Ion: 1.19 mmol/L (ref 1.15–1.40)
HCT: 30 % — ABNORMAL LOW (ref 39.0–52.0)
HCT: 30 % — ABNORMAL LOW (ref 39.0–52.0)
HCT: 31 % — ABNORMAL LOW (ref 39.0–52.0)
Hemoglobin: 10.2 g/dL — ABNORMAL LOW (ref 13.0–17.0)
Hemoglobin: 10.2 g/dL — ABNORMAL LOW (ref 13.0–17.0)
Hemoglobin: 10.5 g/dL — ABNORMAL LOW (ref 13.0–17.0)
O2 Saturation: 84 %
O2 Saturation: 92 %
O2 Saturation: 96 %
Patient temperature: 100.5
Patient temperature: 102.2
Patient temperature: 99.5
Potassium: 3.6 mmol/L (ref 3.5–5.1)
Potassium: 3.7 mmol/L (ref 3.5–5.1)
Potassium: 4 mmol/L (ref 3.5–5.1)
Sodium: 147 mmol/L — ABNORMAL HIGH (ref 135–145)
Sodium: 149 mmol/L — ABNORMAL HIGH (ref 135–145)
Sodium: 149 mmol/L — ABNORMAL HIGH (ref 135–145)
TCO2: 33 mmol/L — ABNORMAL HIGH (ref 22–32)
TCO2: 35 mmol/L — ABNORMAL HIGH (ref 22–32)
TCO2: 37 mmol/L — ABNORMAL HIGH (ref 22–32)
pCO2 arterial: 59.4 mmHg — ABNORMAL HIGH (ref 32–48)
pCO2 arterial: 60.9 mmHg — ABNORMAL HIGH (ref 32–48)
pCO2 arterial: 61.5 mmHg — ABNORMAL HIGH (ref 32–48)
pH, Arterial: 7.33 — ABNORMAL LOW (ref 7.35–7.45)
pH, Arterial: 7.362 (ref 7.35–7.45)
pH, Arterial: 7.37 (ref 7.35–7.45)
pO2, Arterial: 55 mmHg — ABNORMAL LOW (ref 83–108)
pO2, Arterial: 77 mmHg — ABNORMAL LOW (ref 83–108)
pO2, Arterial: 88 mmHg (ref 83–108)

## 2024-03-06 LAB — BASIC METABOLIC PANEL WITH GFR
Anion gap: 9 (ref 5–15)
Anion gap: 13 (ref 5–15)
BUN: 26 mg/dL — ABNORMAL HIGH (ref 6–20)
BUN: 22 mg/dL — ABNORMAL HIGH (ref 6–20)
CO2: 29 mmol/L (ref 22–32)
CO2: 28 mmol/L (ref 22–32)
Calcium: 7.9 mg/dL — ABNORMAL LOW (ref 8.9–10.3)
Calcium: 6.5 mg/dL — ABNORMAL LOW (ref 8.9–10.3)
Chloride: 111 mmol/L (ref 98–111)
Chloride: 110 mmol/L (ref 98–111)
Creatinine, Ser: 1.22 mg/dL (ref 0.61–1.24)
Creatinine, Ser: 1.42 mg/dL — ABNORMAL HIGH (ref 0.61–1.24)
GFR, Estimated: >60 mL/min (ref >60)
GFR, Estimated: >60 mL/min (ref >60)
Glucose, Bld: 143 mg/dL — ABNORMAL HIGH (ref 70–99)
Glucose, Bld: 114 mg/dL — ABNORMAL HIGH (ref 70–99)
Potassium: 4 mmol/L (ref 3.5–5.1)
Potassium: 3.2 mmol/L — ABNORMAL LOW (ref 3.5–5.1)
Sodium: 149 mmol/L — ABNORMAL HIGH (ref 135–145)
Sodium: 151 mmol/L — ABNORMAL HIGH (ref 135–145)

## 2024-03-06 LAB — MAGNESIUM: Magnesium: 3.7 mg/dL — ABNORMAL HIGH (ref 1.7–2.4)

## 2024-03-06 MED ORDER — METOLAZONE 5 MG PO TABS
10.0000 mg | ORAL_TABLET | Freq: Once | ORAL | Status: AC
  Administered 2024-03-06: 10 mg
  Filled 2024-03-06: qty 2

## 2024-03-06 MED ORDER — CALCIUM GLUCONATE-NACL 2-0.675 GM/100ML-% IV SOLN
2.0000 g | Freq: Once | INTRAVENOUS | Status: AC
  Administered 2024-03-06: 2000 mg via INTRAVENOUS
  Filled 2024-03-06: qty 100

## 2024-03-06 MED ORDER — FUROSEMIDE 10 MG/ML IJ SOLN
40.0000 mg | Freq: Once | INTRAMUSCULAR | Status: AC
  Administered 2024-03-06: 40 mg via INTRAVENOUS
  Filled 2024-03-06: qty 4

## 2024-03-06 MED ORDER — ACETAZOLAMIDE SODIUM 500 MG IJ SOLR
500.0000 mg | Freq: Once | INTRAMUSCULAR | Status: AC
  Administered 2024-03-07: 500 mg via INTRAVENOUS
  Filled 2024-03-06: qty 500

## 2024-03-06 MED ORDER — FUROSEMIDE 10 MG/ML IJ SOLN
60.0000 mg | Freq: Two times a day (BID) | INTRAMUSCULAR | Status: DC
  Administered 2024-03-06: 60 mg via INTRAVENOUS
  Filled 2024-03-06: qty 6

## 2024-03-06 MED ORDER — POTASSIUM CHLORIDE 20 MEQ PO PACK
80.0000 meq | PACK | Freq: Once | ORAL | Status: AC
  Administered 2024-03-06: 80 meq
  Filled 2024-03-06: qty 4

## 2024-03-06 MED ORDER — POLYETHYLENE GLYCOL 3350 17 G PO PACK
17.0000 g | PACK | Freq: Every day | ORAL | Status: DC | PRN
  Administered 2024-03-29: 17 g via ORAL
  Filled 2024-03-06: qty 1

## 2024-03-06 MED ORDER — IBUPROFEN 200 MG PO TABS
600.0000 mg | ORAL_TABLET | Freq: Four times a day (QID) | ORAL | Status: DC | PRN
  Administered 2024-03-06 – 2024-03-12 (×8): 600 mg
  Filled 2024-03-06 (×7): qty 3
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
## 2024-03-06 NOTE — Procedures (Addendum)
 At the bedside in 4N20 the patient's right forearm was prepped and draped in sterile technique.  A dart a-line was used to access the radial artery located with palpation.  The wire was passed and the catheter passed over the wire.  Pulsatile blood returned.  The catheter was connected to the tubing with a good wave form.    Hunter Olds, MD General, Bariatric and Minimally Invasive Surgery [facility]   Primary respiratory acidosis with secondary metabolic alkalosis on ABG.  Will ask RT to increase respiratory rate.  Will give dose of diamox.
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
## 2024-03-06 NOTE — Progress Notes (Signed)
 RT x2 attempted to obtain arterial line with unsuccessful attempts. MD notified

## 2024-03-06 NOTE — Consult Note (Signed)
 WOC reconsulted by orthopedics, orders are current in and appropriate.  Hydrogel to the wound bed, not the skin flap. Cover with single layer of xeroform. Change daily  See other others for boot and WB per ortho. Verdis Koval [facility], CNS, [employer] [phone]

## 2024-03-06 NOTE — Progress Notes (Signed)
 ENT Note: Based on chart review, given persistent vent requirements, fevers, and clinical status, will hold off on MMF currently. Will continue to assess appropriate time for fracture repair.  Continue good oral care Please contact ENT with any questions Hunter Cook

## 2024-03-07 ENCOUNTER — Inpatient Hospital Stay (HOSPITAL_COMMUNITY)

## 2024-03-07 DIAGNOSIS — T1490XA Injury, unspecified, initial encounter: Secondary | ICD-10-CM | POA: Diagnosis not present

## 2024-03-07 DIAGNOSIS — J9601 Acute respiratory failure with hypoxia: Secondary | ICD-10-CM | POA: Diagnosis not present

## 2024-03-07 DIAGNOSIS — J9602 Acute respiratory failure with hypercapnia: Secondary | ICD-10-CM

## 2024-03-07 LAB — URINALYSIS, ROUTINE W REFLEX MICROSCOPIC
Bilirubin Urine: NEGATIVE
Glucose, UA: NEGATIVE mg/dL
Hgb urine dipstick: NEGATIVE
Ketones, ur: NEGATIVE mg/dL
Leukocytes,Ua: NEGATIVE
Nitrite: NEGATIVE
Protein, ur: NEGATIVE mg/dL
Specific Gravity, Urine: 1.008 (ref 1.005–1.030)
pH: 7 (ref 5.0–8.0)

## 2024-03-07 LAB — POCT I-STAT 7, (LYTES, BLD GAS, ICA,H+H)
Acid-Base Excess: 8 mmol/L — ABNORMAL HIGH (ref 0.0–2.0)
Bicarbonate: 34.7 mmol/L — ABNORMAL HIGH (ref 20.0–28.0)
Calcium, Ion: 1.16 mmol/L (ref 1.15–1.40)
HCT: 29 % — ABNORMAL LOW (ref 39.0–52.0)
Hemoglobin: 9.9 g/dL — ABNORMAL LOW (ref 13.0–17.0)
O2 Saturation: 98 %
Patient temperature: 102
Potassium: 3.8 mmol/L (ref 3.5–5.1)
Sodium: 148 mmol/L — ABNORMAL HIGH (ref 135–145)
TCO2: 37 mmol/L — ABNORMAL HIGH (ref 22–32)
pCO2 arterial: 66 mmHg (ref 32–48)
pH, Arterial: 7.338 — ABNORMAL LOW (ref 7.35–7.45)
pO2, Arterial: 117 mmHg — ABNORMAL HIGH (ref 83–108)

## 2024-03-07 LAB — COMPREHENSIVE METABOLIC PANEL WITH GFR
ALT: 32 U/L (ref 0–44)
AST: 46 U/L — ABNORMAL HIGH (ref 15–41)
Albumin: 1.6 g/dL — ABNORMAL LOW (ref 3.5–5.0)
Alkaline Phosphatase: 56 U/L (ref 38–126)
Anion gap: 10 (ref 5–15)
BUN: 26 mg/dL — ABNORMAL HIGH (ref 6–20)
CO2: 30 mmol/L (ref 22–32)
Calcium: 7.9 mg/dL — ABNORMAL LOW (ref 8.9–10.3)
Chloride: 109 mmol/L (ref 98–111)
Creatinine, Ser: 1.1 mg/dL (ref 0.61–1.24)
GFR, Estimated: >60 mL/min (ref >60)
Glucose, Bld: 138 mg/dL — ABNORMAL HIGH (ref 70–99)
Potassium: 3.9 mmol/L (ref 3.5–5.1)
Sodium: 149 mmol/L — ABNORMAL HIGH (ref 135–145)
Total Bilirubin: 0.4 mg/dL (ref 0.0–1.2)
Total Protein: 5.8 g/dL — ABNORMAL LOW (ref 6.5–8.1)

## 2024-03-07 LAB — GLUCOSE, CAPILLARY
Glucose-Capillary: 122 mg/dL — ABNORMAL HIGH (ref 70–99)
Glucose-Capillary: 154 mg/dL — ABNORMAL HIGH (ref 70–99)
Glucose-Capillary: 116 mg/dL — ABNORMAL HIGH (ref 70–99)
Glucose-Capillary: 108 mg/dL — ABNORMAL HIGH (ref 70–99)
Glucose-Capillary: 132 mg/dL — ABNORMAL HIGH (ref 70–99)
Glucose-Capillary: 130 mg/dL — ABNORMAL HIGH (ref 70–99)

## 2024-03-07 LAB — CBC
HCT: 32.2 % — ABNORMAL LOW (ref 39.0–52.0)
Hemoglobin: 10.3 g/dL — ABNORMAL LOW (ref 13.0–17.0)
MCH: 31.9 pg (ref 26.0–34.0)
MCHC: 32 g/dL (ref 30.0–36.0)
MCV: 99.7 fL (ref 80.0–100.0)
Platelets: 499 10*3/uL — ABNORMAL HIGH (ref 150–400)
RBC: 3.23 MIL/uL — ABNORMAL LOW (ref 4.22–5.81)
RDW: 16.7 % — ABNORMAL HIGH (ref 11.5–15.5)
WBC: 20.3 10*3/uL — ABNORMAL HIGH (ref 4.0–10.5)
nRBC: 1.2 % — ABNORMAL HIGH (ref 0.0–0.2)

## 2024-03-07 LAB — TRIGLYCERIDES: Triglycerides: 518 mg/dL — ABNORMAL HIGH (ref <150)

## 2024-03-07 MED ORDER — VANCOMYCIN HCL 10 G IV SOLR
2500.0000 mg | Freq: Once | INTRAVENOUS | Status: AC
  Administered 2024-03-07: 2500 mg via INTRAVENOUS
  Filled 2024-03-07: qty 25

## 2024-03-07 MED ORDER — PIPERACILLIN-TAZOBACTAM 3.375 G IVPB
3.3750 g | Freq: Three times a day (TID) | INTRAVENOUS | Status: DC
  Administered 2024-03-07 – 2024-03-09 (×6): 3.375 g via INTRAVENOUS
  Filled 2024-03-07 (×6): qty 50

## 2024-03-07 MED ORDER — HYDROMORPHONE HCL 1 MG/ML PO LIQD
4.0000 mg | ORAL | Status: DC
  Administered 2024-03-07 – 2024-03-09 (×11): 4 mg
  Filled 2024-03-07 (×11): qty 4

## 2024-03-07 MED ORDER — DOCUSATE SODIUM 50 MG/5ML PO LIQD: 100.0000 mg | Freq: Two times a day (BID) | ORAL | Status: DC

## 2024-03-07 MED ORDER — MIDAZOLAM-SODIUM CHLORIDE 100-0.9 MG/100ML-% IV SOLN
0.0000 mg/h | INTRAVENOUS | Status: DC
  Administered 2024-03-07: 2 mg/h via INTRAVENOUS
  Administered 2024-03-08 – 2024-03-10 (×3): 4 mg/h via INTRAVENOUS
  Administered 2024-03-11: 3 mg/h via INTRAVENOUS
  Filled 2024-03-07 (×5): qty 100

## 2024-03-07 MED ORDER — HYDROMORPHONE BOLUS VIA INFUSION
0.2500 mg | INTRAVENOUS | Status: DC | PRN
  Administered 2024-03-07 – 2024-03-14 (×30): 2 mg via INTRAVENOUS
  Administered 2024-03-15: 1 mg via INTRAVENOUS
  Administered 2024-03-17 – 2024-03-18 (×2): 0.5 mg via INTRAVENOUS
  Administered 2024-03-18: 2 mg via INTRAVENOUS
  Administered 2024-03-18: 0.5 mg via INTRAVENOUS
  Administered 2024-03-19: 1 mg via INTRAVENOUS
  Administered 2024-03-19: 2 mg via INTRAVENOUS

## 2024-03-07 MED ORDER — MIDAZOLAM BOLUS VIA INFUSION
0.0000 mg | INTRAVENOUS | Status: DC | PRN
  Administered 2024-03-07 (×3): 2 mg via INTRAVENOUS
  Administered 2024-03-07: 1 mg via INTRAVENOUS
  Administered 2024-03-07 – 2024-03-10 (×6): 2 mg via INTRAVENOUS

## 2024-03-07 MED ORDER — FUROSEMIDE 10 MG/ML IJ SOLN
40.0000 mg | Freq: Two times a day (BID) | INTRAMUSCULAR | Status: DC
  Administered 2024-03-07 – 2024-03-11 (×9): 40 mg via INTRAVENOUS
  Filled 2024-03-07 (×9): qty 4

## 2024-03-07 MED ORDER — SENNOSIDES 8.8 MG/5ML PO SYRP
5.0000 mL | ORAL_SOLUTION | Freq: Two times a day (BID) | ORAL | Status: DC
  Filled 2024-03-07: qty 5

## 2024-03-07 MED ORDER — ARFORMOTEROL TARTRATE 15 MCG/2ML IN NEBU
15.0000 ug | INHALATION_SOLUTION | Freq: Two times a day (BID) | RESPIRATORY_TRACT | Status: DC
  Administered 2024-03-07 – 2024-04-20 (×84): 15 ug via RESPIRATORY_TRACT
  Filled 2024-03-07 (×90): qty 2

## 2024-03-07 MED ORDER — SENNOSIDES 8.8 MG/5ML PO SYRP
5.0000 mL | ORAL_SOLUTION | Freq: Two times a day (BID) | ORAL | Status: DC
  Administered 2024-03-07 – 2024-03-12 (×7): 5 mL
  Filled 2024-03-07 (×13): qty 5

## 2024-03-07 MED ORDER — POLYETHYLENE GLYCOL 3350 17 G PO PACK
17.0000 g | PACK | Freq: Every day | ORAL | Status: DC
  Administered 2024-03-07 – 2024-03-10 (×3): 17 g
  Filled 2024-03-07 (×3): qty 1

## 2024-03-07 MED ORDER — HYDROMORPHONE HCL-NACL 50-0.9 MG/50ML-% IV SOLN
0.5000 mg/h | INTRAVENOUS | Status: DC
  Administered 2024-03-07: 1 mg/h via INTRAVENOUS
  Administered 2024-03-07: 2.5 mg/h via INTRAVENOUS
  Administered 2024-03-08 – 2024-03-10 (×4): 3 mg/h via INTRAVENOUS
  Administered 2024-03-11: 2 mg/h via INTRAVENOUS
  Administered 2024-03-12: 1 mg/h via INTRAVENOUS
  Administered 2024-03-12: 0.5 mg/h via INTRAVENOUS
  Administered 2024-03-13 – 2024-03-16 (×3): 1 mg/h via INTRAVENOUS
  Filled 2024-03-07 (×13): qty 50

## 2024-03-07 MED ORDER — REVEFENACIN 175 MCG/3ML IN SOLN
175.0000 ug | Freq: Every day | RESPIRATORY_TRACT | Status: DC
  Administered 2024-03-07 – 2024-04-20 (×43): 175 ug via RESPIRATORY_TRACT
  Filled 2024-03-07 (×45): qty 3

## 2024-03-07 MED ORDER — HYDROMORPHONE HCL 1 MG/ML PO LIQD: 4.0000 mg | ORAL | Status: DC

## 2024-03-07 MED ORDER — DIAZEPAM 5 MG PO TABS
20.0000 mg | ORAL_TABLET | Freq: Four times a day (QID) | ORAL | Status: DC
  Filled 2024-03-07: qty 4

## 2024-03-07 MED ORDER — HYDROMORPHONE HCL 1 MG/ML IJ SOLN
1.0000 mg | Freq: Once | INTRAMUSCULAR | Status: AC
  Administered 2024-03-07: 1 mg via INTRAVENOUS
  Filled 2024-03-07: qty 1

## 2024-03-07 MED ORDER — STERILE WATER FOR INJECTION IJ SOLN
INTRAMUSCULAR | Status: AC
  Administered 2024-03-07: 10 mL
  Filled 2024-03-07: qty 10

## 2024-03-07 MED ORDER — DIAZEPAM 5 MG PO TABS
20.0000 mg | ORAL_TABLET | Freq: Four times a day (QID) | ORAL | Status: DC
  Administered 2024-03-07 – 2024-03-24 (×69): 20 mg
  Filled 2024-03-07 (×70): qty 4

## 2024-03-07 MED ORDER — VANCOMYCIN HCL IN DEXTROSE 1-5 GM/200ML-% IV SOLN
1000.0000 mg | Freq: Three times a day (TID) | INTRAVENOUS | Status: DC
Start: 2024-03-07 — End: 2024-03-10
  Administered 2024-03-07 – 2024-03-10 (×8): 1000 mg via INTRAVENOUS
  Filled 2024-03-07 (×9): qty 200
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
## 2024-03-07 NOTE — Progress Notes (Signed)
..  Trauma Event Note    Spoke with Dr. Marny Sires regarding reports of transient hypotension and edema reported by primary RN. No new orders at this time as it appears VS have stabilized.   Last imported Vital Signs BP (!) 117/58   Pulse 80   Temp 100.2 F (37.9 C) (Axillary)   Resp (!) 26   Ht 5\' 9"  (1.753 m)   Wt 289 lb 7.4 oz (131.3 kg)   SpO2 98%   BMI 42.75 kg/m   Trending CBC Recent Labs    03/06/24 2208 03/07/24 0345 03/07/24 0508  WBC  --   --  20.3*  HGB 10.5* 9.9* 10.3*  HCT 31.0* 29.0* 32.2*  PLT  --   --  499*    Trending Coag's No results for input(s): "APTT", "INR" in the last 72 hours.  Trending BMET Recent Labs    03/06/24 0527 03/06/24 0823 03/06/24 1219 03/06/24 2208 03/07/24 0345 03/07/24 0508  NA 149*   < > 151* 149* 148* 149*  K 4.0   < > 3.2* 4.0 3.8 3.9  CL 111  --  110  --   --  109  CO2 29  --  28  --   --  30  BUN 26*  --  22*  --   --  26*  CREATININE 1.22  --  1.42*  --   --  1.10  GLUCOSE 143*  --  114*  --   --  138*   < > = values in this interval not displayed.      Hunter Cook  Trauma Response RN  Please call TRN at [phone] for further assistance.

## 2024-03-07 NOTE — Progress Notes (Signed)
 Art line blood pressure with MAP reading 61-63. Called TRN to inform and asked if we needed levophed. Instructed to keep systolic above 90 mmHg and MAP >60.
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
## 2024-03-08 ENCOUNTER — Inpatient Hospital Stay (HOSPITAL_COMMUNITY)

## 2024-03-08 DIAGNOSIS — J9601 Acute respiratory failure with hypoxia: Secondary | ICD-10-CM | POA: Diagnosis not present

## 2024-03-08 DIAGNOSIS — T1490XA Injury, unspecified, initial encounter: Secondary | ICD-10-CM | POA: Diagnosis not present

## 2024-03-08 DIAGNOSIS — J9602 Acute respiratory failure with hypercapnia: Secondary | ICD-10-CM | POA: Diagnosis not present

## 2024-03-08 LAB — CBC
HCT: 32.7 % — ABNORMAL LOW (ref 39.0–52.0)
Hemoglobin: 10.8 g/dL — ABNORMAL LOW (ref 13.0–17.0)
MCH: 32.3 pg (ref 26.0–34.0)
MCHC: 33 g/dL (ref 30.0–36.0)
MCV: 97.9 fL (ref 80.0–100.0)
Platelets: 628 10*3/uL — ABNORMAL HIGH (ref 150–400)
RBC: 3.34 MIL/uL — ABNORMAL LOW (ref 4.22–5.81)
RDW: 16.6 % — ABNORMAL HIGH (ref 11.5–15.5)
WBC: 21.8 10*3/uL — ABNORMAL HIGH (ref 4.0–10.5)
nRBC: 0.9 % — ABNORMAL HIGH (ref 0.0–0.2)

## 2024-03-08 LAB — COMPREHENSIVE METABOLIC PANEL WITH GFR
ALT: 29 U/L (ref 0–44)
AST: 45 U/L — ABNORMAL HIGH (ref 15–41)
Albumin: 1.7 g/dL — ABNORMAL LOW (ref 3.5–5.0)
Alkaline Phosphatase: 58 U/L (ref 38–126)
Anion gap: 12 (ref 5–15)
BUN: 27 mg/dL — ABNORMAL HIGH (ref 6–20)
CO2: 30 mmol/L (ref 22–32)
Calcium: 8.4 mg/dL — ABNORMAL LOW (ref 8.9–10.3)
Chloride: 103 mmol/L (ref 98–111)
Creatinine, Ser: 0.94 mg/dL (ref 0.61–1.24)
GFR, Estimated: >60 mL/min (ref >60)
Glucose, Bld: 168 mg/dL — ABNORMAL HIGH (ref 70–99)
Potassium: 4.2 mmol/L (ref 3.5–5.1)
Sodium: 145 mmol/L (ref 135–145)
Total Bilirubin: 0.7 mg/dL (ref 0.0–1.2)
Total Protein: 6.5 g/dL (ref 6.5–8.1)

## 2024-03-08 LAB — GLUCOSE, CAPILLARY
Glucose-Capillary: 142 mg/dL — ABNORMAL HIGH (ref 70–99)
Glucose-Capillary: 138 mg/dL — ABNORMAL HIGH (ref 70–99)
Glucose-Capillary: 154 mg/dL — ABNORMAL HIGH (ref 70–99)
Glucose-Capillary: 115 mg/dL — ABNORMAL HIGH (ref 70–99)
Glucose-Capillary: 141 mg/dL — ABNORMAL HIGH (ref 70–99)
Glucose-Capillary: 128 mg/dL — ABNORMAL HIGH (ref 70–99)
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
## 2024-03-09 DIAGNOSIS — J9602 Acute respiratory failure with hypercapnia: Secondary | ICD-10-CM | POA: Diagnosis not present

## 2024-03-09 DIAGNOSIS — J9601 Acute respiratory failure with hypoxia: Secondary | ICD-10-CM | POA: Diagnosis not present

## 2024-03-09 DIAGNOSIS — T1490XA Injury, unspecified, initial encounter: Secondary | ICD-10-CM | POA: Diagnosis not present

## 2024-03-09 LAB — CBC WITH DIFFERENTIAL/PLATELET
Abs Immature Granulocytes: 1.44 10*3/uL — ABNORMAL HIGH (ref 0.00–0.07)
Basophils Absolute: 0.1 10*3/uL (ref 0.0–0.1)
Basophils Relative: 0 %
Eosinophils Absolute: 0.9 10*3/uL — ABNORMAL HIGH (ref 0.0–0.5)
Eosinophils Relative: 4 %
HCT: 32.6 % — ABNORMAL LOW (ref 39.0–52.0)
Hemoglobin: 10.8 g/dL — ABNORMAL LOW (ref 13.0–17.0)
Immature Granulocytes: 6 %
Lymphocytes Relative: 10 %
Lymphs Abs: 2.4 10*3/uL (ref 0.7–4.0)
MCH: 31.6 pg (ref 26.0–34.0)
MCHC: 33.1 g/dL (ref 30.0–36.0)
MCV: 95.3 fL (ref 80.0–100.0)
Monocytes Absolute: 0.9 10*3/uL (ref 0.1–1.0)
Monocytes Relative: 4 %
Neutro Abs: 17.5 10*3/uL — ABNORMAL HIGH (ref 1.7–7.7)
Neutrophils Relative %: 76 %
Platelets: 774 10*3/uL — ABNORMAL HIGH (ref 150–400)
RBC: 3.42 MIL/uL — ABNORMAL LOW (ref 4.22–5.81)
RDW: 16.4 % — ABNORMAL HIGH (ref 11.5–15.5)
WBC: 23.2 10*3/uL — ABNORMAL HIGH (ref 4.0–10.5)
nRBC: 1.2 % — ABNORMAL HIGH (ref 0.0–0.2)

## 2024-03-09 LAB — POCT I-STAT 7, (LYTES, BLD GAS, ICA,H+H)
Acid-Base Excess: 8 mmol/L — ABNORMAL HIGH (ref 0.0–2.0)
Bicarbonate: 33.7 mmol/L — ABNORMAL HIGH (ref 20.0–28.0)
Calcium, Ion: 1.14 mmol/L — ABNORMAL LOW (ref 1.15–1.40)
HCT: 34 % — ABNORMAL LOW (ref 39.0–52.0)
Hemoglobin: 11.6 g/dL — ABNORMAL LOW (ref 13.0–17.0)
O2 Saturation: 90 %
Patient temperature: 100
Potassium: 3.8 mmol/L (ref 3.5–5.1)
Sodium: 146 mmol/L — ABNORMAL HIGH (ref 135–145)
TCO2: 35 mmol/L — ABNORMAL HIGH (ref 22–32)
pCO2 arterial: 53 mmHg — ABNORMAL HIGH (ref 32–48)
pH, Arterial: 7.415 (ref 7.35–7.45)
pO2, Arterial: 63 mmHg — ABNORMAL LOW (ref 83–108)

## 2024-03-09 LAB — GLUCOSE, CAPILLARY
Glucose-Capillary: 142 mg/dL — ABNORMAL HIGH (ref 70–99)
Glucose-Capillary: 157 mg/dL — ABNORMAL HIGH (ref 70–99)
Glucose-Capillary: 175 mg/dL — ABNORMAL HIGH (ref 70–99)
Glucose-Capillary: 161 mg/dL — ABNORMAL HIGH (ref 70–99)
Glucose-Capillary: 169 mg/dL — ABNORMAL HIGH (ref 70–99)
Glucose-Capillary: 152 mg/dL — ABNORMAL HIGH (ref 70–99)

## 2024-03-09 LAB — BASIC METABOLIC PANEL WITH GFR
Anion gap: 12 (ref 5–15)
BUN: 37 mg/dL — ABNORMAL HIGH (ref 6–20)
CO2: 33 mmol/L — ABNORMAL HIGH (ref 22–32)
Calcium: 8.5 mg/dL — ABNORMAL LOW (ref 8.9–10.3)
Chloride: 102 mmol/L (ref 98–111)
Creatinine, Ser: 0.97 mg/dL (ref 0.61–1.24)
GFR, Estimated: >60 mL/min (ref >60)
Glucose, Bld: 155 mg/dL — ABNORMAL HIGH (ref 70–99)
Potassium: 4.1 mmol/L (ref 3.5–5.1)
Sodium: 147 mmol/L — ABNORMAL HIGH (ref 135–145)

## 2024-03-09 LAB — MAGNESIUM: Magnesium: 2.4 mg/dL (ref 1.7–2.4)

## 2024-03-09 LAB — VANCOMYCIN, PEAK: Vancomycin Pk: 27 ug/mL — ABNORMAL LOW (ref 30–40)

## 2024-03-09 MED ORDER — OXYCODONE HCL 5 MG PO TABS
10.0000 mg | ORAL_TABLET | ORAL | Status: DC
  Administered 2024-03-09 – 2024-03-11 (×11): 10 mg
  Filled 2024-03-09 (×12): qty 2

## 2024-03-09 MED ORDER — METOPROLOL TARTRATE 5 MG/5ML IV SOLN
5.0000 mg | Freq: Four times a day (QID) | INTRAVENOUS | Status: AC | PRN
  Administered 2024-03-09 – 2024-03-11 (×4): 5 mg via INTRAVENOUS
  Filled 2024-03-09 (×3): qty 5

## 2024-03-09 MED ORDER — QUETIAPINE FUMARATE 100 MG PO TABS
100.0000 mg | ORAL_TABLET | Freq: Two times a day (BID) | ORAL | Status: DC
  Administered 2024-03-09 – 2024-03-10 (×4): 100 mg
  Filled 2024-03-09 (×5): qty 1

## 2024-03-09 MED ORDER — SODIUM CHLORIDE 0.9 % IV SOLN
2.0000 g | Freq: Three times a day (TID) | INTRAVENOUS | Status: DC
  Administered 2024-03-09 – 2024-03-10 (×4): 2 g via INTRAVENOUS
  Filled 2024-03-09 (×4): qty 12.5

## 2024-03-09 MED ORDER — OXYCODONE HCL 5 MG PO TABS
10.0000 mg | ORAL_TABLET | Freq: Four times a day (QID) | ORAL | Status: DC
  Administered 2024-03-09: 10 mg
  Filled 2024-03-09: qty 2
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
## 2024-03-09 NOTE — Progress Notes (Signed)
 Labs reviewed 4/14 WBC 23.2/ HGB 10.8/platelets 774 Na 147/ K 4.1/ CO2 33/ BUN 37/ Creatinine 0.97/ Mag 2.4/  T Max 100.2 Blood Cx done 4/12 No Growth Sputum Cx 4/12 >> Few Klebsiella/ Few Staph Aureus On Vanc and Maxipime Net + 11.9 L per I&O>> On Lasix BID CXR ordered for am
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
## 2024-03-10 ENCOUNTER — Inpatient Hospital Stay (HOSPITAL_COMMUNITY)

## 2024-03-10 DIAGNOSIS — J9602 Acute respiratory failure with hypercapnia: Secondary | ICD-10-CM | POA: Diagnosis not present

## 2024-03-10 DIAGNOSIS — T1490XA Injury, unspecified, initial encounter: Secondary | ICD-10-CM | POA: Diagnosis not present

## 2024-03-10 DIAGNOSIS — J189 Pneumonia, unspecified organism: Secondary | ICD-10-CM | POA: Diagnosis not present

## 2024-03-10 DIAGNOSIS — J9601 Acute respiratory failure with hypoxia: Secondary | ICD-10-CM | POA: Diagnosis not present

## 2024-03-10 LAB — BASIC METABOLIC PANEL WITH GFR
Anion gap: 11 (ref 5–15)
BUN: 38 mg/dL — ABNORMAL HIGH (ref 6–20)
CO2: 34 mmol/L — ABNORMAL HIGH (ref 22–32)
Calcium: 8.6 mg/dL — ABNORMAL LOW (ref 8.9–10.3)
Chloride: 102 mmol/L (ref 98–111)
Creatinine, Ser: 0.9 mg/dL (ref 0.61–1.24)
GFR, Estimated: >60 mL/min (ref >60)
Glucose, Bld: 180 mg/dL — ABNORMAL HIGH (ref 70–99)
Potassium: 3.7 mmol/L (ref 3.5–5.1)
Sodium: 147 mmol/L — ABNORMAL HIGH (ref 135–145)

## 2024-03-10 LAB — CBC WITH DIFFERENTIAL/PLATELET
Abs Immature Granulocytes: 0 10*3/uL (ref 0.00–0.07)
Basophils Absolute: 0.2 10*3/uL — ABNORMAL HIGH (ref 0.0–0.1)
Basophils Relative: 1 %
Eosinophils Absolute: 0.9 10*3/uL — ABNORMAL HIGH (ref 0.0–0.5)
Eosinophils Relative: 4 %
HCT: 32.5 % — ABNORMAL LOW (ref 39.0–52.0)
Hemoglobin: 10.9 g/dL — ABNORMAL LOW (ref 13.0–17.0)
Lymphocytes Relative: 6 %
Lymphs Abs: 1.4 10*3/uL (ref 0.7–4.0)
MCH: 32.4 pg (ref 26.0–34.0)
MCHC: 33.5 g/dL (ref 30.0–36.0)
MCV: 96.7 fL (ref 80.0–100.0)
Monocytes Absolute: 1.1 10*3/uL — ABNORMAL HIGH (ref 0.1–1.0)
Monocytes Relative: 5 %
Neutro Abs: 19 10*3/uL — ABNORMAL HIGH (ref 1.7–7.7)
Neutrophils Relative %: 84 %
Platelets: 825 10*3/uL — ABNORMAL HIGH (ref 150–400)
RBC: 3.36 MIL/uL — ABNORMAL LOW (ref 4.22–5.81)
RDW: 16.2 % — ABNORMAL HIGH (ref 11.5–15.5)
WBC: 22.6 10*3/uL — ABNORMAL HIGH (ref 4.0–10.5)
nRBC: 2 /100{WBCs} — ABNORMAL HIGH
nRBC: 2.1 % — ABNORMAL HIGH (ref 0.0–0.2)

## 2024-03-10 LAB — TRIGLYCERIDES: Triglycerides: 388 mg/dL — ABNORMAL HIGH (ref <150)

## 2024-03-10 LAB — VANCOMYCIN, TROUGH: Vancomycin Tr: 14 ug/mL — ABNORMAL LOW (ref 15–20)

## 2024-03-10 LAB — CULTURE, RESPIRATORY W GRAM STAIN

## 2024-03-10 LAB — GLUCOSE, CAPILLARY
Glucose-Capillary: 178 mg/dL — ABNORMAL HIGH (ref 70–99)
Glucose-Capillary: 197 mg/dL — ABNORMAL HIGH (ref 70–99)
Glucose-Capillary: 170 mg/dL — ABNORMAL HIGH (ref 70–99)
Glucose-Capillary: 199 mg/dL — ABNORMAL HIGH (ref 70–99)
Glucose-Capillary: 160 mg/dL — ABNORMAL HIGH (ref 70–99)
Glucose-Capillary: 191 mg/dL — ABNORMAL HIGH (ref 70–99)

## 2024-03-10 LAB — BRAIN NATRIURETIC PEPTIDE: B Natriuretic Peptide: 26.6 pg/mL (ref 0.0–100.0)

## 2024-03-10 LAB — PHOSPHORUS: Phosphorus: 2.5 mg/dL (ref 2.5–4.6)

## 2024-03-10 LAB — MAGNESIUM: Magnesium: 2.4 mg/dL (ref 1.7–2.4)

## 2024-03-10 MED ORDER — SULFAMETHOXAZOLE-TRIMETHOPRIM 800-160 MG PO TABS
2.0000 | ORAL_TABLET | Freq: Two times a day (BID) | ORAL | Status: DC
  Administered 2024-03-10: 2
  Filled 2024-03-10 (×3): qty 2

## 2024-03-10 MED ORDER — MAGNESIUM HYDROXIDE 400 MG/5ML PO SUSP: 30.0000 mL | Freq: Once | ORAL | Status: DC

## 2024-03-10 MED ORDER — METOPROLOL TARTRATE 50 MG PO TABS
50.0000 mg | ORAL_TABLET | Freq: Two times a day (BID) | ORAL | Status: DC
  Administered 2024-03-10: 50 mg via ORAL
  Filled 2024-03-10: qty 1

## 2024-03-10 MED ORDER — POLYETHYLENE GLYCOL 3350 17 G PO PACK
17.0000 g | PACK | Freq: Two times a day (BID) | ORAL | Status: DC
  Administered 2024-03-12: 17 g
  Filled 2024-03-10 (×2): qty 1

## 2024-03-10 MED ORDER — SENNA 8.6 MG PO TABS: 2.0000 | ORAL_TABLET | Freq: Once | ORAL | Status: DC

## 2024-03-10 MED ORDER — BISACODYL 10 MG RE SUPP: 10.0000 mg | Freq: Once | RECTAL | Status: DC

## 2024-03-10 MED ORDER — VALPROIC ACID 250 MG/5ML PO SOLN
500.0000 mg | Freq: Three times a day (TID) | ORAL | Status: DC
  Administered 2024-03-10 – 2024-03-20 (×30): 500 mg
  Filled 2024-03-10 (×30): qty 10

## 2024-03-10 MED ORDER — MIDAZOLAM BOLUS VIA INFUSION
2.0000 mg | INTRAVENOUS | Status: DC | PRN
  Administered 2024-03-11 – 2024-03-12 (×4): 2 mg via INTRAVENOUS
  Administered 2024-03-12: 4 mg via INTRAVENOUS

## 2024-03-10 MED ORDER — METOPROLOL TARTRATE 50 MG PO TABS
50.0000 mg | ORAL_TABLET | Freq: Two times a day (BID) | ORAL | Status: DC
  Administered 2024-03-10 – 2024-03-25 (×29): 50 mg
  Filled 2024-03-10 (×29): qty 1

## 2024-03-10 MED ORDER — BISACODYL 5 MG PO TBEC
10.0000 mg | DELAYED_RELEASE_TABLET | Freq: Once | ORAL | Status: DC
Start: 2024-03-10 — End: 2024-03-13
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
## 2024-03-11 ENCOUNTER — Encounter (HOSPITAL_COMMUNITY): Payer: Self-pay

## 2024-03-11 DIAGNOSIS — J9602 Acute respiratory failure with hypercapnia: Secondary | ICD-10-CM | POA: Diagnosis not present

## 2024-03-11 DIAGNOSIS — S02652A Fracture of angle of left mandible, initial encounter for closed fracture: Secondary | ICD-10-CM | POA: Diagnosis not present

## 2024-03-11 DIAGNOSIS — J189 Pneumonia, unspecified organism: Secondary | ICD-10-CM | POA: Diagnosis not present

## 2024-03-11 DIAGNOSIS — J9601 Acute respiratory failure with hypoxia: Secondary | ICD-10-CM | POA: Diagnosis not present

## 2024-03-11 DIAGNOSIS — T1490XA Injury, unspecified, initial encounter: Secondary | ICD-10-CM | POA: Diagnosis not present

## 2024-03-11 LAB — GLUCOSE, CAPILLARY
Glucose-Capillary: 155 mg/dL — ABNORMAL HIGH (ref 70–99)
Glucose-Capillary: 185 mg/dL — ABNORMAL HIGH (ref 70–99)
Glucose-Capillary: 207 mg/dL — ABNORMAL HIGH (ref 70–99)
Glucose-Capillary: 186 mg/dL — ABNORMAL HIGH (ref 70–99)
Glucose-Capillary: 160 mg/dL — ABNORMAL HIGH (ref 70–99)
Glucose-Capillary: 225 mg/dL — ABNORMAL HIGH (ref 70–99)

## 2024-03-11 LAB — CBC
HCT: 34.8 % — ABNORMAL LOW (ref 39.0–52.0)
Hemoglobin: 10.9 g/dL — ABNORMAL LOW (ref 13.0–17.0)
MCH: 31.2 pg (ref 26.0–34.0)
MCHC: 31.3 g/dL (ref 30.0–36.0)
MCV: 99.7 fL (ref 80.0–100.0)
Platelets: 932 10*3/uL (ref 150–400)
RBC: 3.49 MIL/uL — ABNORMAL LOW (ref 4.22–5.81)
RDW: 15.9 % — ABNORMAL HIGH (ref 11.5–15.5)
WBC: 20.8 10*3/uL — ABNORMAL HIGH (ref 4.0–10.5)
nRBC: 5 % — ABNORMAL HIGH (ref 0.0–0.2)

## 2024-03-11 LAB — BASIC METABOLIC PANEL WITH GFR
Anion gap: 13 (ref 5–15)
BUN: 45 mg/dL — ABNORMAL HIGH (ref 6–20)
CO2: 34 mmol/L — ABNORMAL HIGH (ref 22–32)
Calcium: 8.5 mg/dL — ABNORMAL LOW (ref 8.9–10.3)
Chloride: 104 mmol/L (ref 98–111)
Creatinine, Ser: 1.1 mg/dL (ref 0.61–1.24)
GFR, Estimated: >60 mL/min (ref >60)
Glucose, Bld: 164 mg/dL — ABNORMAL HIGH (ref 70–99)
Potassium: 3.2 mmol/L — ABNORMAL LOW (ref 3.5–5.1)
Sodium: 151 mmol/L — ABNORMAL HIGH (ref 135–145)

## 2024-03-11 LAB — MAGNESIUM: Magnesium: 2.5 mg/dL — ABNORMAL HIGH (ref 1.7–2.4)

## 2024-03-11 MED ORDER — LORAZEPAM 2 MG/ML IJ SOLN: 1.0000 mg | INTRAMUSCULAR | Status: DC | PRN

## 2024-03-11 MED ORDER — FENTANYL CITRATE PF 50 MCG/ML IJ SOSY: 25.0000 ug | PREFILLED_SYRINGE | Freq: Once | INTRAMUSCULAR | Status: AC

## 2024-03-11 MED ORDER — QUETIAPINE FUMARATE 100 MG PO TABS
200.0000 mg | ORAL_TABLET | Freq: Every day | ORAL | Status: DC
  Administered 2024-03-11 – 2024-03-29 (×19): 200 mg
  Filled 2024-03-11 (×2): qty 2
  Filled 2024-03-11 (×7): qty 1
  Filled 2024-03-11: qty 2
  Filled 2024-03-11: qty 1
  Filled 2024-03-11: qty 2
  Filled 2024-03-11 (×3): qty 1
  Filled 2024-03-11: qty 2
  Filled 2024-03-11 (×2): qty 1
  Filled 2024-03-11: qty 2

## 2024-03-11 MED ORDER — FREE WATER
200.0000 mL | Status: DC
  Administered 2024-03-11 – 2024-03-14 (×30): 200 mL

## 2024-03-11 MED ORDER — POTASSIUM CHLORIDE 10 MEQ/100ML IV SOLN
10.0000 meq | INTRAVENOUS | Status: AC
  Administered 2024-03-11 (×4): 10 meq via INTRAVENOUS
  Filled 2024-03-11: qty 100

## 2024-03-11 MED ORDER — PHENOBARBITAL SODIUM 65 MG/ML IJ SOLN
65.0000 mg | Freq: Three times a day (TID) | INTRAMUSCULAR | Status: AC
  Administered 2024-03-13 – 2024-03-15 (×6): 65 mg via INTRAVENOUS
  Filled 2024-03-11 (×6): qty 1

## 2024-03-11 MED ORDER — HYDRALAZINE HCL 20 MG/ML IJ SOLN
INTRAMUSCULAR | Status: AC
  Filled 2024-03-11: qty 1

## 2024-03-11 MED ORDER — FREE WATER
200.0000 mL | Status: DC
  Administered 2024-03-11: 200 mL

## 2024-03-11 MED ORDER — FENTANYL CITRATE PF 50 MCG/ML IJ SOSY
PREFILLED_SYRINGE | INTRAMUSCULAR | Status: AC
  Administered 2024-03-11: 25 ug via INTRAVENOUS
  Filled 2024-03-11: qty 1

## 2024-03-11 MED ORDER — HYDRALAZINE HCL 20 MG/ML IJ SOLN: 10.0000 mg | Freq: Once | INTRAMUSCULAR | Status: AC

## 2024-03-11 MED ORDER — PHENOBARBITAL SODIUM 65 MG/ML IJ SOLN
32.5000 mg | Freq: Three times a day (TID) | INTRAMUSCULAR | Status: AC
  Administered 2024-03-15 – 2024-03-17 (×6): 32.5 mg via INTRAVENOUS
  Filled 2024-03-11 (×6): qty 1

## 2024-03-11 MED ORDER — OXYCODONE HCL 5 MG PO TABS
15.0000 mg | ORAL_TABLET | ORAL | Status: DC
  Administered 2024-03-11 – 2024-03-24 (×79): 15 mg
  Filled 2024-03-11 (×79): qty 3

## 2024-03-11 MED ORDER — PHENOBARBITAL SODIUM 130 MG/ML IJ SOLN
97.5000 mg | Freq: Three times a day (TID) | INTRAMUSCULAR | Status: AC
  Administered 2024-03-11 – 2024-03-13 (×6): 97.5 mg via INTRAVENOUS
  Filled 2024-03-11 (×6): qty 1

## 2024-03-11 MED ORDER — POTASSIUM CHLORIDE 20 MEQ PO PACK
20.0000 meq | PACK | Freq: Once | ORAL | Status: AC
  Administered 2024-03-11: 20 meq
  Filled 2024-03-11: qty 1

## 2024-03-11 MED ORDER — SODIUM CHLORIDE 0.9 % IV SOLN
700.0000 mg | Freq: Once | INTRAVENOUS | Status: AC
  Administered 2024-03-11: 700 mg via INTRAVENOUS
  Filled 2024-03-11: qty 5.38

## 2024-03-11 MED ORDER — PIPERACILLIN-TAZOBACTAM 3.375 G IVPB
3.3750 g | Freq: Three times a day (TID) | INTRAVENOUS | Status: AC
Start: 2024-03-11 — End: 2024-03-16
  Administered 2024-03-11 – 2024-03-16 (×17): 3.375 g via INTRAVENOUS
  Filled 2024-03-11 (×17): qty 50
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
## 2024-03-11 NOTE — Progress Notes (Signed)
 Date and time results received: 03/11/24 1256 (use smartphrase ".now" to insert current time)  Test: Platelet Critical Value: 932  Name of Provider Notified: Donivan Furry, MD  Orders Received? Or Actions Taken?:  Notified, no new orders

## 2024-03-11 NOTE — Progress Notes (Signed)
   03/11/24 1300  Vitals  BP 129/68  MAP (mmHg) 84  Pulse Rate (!) 105  ECG Heart Rate (!) 105  Resp (!) 26  Oxygen Therapy  SpO2 98 %  Art Line  Arterial Line BP 168/65  Arterial Line MAP (mmHg) 87 mmHg  MEWS Score  MEWS Temp 2  MEWS Systolic 0  MEWS Pulse 1  MEWS RR 2  MEWS LOC 2  MEWS Score 7  MEWS Score Color Red  Provider Notification  Provider Name/Title Donivan Furry, MD  Date Provider Notified 03/11/24  Time Provider Notified 1259  Method of Notification Call  Notification Reason Critical Result  Test performed and critical result Platelet, 932  Date Critical Result Received 03/11/24  Time Critical Result Received 1258  Provider response No new orders  Date of Provider Response 03/11/24  Time of Provider Response 1259
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
## 2024-03-12 ENCOUNTER — Inpatient Hospital Stay (HOSPITAL_COMMUNITY)

## 2024-03-12 DIAGNOSIS — J9601 Acute respiratory failure with hypoxia: Secondary | ICD-10-CM | POA: Diagnosis not present

## 2024-03-12 DIAGNOSIS — J9602 Acute respiratory failure with hypercapnia: Secondary | ICD-10-CM | POA: Diagnosis not present

## 2024-03-12 DIAGNOSIS — J15211 Pneumonia due to Methicillin susceptible Staphylococcus aureus: Secondary | ICD-10-CM

## 2024-03-12 DIAGNOSIS — S27329A Contusion of lung, unspecified, initial encounter: Secondary | ICD-10-CM

## 2024-03-12 DIAGNOSIS — E870 Hyperosmolality and hypernatremia: Secondary | ICD-10-CM

## 2024-03-12 LAB — CBC
HCT: 32 % — ABNORMAL LOW (ref 39.0–52.0)
Hemoglobin: 10.2 g/dL — ABNORMAL LOW (ref 13.0–17.0)
MCH: 31.7 pg (ref 26.0–34.0)
MCHC: 31.9 g/dL (ref 30.0–36.0)
MCV: 99.4 fL (ref 80.0–100.0)
Platelets: 932 10*3/uL (ref 150–400)
RBC: 3.22 MIL/uL — ABNORMAL LOW (ref 4.22–5.81)
RDW: 15.7 % — ABNORMAL HIGH (ref 11.5–15.5)
WBC: 15 10*3/uL — ABNORMAL HIGH (ref 4.0–10.5)
nRBC: 4.2 % — ABNORMAL HIGH (ref 0.0–0.2)

## 2024-03-12 LAB — CULTURE, BLOOD (ROUTINE X 2)
Culture: NO GROWTH
Culture: NO GROWTH
Special Requests: ADEQUATE
Special Requests: ADEQUATE

## 2024-03-12 LAB — BASIC METABOLIC PANEL WITH GFR
Anion gap: 9 (ref 5–15)
BUN: 42 mg/dL — ABNORMAL HIGH (ref 6–20)
CO2: 30 mmol/L (ref 22–32)
Calcium: 8.6 mg/dL — ABNORMAL LOW (ref 8.9–10.3)
Chloride: 109 mmol/L (ref 98–111)
Creatinine, Ser: 0.83 mg/dL (ref 0.61–1.24)
GFR, Estimated: >60 mL/min (ref >60)
Glucose, Bld: 166 mg/dL — ABNORMAL HIGH (ref 70–99)
Potassium: 3.5 mmol/L (ref 3.5–5.1)
Sodium: 148 mmol/L — ABNORMAL HIGH (ref 135–145)

## 2024-03-12 LAB — GLUCOSE, CAPILLARY
Glucose-Capillary: 144 mg/dL — ABNORMAL HIGH (ref 70–99)
Glucose-Capillary: 144 mg/dL — ABNORMAL HIGH (ref 70–99)
Glucose-Capillary: 140 mg/dL — ABNORMAL HIGH (ref 70–99)
Glucose-Capillary: 115 mg/dL — ABNORMAL HIGH (ref 70–99)
Glucose-Capillary: 122 mg/dL — ABNORMAL HIGH (ref 70–99)
Glucose-Capillary: 148 mg/dL — ABNORMAL HIGH (ref 70–99)

## 2024-03-12 LAB — MAGNESIUM: Magnesium: 2.4 mg/dL (ref 1.7–2.4)

## 2024-03-12 LAB — PHOSPHORUS: Phosphorus: 2.5 mg/dL (ref 2.5–4.6)

## 2024-03-12 MED ORDER — METOLAZONE 5 MG PO TABS
5.0000 mg | ORAL_TABLET | Freq: Every day | ORAL | Status: DC
  Administered 2024-03-12: 5 mg via ORAL
  Filled 2024-03-12: qty 1

## 2024-03-12 MED ORDER — LACTATED RINGERS IV BOLUS
500.0000 mL | Freq: Once | INTRAVENOUS | Status: AC
  Administered 2024-03-12: 500 mL via INTRAVENOUS

## 2024-03-12 MED ORDER — MIDAZOLAM HCL 2 MG/2ML IJ SOLN
2.0000 mg | INTRAMUSCULAR | Status: DC | PRN
  Administered 2024-03-12 – 2024-03-14 (×9): 4 mg via INTRAVENOUS
  Administered 2024-03-14: 2 mg via INTRAVENOUS
  Administered 2024-03-14: 4 mg via INTRAVENOUS
  Administered 2024-03-14: 2 mg via INTRAVENOUS
  Administered 2024-03-14 – 2024-03-15 (×3): 4 mg via INTRAVENOUS
  Administered 2024-03-16 (×2): 2 mg via INTRAVENOUS
  Administered 2024-03-17 – 2024-03-20 (×4): 4 mg via INTRAVENOUS
  Filled 2024-03-12 (×17): qty 4
  Filled 2024-03-12: qty 2
  Filled 2024-03-12 (×2): qty 4

## 2024-03-12 MED ORDER — POTASSIUM CHLORIDE 10 MEQ/100ML IV SOLN
10.0000 meq | INTRAVENOUS | Status: AC
  Administered 2024-03-12 (×4): 10 meq via INTRAVENOUS
  Filled 2024-03-12 (×4): qty 100

## 2024-03-12 MED ORDER — HYDRALAZINE HCL 20 MG/ML IJ SOLN: 10.0000 mg | Freq: Three times a day (TID) | INTRAMUSCULAR | Status: DC | PRN

## 2024-03-12 MED ORDER — BANATROL TF EN LIQD
60.0000 mL | Freq: Two times a day (BID) | ENTERAL | Status: DC
  Administered 2024-03-12 – 2024-03-14 (×5): 60 mL
  Filled 2024-03-12 (×5): qty 60

## 2024-03-12 MED ORDER — METOLAZONE 5 MG PO TABS: 5.0000 mg | ORAL_TABLET | Freq: Every day | ORAL | Status: DC

## 2024-03-12 MED ORDER — FUROSEMIDE 10 MG/ML IJ SOLN
40.0000 mg | Freq: Every day | INTRAMUSCULAR | Status: DC
  Administered 2024-03-12: 40 mg via INTRAVENOUS
  Filled 2024-03-12: qty 4

## 2024-03-12 MED ORDER — DEXMEDETOMIDINE HCL IN NACL 400 MCG/100ML IV SOLN
0.0000 ug/kg/h | INTRAVENOUS | Status: DC
  Administered 2024-03-12: 0.7 ug/kg/h via INTRAVENOUS
  Administered 2024-03-12 (×2): 0.4 ug/kg/h via INTRAVENOUS
  Administered 2024-03-13: 0.7 ug/kg/h via INTRAVENOUS
  Administered 2024-03-13: 1.4 ug/kg/h via INTRAVENOUS
  Administered 2024-03-13: 1.2 ug/kg/h via INTRAVENOUS
  Administered 2024-03-13: 1 ug/kg/h via INTRAVENOUS
  Administered 2024-03-14 (×2): 2 ug/kg/h via INTRAVENOUS
  Administered 2024-03-14: 1.5 ug/kg/h via INTRAVENOUS
  Administered 2024-03-14 (×3): 2 ug/kg/h via INTRAVENOUS
  Administered 2024-03-14: 1.2 ug/kg/h via INTRAVENOUS
  Administered 2024-03-14: 1.5 ug/kg/h via INTRAVENOUS
  Administered 2024-03-14 (×2): 1.2 ug/kg/h via INTRAVENOUS
  Administered 2024-03-15: 2 ug/kg/h via INTRAVENOUS
  Administered 2024-03-15: 1.9 ug/kg/h via INTRAVENOUS
  Administered 2024-03-15: 2 ug/kg/h via INTRAVENOUS
  Administered 2024-03-15 (×6): 1.9 ug/kg/h via INTRAVENOUS
  Administered 2024-03-15: 2 ug/kg/h via INTRAVENOUS
  Administered 2024-03-15: 1.9 ug/kg/h via INTRAVENOUS
  Administered 2024-03-15: 2 ug/kg/h via INTRAVENOUS
  Administered 2024-03-16: 1.9 ug/kg/h via INTRAVENOUS
  Administered 2024-03-16: 1.8 ug/kg/h via INTRAVENOUS
  Administered 2024-03-16: 1.9 ug/kg/h via INTRAVENOUS
  Administered 2024-03-16: 1.6 ug/kg/h via INTRAVENOUS
  Administered 2024-03-16: 1.5 ug/kg/h via INTRAVENOUS
  Administered 2024-03-16: 1.6 ug/kg/h via INTRAVENOUS
  Administered 2024-03-16: 1.5 ug/kg/h via INTRAVENOUS
  Administered 2024-03-16 (×2): 1.9 ug/kg/h via INTRAVENOUS
  Administered 2024-03-16: 1.5 ug/kg/h via INTRAVENOUS
  Administered 2024-03-16: 1.6 ug/kg/h via INTRAVENOUS
  Administered 2024-03-17: 1.5 ug/kg/h via INTRAVENOUS
  Administered 2024-03-17 (×2): 1.4 ug/kg/h via INTRAVENOUS
  Administered 2024-03-17 – 2024-03-18 (×8): 1.5 ug/kg/h via INTRAVENOUS
  Administered 2024-03-18: 0.9 ug/kg/h via INTRAVENOUS
  Administered 2024-03-18 (×3): 1.5 ug/kg/h via INTRAVENOUS
  Administered 2024-03-18: 0.5 ug/kg/h via INTRAVENOUS
  Administered 2024-03-18 – 2024-03-19 (×2): 0.9 ug/kg/h via INTRAVENOUS
  Administered 2024-03-19: 1 ug/kg/h via INTRAVENOUS
  Administered 2024-03-19: 1.1 ug/kg/h via INTRAVENOUS
  Administered 2024-03-19: 0.8 ug/kg/h via INTRAVENOUS
  Administered 2024-03-19: 1 ug/kg/h via INTRAVENOUS
  Administered 2024-03-19 (×2): 1.1 ug/kg/h via INTRAVENOUS
  Administered 2024-03-20 (×4): 1 ug/kg/h via INTRAVENOUS
  Administered 2024-03-20 (×2): 1.2 ug/kg/h via INTRAVENOUS
  Administered 2024-03-20: 1 ug/kg/h via INTRAVENOUS
  Administered 2024-03-21: 1.1 ug/kg/h via INTRAVENOUS
  Administered 2024-03-21 (×3): 1 ug/kg/h via INTRAVENOUS
  Administered 2024-03-21: 0.7 ug/kg/h via INTRAVENOUS
  Administered 2024-03-21: 0.8 ug/kg/h via INTRAVENOUS
  Administered 2024-03-21: 1.1 ug/kg/h via INTRAVENOUS
  Administered 2024-03-22: 0.8 ug/kg/h via INTRAVENOUS
  Administered 2024-03-22: 0.3 ug/kg/h via INTRAVENOUS
  Administered 2024-03-23: 0.5 ug/kg/h via INTRAVENOUS
  Administered 2024-03-23: 0.8 ug/kg/h via INTRAVENOUS
  Filled 2024-03-12: qty 200
  Filled 2024-03-12 (×5): qty 100
  Filled 2024-03-12 (×2): qty 200
  Filled 2024-03-12 (×4): qty 100
  Filled 2024-03-12 (×2): qty 200
  Filled 2024-03-12: qty 100
  Filled 2024-03-12 (×2): qty 200
  Filled 2024-03-12: qty 100
  Filled 2024-03-12: qty 200
  Filled 2024-03-12: qty 100
  Filled 2024-03-12: qty 200
  Filled 2024-03-12 (×4): qty 100
  Filled 2024-03-12 (×2): qty 400
  Filled 2024-03-12: qty 100
  Filled 2024-03-12 (×2): qty 200
  Filled 2024-03-12 (×2): qty 100
  Filled 2024-03-12: qty 200
  Filled 2024-03-12 (×10): qty 100
  Filled 2024-03-12 (×2): qty 200
  Filled 2024-03-12 (×5): qty 100
  Filled 2024-03-12 (×2): qty 200
  Filled 2024-03-12: qty 100
  Filled 2024-03-12 (×3): qty 200
  Filled 2024-03-12 (×2): qty 100
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
## 2024-03-13 DIAGNOSIS — S27329A Contusion of lung, unspecified, initial encounter: Secondary | ICD-10-CM | POA: Diagnosis not present

## 2024-03-13 DIAGNOSIS — J9601 Acute respiratory failure with hypoxia: Secondary | ICD-10-CM | POA: Diagnosis not present

## 2024-03-13 DIAGNOSIS — J15211 Pneumonia due to Methicillin susceptible Staphylococcus aureus: Secondary | ICD-10-CM | POA: Diagnosis not present

## 2024-03-13 DIAGNOSIS — G9340 Encephalopathy, unspecified: Secondary | ICD-10-CM

## 2024-03-13 DIAGNOSIS — J9602 Acute respiratory failure with hypercapnia: Secondary | ICD-10-CM | POA: Diagnosis not present

## 2024-03-13 LAB — GLUCOSE, CAPILLARY
Glucose-Capillary: 113 mg/dL — ABNORMAL HIGH (ref 70–99)
Glucose-Capillary: 133 mg/dL — ABNORMAL HIGH (ref 70–99)
Glucose-Capillary: 181 mg/dL — ABNORMAL HIGH (ref 70–99)
Glucose-Capillary: 122 mg/dL — ABNORMAL HIGH (ref 70–99)
Glucose-Capillary: 142 mg/dL — ABNORMAL HIGH (ref 70–99)
Glucose-Capillary: 141 mg/dL — ABNORMAL HIGH (ref 70–99)

## 2024-03-13 LAB — CBC
HCT: 31.5 % — ABNORMAL LOW (ref 39.0–52.0)
Hemoglobin: 10.3 g/dL — ABNORMAL LOW (ref 13.0–17.0)
MCH: 32.6 pg (ref 26.0–34.0)
MCHC: 32.7 g/dL (ref 30.0–36.0)
MCV: 99.7 fL (ref 80.0–100.0)
Platelets: 953 10*3/uL (ref 150–400)
RBC: 3.16 MIL/uL — ABNORMAL LOW (ref 4.22–5.81)
RDW: 15.1 % (ref 11.5–15.5)
WBC: 14.6 10*3/uL — ABNORMAL HIGH (ref 4.0–10.5)
nRBC: 1.9 % — ABNORMAL HIGH (ref 0.0–0.2)

## 2024-03-13 LAB — BASIC METABOLIC PANEL WITH GFR
Anion gap: 12 (ref 5–15)
BUN: 42 mg/dL — ABNORMAL HIGH (ref 6–20)
CO2: 29 mmol/L (ref 22–32)
Calcium: 8.3 mg/dL — ABNORMAL LOW (ref 8.9–10.3)
Chloride: 102 mmol/L (ref 98–111)
Creatinine, Ser: 0.89 mg/dL (ref 0.61–1.24)
GFR, Estimated: >60 mL/min (ref >60)
Glucose, Bld: 114 mg/dL — ABNORMAL HIGH (ref 70–99)
Potassium: 3.3 mmol/L — ABNORMAL LOW (ref 3.5–5.1)
Sodium: 143 mmol/L (ref 135–145)

## 2024-03-13 MED ORDER — POTASSIUM CHLORIDE 20 MEQ PO PACK: 40.0000 meq | PACK | Freq: Two times a day (BID) | ORAL | Status: DC

## 2024-03-13 MED ORDER — BETHANECHOL CHLORIDE 10 MG PO TABS
5.0000 mg | ORAL_TABLET | Freq: Three times a day (TID) | ORAL | Status: DC
  Administered 2024-03-13 – 2024-03-27 (×41): 5 mg
  Filled 2024-03-13 (×41): qty 1

## 2024-03-13 MED ORDER — POTASSIUM CHLORIDE 20 MEQ PO PACK
40.0000 meq | PACK | Freq: Two times a day (BID) | ORAL | Status: AC
  Administered 2024-03-13 (×2): 40 meq
  Filled 2024-03-13 (×2): qty 2
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
## 2024-03-13 NOTE — Plan of Care (Signed)
 Patient required increased sedation during the night for vent compliance.

## 2024-03-13 NOTE — Progress Notes (Signed)
 eLink Physician-Brief Progress Note Patient Name: Hunter Cook DOB: Jan 05, 1996 MRN: [id number]   Date of Service  03/13/2024  HPI/Events of Note  patient had 4 large bowel movements overnight causing agitation and skin redness  Trauma services primary, no obvious colonic injury, no obvious rectal injury.  No obvious coagulopathy  eICU Interventions  Defer decision to place fecal management system to surgical team     Intervention Category Minor Interventions: Routine modifications to care plan (e.g. PRN medications for pain, fever)  Darlin Stenseth 03/13/2024, 6:28 AM
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
## 2024-03-14 DIAGNOSIS — J15211 Pneumonia due to Methicillin susceptible Staphylococcus aureus: Secondary | ICD-10-CM | POA: Diagnosis not present

## 2024-03-14 DIAGNOSIS — T1490XA Injury, unspecified, initial encounter: Secondary | ICD-10-CM | POA: Diagnosis not present

## 2024-03-14 DIAGNOSIS — S27329A Contusion of lung, unspecified, initial encounter: Secondary | ICD-10-CM | POA: Diagnosis not present

## 2024-03-14 DIAGNOSIS — J9601 Acute respiratory failure with hypoxia: Secondary | ICD-10-CM | POA: Diagnosis not present

## 2024-03-14 LAB — BASIC METABOLIC PANEL WITH GFR
Anion gap: 11 (ref 5–15)
BUN: 33 mg/dL — ABNORMAL HIGH (ref 6–20)
CO2: 26 mmol/L (ref 22–32)
Calcium: 7.8 mg/dL — ABNORMAL LOW (ref 8.9–10.3)
Chloride: 101 mmol/L (ref 98–111)
Creatinine, Ser: 0.66 mg/dL (ref 0.61–1.24)
GFR, Estimated: >60 mL/min (ref >60)
Glucose, Bld: 137 mg/dL — ABNORMAL HIGH (ref 70–99)
Potassium: 3.6 mmol/L (ref 3.5–5.1)
Sodium: 138 mmol/L (ref 135–145)

## 2024-03-14 LAB — GLUCOSE, CAPILLARY
Glucose-Capillary: 127 mg/dL — ABNORMAL HIGH (ref 70–99)
Glucose-Capillary: 131 mg/dL — ABNORMAL HIGH (ref 70–99)
Glucose-Capillary: 105 mg/dL — ABNORMAL HIGH (ref 70–99)
Glucose-Capillary: 120 mg/dL — ABNORMAL HIGH (ref 70–99)
Glucose-Capillary: 111 mg/dL — ABNORMAL HIGH (ref 70–99)
Glucose-Capillary: 123 mg/dL — ABNORMAL HIGH (ref 70–99)

## 2024-03-14 MED ORDER — FREE WATER
200.0000 mL | Status: DC
  Administered 2024-03-14 – 2024-03-18 (×19): 200 mL

## 2024-03-14 MED ORDER — BANATROL TF EN LIQD
60.0000 mL | Freq: Three times a day (TID) | ENTERAL | Status: DC
  Administered 2024-03-14 – 2024-04-07 (×72): 60 mL
  Filled 2024-03-14 (×71): qty 60
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
## 2024-03-14 NOTE — Progress Notes (Signed)
 Pt's wife, Vinessa called for an update.  Spoke about importance of possibly needing a trach to progress him with therapies and to help decrease sedation.  Pt's wife verbalized understanding if this is what is best for him.
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
## 2024-03-14 NOTE — Plan of Care (Signed)
 Patient unable to comply with ventilator without increase and maintenance of sedation on night shift. Tube feeds stopped this morning per provider order after patient vomited x3.

## 2024-03-15 ENCOUNTER — Inpatient Hospital Stay (HOSPITAL_COMMUNITY)

## 2024-03-15 DIAGNOSIS — S27329A Contusion of lung, unspecified, initial encounter: Secondary | ICD-10-CM | POA: Diagnosis not present

## 2024-03-15 DIAGNOSIS — J15211 Pneumonia due to Methicillin susceptible Staphylococcus aureus: Secondary | ICD-10-CM | POA: Diagnosis not present

## 2024-03-15 DIAGNOSIS — T1490XA Injury, unspecified, initial encounter: Secondary | ICD-10-CM | POA: Diagnosis not present

## 2024-03-15 DIAGNOSIS — J9601 Acute respiratory failure with hypoxia: Secondary | ICD-10-CM | POA: Diagnosis not present

## 2024-03-15 LAB — CBC WITH DIFFERENTIAL/PLATELET
Abs Immature Granulocytes: 0.12 10*3/uL — ABNORMAL HIGH (ref 0.00–0.07)
Basophils Absolute: 0.2 10*3/uL — ABNORMAL HIGH (ref 0.0–0.1)
Basophils Relative: 1 %
Eosinophils Absolute: 0.7 10*3/uL — ABNORMAL HIGH (ref 0.0–0.5)
Eosinophils Relative: 6 %
HCT: 31.5 % — ABNORMAL LOW (ref 39.0–52.0)
Hemoglobin: 10 g/dL — ABNORMAL LOW (ref 13.0–17.0)
Immature Granulocytes: 1 %
Lymphocytes Relative: 19 %
Lymphs Abs: 2.3 10*3/uL (ref 0.7–4.0)
MCH: 32.4 pg (ref 26.0–34.0)
MCHC: 31.7 g/dL (ref 30.0–36.0)
MCV: 101.9 fL — ABNORMAL HIGH (ref 80.0–100.0)
Monocytes Absolute: 1 10*3/uL (ref 0.1–1.0)
Monocytes Relative: 8 %
Neutro Abs: 7.8 10*3/uL — ABNORMAL HIGH (ref 1.7–7.7)
Neutrophils Relative %: 65 %
Platelets: 915 10*3/uL (ref 150–400)
RBC: 3.09 MIL/uL — ABNORMAL LOW (ref 4.22–5.81)
RDW: 14.5 % (ref 11.5–15.5)
WBC: 12 10*3/uL — ABNORMAL HIGH (ref 4.0–10.5)
nRBC: 0.3 % — ABNORMAL HIGH (ref 0.0–0.2)

## 2024-03-15 LAB — BASIC METABOLIC PANEL WITH GFR
Anion gap: 9 (ref 5–15)
BUN: 22 mg/dL — ABNORMAL HIGH (ref 6–20)
CO2: 26 mmol/L (ref 22–32)
Calcium: 7.9 mg/dL — ABNORMAL LOW (ref 8.9–10.3)
Chloride: 103 mmol/L (ref 98–111)
Creatinine, Ser: 0.55 mg/dL — ABNORMAL LOW (ref 0.61–1.24)
GFR, Estimated: >60 mL/min (ref >60)
Glucose, Bld: 119 mg/dL — ABNORMAL HIGH (ref 70–99)
Potassium: 4 mmol/L (ref 3.5–5.1)
Sodium: 138 mmol/L (ref 135–145)

## 2024-03-15 LAB — GLUCOSE, CAPILLARY
Glucose-Capillary: 108 mg/dL — ABNORMAL HIGH (ref 70–99)
Glucose-Capillary: 133 mg/dL — ABNORMAL HIGH (ref 70–99)
Glucose-Capillary: 126 mg/dL — ABNORMAL HIGH (ref 70–99)
Glucose-Capillary: 126 mg/dL — ABNORMAL HIGH (ref 70–99)
Glucose-Capillary: 126 mg/dL — ABNORMAL HIGH (ref 70–99)

## 2024-03-15 MED ORDER — VITAL AF 1.2 CAL PO LIQD
1000.0000 mL | ORAL | Status: DC
  Administered 2024-03-15 – 2024-03-16 (×2): 1000 mL

## 2024-03-15 MED ORDER — QUETIAPINE FUMARATE 100 MG PO TABS
100.0000 mg | ORAL_TABLET | Freq: Two times a day (BID) | ORAL | Status: DC
  Administered 2024-03-15 – 2024-03-30 (×30): 100 mg
  Filled 2024-03-15 (×30): qty 1

## 2024-03-15 MED ORDER — PROSOURCE TF20 ENFIT COMPATIBL EN LIQD
60.0000 mL | Freq: Every day | ENTERAL | Status: DC
  Administered 2024-03-15 – 2024-03-16 (×2): 60 mL
  Filled 2024-03-15 (×2): qty 60
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
## 2024-03-16 ENCOUNTER — Inpatient Hospital Stay (HOSPITAL_COMMUNITY)

## 2024-03-16 LAB — GLUCOSE, CAPILLARY
Glucose-Capillary: 125 mg/dL — ABNORMAL HIGH (ref 70–99)
Glucose-Capillary: 110 mg/dL — ABNORMAL HIGH (ref 70–99)
Glucose-Capillary: 115 mg/dL — ABNORMAL HIGH (ref 70–99)
Glucose-Capillary: 112 mg/dL — ABNORMAL HIGH (ref 70–99)
Glucose-Capillary: 128 mg/dL — ABNORMAL HIGH (ref 70–99)
Glucose-Capillary: 106 mg/dL — ABNORMAL HIGH (ref 70–99)
Glucose-Capillary: 113 mg/dL — ABNORMAL HIGH (ref 70–99)

## 2024-03-16 MED ORDER — VITAL AF 1.2 CAL PO LIQD
1000.0000 mL | ORAL | Status: DC
  Administered 2024-03-16 – 2024-04-06 (×24): 1000 mL
  Filled 2024-03-16 (×8): qty 1000

## 2024-03-16 MED ORDER — FENTANYL CITRATE (PF) 250 MCG/5ML IJ SOLN
INTRAMUSCULAR | Status: AC
  Filled 2024-03-16: qty 5

## 2024-03-16 MED ORDER — MIDAZOLAM HCL 2 MG/2ML IJ SOLN
INTRAMUSCULAR | Status: AC
  Filled 2024-03-16: qty 2

## 2024-03-16 NOTE — Anesthesia Preprocedure Evaluation (Addendum)
 Anesthesia Evaluation  Patient identified by MRN, date of birth, ID band Patient unresponsive    Reviewed: Unable to perform ROS - Chart review onlyPreop documentation limited or incomplete due to emergent nature of procedure.  Airway Mallampati: Intubated       Dental   Pulmonary  Acute hypercarbic/hypoxic respiratory failure requiring mechanical ventilation    + decreased breath sounds      Cardiovascular  Rhythm:Regular  Anterior mediastinal hematoma, pneumomediastinum; normal cardiac function on recent TTE   Neuro/Psych  PSYCHIATRIC DISORDERS Anxiety Depression Bipolar Disorder   C/f ligamentous injury at craniovertebral junction. Remains in C-collar    GI/Hepatic ,,,(+)     substance abuse  alcohol use  Endo/Other    Renal/GU      Musculoskeletal Multiple orthopedic injuries including mandibular fx   Abdominal   Peds  Hematology  (+) Blood dyscrasia, anemia Thrombocytosis  Lab Results      Component                Value               Date                      WBC                      14.2 (H)            03/17/2024                HGB                      10.7 (L)            03/17/2024                HCT                      33.8 (L)            03/17/2024                MCV                      100.6 (H)           03/17/2024                PLT                      1,004 (HH)          03/17/2024              Anesthesia Other Findings   Reproductive/Obstetrics                             Anesthesia Physical Anesthesia Plan  ASA: 4  Anesthesia Plan: General   Post-op Pain Management:    Induction: Inhalational  PONV Risk Score and Plan: 2 and Ondansetron   Airway Management Planned: Oral ETT and Tracheostomy  Additional Equipment:   Intra-op Plan:   Post-operative Plan: Post-operative intubation/ventilation  Informed Consent:      History available from chart only and  Only emergency history available  Plan Discussed with: CRNA and Surgeon  Anesthesia Plan Comments:         Anesthesia Quick Evaluation
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
## 2024-03-17 ENCOUNTER — Inpatient Hospital Stay (HOSPITAL_COMMUNITY): Admitting: Anesthesiology

## 2024-03-17 ENCOUNTER — Inpatient Hospital Stay (HOSPITAL_COMMUNITY)

## 2024-03-17 ENCOUNTER — Other Ambulatory Visit: Payer: Self-pay

## 2024-03-17 ENCOUNTER — Encounter (HOSPITAL_COMMUNITY): Admission: EM | Disposition: A | Discharge status: Home / Self Care | Payer: Self-pay | Source: Home / Self Care

## 2024-03-17 DIAGNOSIS — Z9911 Dependence on respirator [ventilator] status: Secondary | ICD-10-CM

## 2024-03-17 HISTORY — PX: TRACHEOSTOMY TUBE PLACEMENT: SHX814

## 2024-03-17 LAB — BASIC METABOLIC PANEL WITH GFR
Anion gap: 9 (ref 5–15)
BUN: 20 mg/dL (ref 6–20)
CO2: 28 mmol/L (ref 22–32)
Calcium: 8.1 mg/dL — ABNORMAL LOW (ref 8.9–10.3)
Chloride: 100 mmol/L (ref 98–111)
Creatinine, Ser: 0.57 mg/dL — ABNORMAL LOW (ref 0.61–1.24)
GFR, Estimated: >60 mL/min (ref >60)
Glucose, Bld: 126 mg/dL — ABNORMAL HIGH (ref 70–99)
Potassium: 4.7 mmol/L (ref 3.5–5.1)
Sodium: 137 mmol/L (ref 135–145)

## 2024-03-17 LAB — CBC
HCT: 33.8 % — ABNORMAL LOW (ref 39.0–52.0)
Hemoglobin: 10.7 g/dL — ABNORMAL LOW (ref 13.0–17.0)
MCH: 31.8 pg (ref 26.0–34.0)
MCHC: 31.7 g/dL (ref 30.0–36.0)
MCV: 100.6 fL — ABNORMAL HIGH (ref 80.0–100.0)
Platelets: 1004 10*3/uL (ref 150–400)
RBC: 3.36 MIL/uL — ABNORMAL LOW (ref 4.22–5.81)
RDW: 14.5 % (ref 11.5–15.5)
WBC: 14.2 10*3/uL — ABNORMAL HIGH (ref 4.0–10.5)
nRBC: 0 % (ref 0.0–0.2)

## 2024-03-17 LAB — GLUCOSE, CAPILLARY
Glucose-Capillary: 102 mg/dL — ABNORMAL HIGH (ref 70–99)
Glucose-Capillary: 102 mg/dL — ABNORMAL HIGH (ref 70–99)
Glucose-Capillary: 107 mg/dL — ABNORMAL HIGH (ref 70–99)
Glucose-Capillary: 124 mg/dL — ABNORMAL HIGH (ref 70–99)
Glucose-Capillary: 91 mg/dL (ref 70–99)
Glucose-Capillary: 93 mg/dL (ref 70–99)

## 2024-03-17 LAB — SURGICAL PCR SCREEN
MRSA, PCR: NEGATIVE
Staphylococcus aureus: POSITIVE — AB

## 2024-03-17 SURGERY — CREATION, TRACHEOSTOMY
Anesthesia: General

## 2024-03-17 MED ORDER — PROPOFOL 10 MG/ML IV BOLUS
INTRAVENOUS | Status: DC | PRN
  Administered 2024-03-17: 20 mg via INTRAVENOUS
  Administered 2024-03-17: 30 mg via INTRAVENOUS

## 2024-03-17 MED ORDER — ROCURONIUM BROMIDE 10 MG/ML (PF) SYRINGE
PREFILLED_SYRINGE | INTRAVENOUS | Status: AC
  Filled 2024-03-17: qty 10

## 2024-03-17 MED ORDER — 0.9 % SODIUM CHLORIDE (POUR BTL) OPTIME
TOPICAL | Status: DC | PRN
  Administered 2024-03-17: 1000 mL

## 2024-03-17 MED ORDER — LACTATED RINGERS IV SOLN: INTRAVENOUS | Status: DC | PRN

## 2024-03-17 MED ORDER — LIDOCAINE-EPINEPHRINE 1.5 %-1:200,000 OPTIME - NO CHARGE: INTRAMUSCULAR | Status: DC | PRN

## 2024-03-17 MED ORDER — FENTANYL CITRATE (PF) 250 MCG/5ML IJ SOLN
INTRAMUSCULAR | Status: AC
  Filled 2024-03-17: qty 5

## 2024-03-17 MED ORDER — MIDAZOLAM HCL 2 MG/2ML IJ SOLN
INTRAMUSCULAR | Status: AC
  Filled 2024-03-17: qty 2

## 2024-03-17 MED ORDER — PHENYLEPHRINE 80 MCG/ML (10ML) SYRINGE FOR IV PUSH (FOR BLOOD PRESSURE SUPPORT)
PREFILLED_SYRINGE | INTRAVENOUS | Status: DC | PRN
  Administered 2024-03-17: 160 ug via INTRAVENOUS
  Administered 2024-03-17: 240 ug via INTRAVENOUS

## 2024-03-17 MED ORDER — MUPIROCIN 2 % EX OINT
1.0000 | TOPICAL_OINTMENT | Freq: Two times a day (BID) | CUTANEOUS | Status: AC
  Administered 2024-03-17 – 2024-03-21 (×10): 1 via NASAL
  Filled 2024-03-17: qty 22

## 2024-03-17 MED ORDER — ROCURONIUM BROMIDE 10 MG/ML (PF) SYRINGE
PREFILLED_SYRINGE | INTRAVENOUS | Status: DC | PRN
  Administered 2024-03-17: 30 mg via INTRAVENOUS
  Administered 2024-03-17: 20 mg via INTRAVENOUS
  Administered 2024-03-17: 80 mg via INTRAVENOUS
  Administered 2024-03-17: 40 mg via INTRAVENOUS
  Administered 2024-03-17: 30 mg via INTRAVENOUS
  Administered 2024-03-17: 50 mg via INTRAVENOUS

## 2024-03-17 MED ORDER — PROPOFOL 10 MG/ML IV BOLUS
INTRAVENOUS | Status: AC
  Filled 2024-03-17: qty 20

## 2024-03-17 MED ORDER — LIDOCAINE 2% (20 MG/ML) 5 ML SYRINGE
INTRAMUSCULAR | Status: AC
  Filled 2024-03-17: qty 5

## 2024-03-17 MED ORDER — FENTANYL CITRATE (PF) 250 MCG/5ML IJ SOLN
INTRAMUSCULAR | Status: AC
Start: 2024-03-17 — End: ?
  Filled 2024-03-17: qty 5

## 2024-03-17 SURGICAL SUPPLY — 33 items
BAG COUNTER SPONGE SURGICOUNT (BAG) ×1 IMPLANT
BLADE CLIPPER SURG (BLADE) IMPLANT
CANISTER SUCT 3000ML PPV (MISCELLANEOUS) ×1 IMPLANT
COVER SURGICAL LIGHT HANDLE (MISCELLANEOUS) ×1 IMPLANT
DRAPE UTILITY XL STRL (DRAPES) ×1 IMPLANT
DRESSING MEPILEX FLEX 4X4 (GAUZE/BANDAGES/DRESSINGS) IMPLANT
DRSG IV TEGADERM 3.5X4.5 STRL (GAUZE/BANDAGES/DRESSINGS) IMPLANT
DRSG TEGADERM 4X4.75 (GAUZE/BANDAGES/DRESSINGS) ×1 IMPLANT
DRSG TELFA 3X8 NADH STRL (GAUZE/BANDAGES/DRESSINGS) ×1 IMPLANT
ELECT CAUTERY BLADE 6.4 (BLADE) ×1 IMPLANT
ELECTRODE REM PT RTRN 9FT ADLT (ELECTROSURGICAL) ×1 IMPLANT
GAUZE 4X4 16PLY ~~LOC~~+RFID DBL (SPONGE) ×1 IMPLANT
GLOVE BIO SURGEON STRL SZ 6.5 (GLOVE) ×1 IMPLANT
GLOVE BIOGEL PI IND STRL 6 (GLOVE) ×1 IMPLANT
GOWN STRL REUS W/ TWL LRG LVL3 (GOWN DISPOSABLE) ×2 IMPLANT
KIT BASIN OR (CUSTOM PROCEDURE TRAY) ×1 IMPLANT
KIT TURNOVER KIT B (KITS) ×1 IMPLANT
NS IRRIG 1000ML POUR BTL (IV SOLUTION) ×1 IMPLANT
PACK EENT II TURBAN DRAPE (CUSTOM PROCEDURE TRAY) ×1 IMPLANT
PAD ARMBOARD POSITIONER FOAM (MISCELLANEOUS) ×2 IMPLANT
PENCIL BUTTON HOLSTER BLD 10FT (ELECTRODE) ×1 IMPLANT
SPONGE INTESTINAL PEANUT (DISPOSABLE) ×1 IMPLANT
SUT SILK 2 0 SH (SUTURE) IMPLANT
SUT VIC AB 2-0 SH 27XBRD (SUTURE) IMPLANT
SUT VICRYL AB 3 0 TIES (SUTURE) ×1 IMPLANT
SYR 20ML LL LF (SYRINGE) ×1 IMPLANT
TOWEL GREEN STERILE (TOWEL DISPOSABLE) ×1 IMPLANT
TOWEL GREEN STERILE FF (TOWEL DISPOSABLE) ×1 IMPLANT
TRAY TRACH DILATOR BL RHINO G2 (MISCELLANEOUS) IMPLANT
TRAY W/TRACH TUBE 7.5 (MISCELLANEOUS) IMPLANT
TRAY W/TRACH TUBE 8.5 (MISCELLANEOUS) IMPLANT
TUBE CONNECTING 12X1/4 (SUCTIONS) ×1 IMPLANT
TUBE TRACH 6.0 CUFF FLEX (MISCELLANEOUS) IMPLANT

## 2024-03-17 NOTE — Anesthesia Postprocedure Evaluation (Signed)
 Anesthesia Post Note  Patient: Hunter Cook  Procedure(s) Performed: CREATION, TRACHEOSTOMY     Patient location during evaluation: SICU Anesthesia Type: General Level of consciousness: sedated Pain management: pain level controlled Vital Signs Assessment: post-procedure vital signs reviewed and stable Respiratory status: new trach in place. Cardiovascular status: stable Postop Assessment: no apparent nausea or vomiting Anesthetic complications: no   No notable events documented.  Last Vitals:  Vitals:   03/17/24 1126 03/17/24 1414  BP: 102/62   Pulse:  89  Resp:  (!) 22  Temp:    SpO2:  95%    Last Pain:  Vitals:   03/17/24 0800  TempSrc: Axillary  PainSc:                  [name]

## 2024-03-17 NOTE — Progress Notes (Signed)
 Patient seen and examined. Plan trach today. Consent obtained 4/21.   Anda Bamberg, MD General and Trauma Surgery [facility] Surgery

## 2024-03-17 NOTE — Transfer of Care (Signed)
 Immediate Anesthesia Transfer of Care Note  Patient: Hunter Cook  Procedure(s) Performed: CREATION, TRACHEOSTOMY  Patient Location: PACU and ICU  Anesthesia Type:General  Level of Consciousness: sedated  Airway & Oxygen Therapy: Patient Spontanous Breathing and Patient placed on Ventilator (see vital sign flow sheet for setting)  Post-op Assessment: Report given to RN and Post -op Vital signs reviewed and stable  Post vital signs: Reviewed and stable  Last Vitals:  Vitals Value Taken Time  BP    Temp    Pulse 111 03/17/24 1418  Resp 19 03/17/24 1418  SpO2 95 % 03/17/24 1418  Vitals shown include unfiled device data. On arrival to ICU 144/88, 88HR, 99%sat, ventilated Last Pain:  Vitals:   03/17/24 0800  TempSrc: Axillary  PainSc:          Complications: No notable events documented.
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
## 2024-03-17 NOTE — Progress Notes (Signed)
 Pt transported from OR on ventilator to 4N20 without any complications.

## 2024-03-17 NOTE — Op Note (Signed)
   Operative Note  Date: 03/17/2024  Procedure: percutaneous tracheostomy with bronchoscopic assistance  Pre-op diagnosis: prolonged mechanical ventilation Post-op diagnosis: same  Indication and clinical history: The patient  is a 28 y.o. year old male with prolonged mechanical ventilation  Surgeon: Anda Bamberg, MD  Anesthesia: General  Findings:  Specimen: none EBL: <5cc  Drains/Implants: #6 Shiley cuffed  tracheostomy tube  Disposition: ICU  Description of procedure: The patient was positioned supine with a shoulder roll. Time-out was performed verifying correct patient, procedure, signature of informed consent, and pre-operative antibiotics as indicated. Anesthetic induction was uneventful and the patient was confirmed to be on 100% FiO2. The neck was prepped and draped in the usual sterile fashion. Palpation of neck anatomy was performed. A longitudinal incision was made in the neck and deepened down to the trachea.   Due to abrupt desaturations and in the interest of a more expedient procedure, a bronchoscope was introduced into the endotracheal tube and positioned at the distal tip of the tube. The pilot balloon was deflated and the bronchoscope and endotracheal tube slowly retracted proximally until the bronchoscopic light was visualized just below the cricoid cartilage and the endotracheal tube was palpated just below the cricoid cartilage. An introducer needle was inserted between the second and third tracheal rings under bronchoscopic visualization. A guidewire was passed through the introducer needle and the needle removed. Serial dilation was performed and a #6   cuffed Shiley and stylet were inserted over the guidewire and the guidewire and stylet removed. The inner cannula was inserted, the pilot balloon on the tracheostomy inflated, and the ventilator tubing disconnected from the endotracheal tube and connected to the tracheostomy. Chest rise, appropriate end-tidal CO2,  and return tidal volumes were confirmed. Bronchoscopic evaluation was performed and confirmed adequate tracheostomy positioning above the carina. The tracheostomy tube was sutured in four quadrants and a tracheostomy tie applied to the neck. The orotracheal tube was removed. All sponge and instrument counts were correct at the conclusion of the procedure. The patient tolerated the procedure well. There were no complications. Post-procedure chest x-ray was ordered to confirm tube position and the absence of a pneumothorax.   Anda Bamberg, MD General and Trauma Surgery [facility] Surgery

## 2024-03-18 ENCOUNTER — Inpatient Hospital Stay (HOSPITAL_COMMUNITY)

## 2024-03-18 ENCOUNTER — Encounter (HOSPITAL_COMMUNITY): Payer: Self-pay | Admitting: Surgery

## 2024-03-18 LAB — CBC
HCT: 33.5 % — ABNORMAL LOW (ref 39.0–52.0)
Hemoglobin: 10.7 g/dL — ABNORMAL LOW (ref 13.0–17.0)
MCH: 31.6 pg (ref 26.0–34.0)
MCHC: 31.9 g/dL (ref 30.0–36.0)
MCV: 98.8 fL (ref 80.0–100.0)
Platelets: 794 10*3/uL — ABNORMAL HIGH (ref 150–400)
RBC: 3.39 MIL/uL — ABNORMAL LOW (ref 4.22–5.81)
RDW: 14.4 % (ref 11.5–15.5)
WBC: 14.8 10*3/uL — ABNORMAL HIGH (ref 4.0–10.5)
nRBC: 0 % (ref 0.0–0.2)

## 2024-03-18 LAB — BASIC METABOLIC PANEL WITH GFR
Anion gap: 11 (ref 5–15)
BUN: 17 mg/dL (ref 6–20)
CO2: 27 mmol/L (ref 22–32)
Calcium: 8.7 mg/dL — ABNORMAL LOW (ref 8.9–10.3)
Chloride: 100 mmol/L (ref 98–111)
Creatinine, Ser: 0.46 mg/dL — ABNORMAL LOW (ref 0.61–1.24)
GFR, Estimated: >60 mL/min (ref >60)
Glucose, Bld: 136 mg/dL — ABNORMAL HIGH (ref 70–99)
Potassium: 3.9 mmol/L (ref 3.5–5.1)
Sodium: 138 mmol/L (ref 135–145)

## 2024-03-18 LAB — GLUCOSE, CAPILLARY
Glucose-Capillary: 105 mg/dL — ABNORMAL HIGH (ref 70–99)
Glucose-Capillary: 100 mg/dL — ABNORMAL HIGH (ref 70–99)
Glucose-Capillary: 146 mg/dL — ABNORMAL HIGH (ref 70–99)
Glucose-Capillary: 126 mg/dL — ABNORMAL HIGH (ref 70–99)
Glucose-Capillary: 120 mg/dL — ABNORMAL HIGH (ref 70–99)
Glucose-Capillary: 102 mg/dL — ABNORMAL HIGH (ref 70–99)

## 2024-03-18 MED ORDER — FREE WATER
100.0000 mL | Status: DC
  Administered 2024-03-18 – 2024-03-19 (×7): 100 mL

## 2024-03-18 MED ORDER — ALTEPLASE 2 MG IJ SOLR
2.0000 mg | Freq: Once | INTRAMUSCULAR | Status: AC
  Administered 2024-03-18: 2 mg
  Filled 2024-03-18: qty 2

## 2024-03-18 MED ORDER — OXYCODONE HCL 5 MG PO TABS
10.0000 mg | ORAL_TABLET | ORAL | Status: DC | PRN
  Administered 2024-03-20 – 2024-03-21 (×3): 10 mg
  Administered 2024-03-23 – 2024-03-24 (×3): 15 mg
  Administered 2024-03-28 – 2024-03-29 (×2): 10 mg
  Administered 2024-03-29 – 2024-04-05 (×3): 15 mg
  Filled 2024-03-18 (×2): qty 3
  Filled 2024-03-18: qty 2
  Filled 2024-03-18 (×2): qty 3
  Filled 2024-03-18 (×4): qty 2
  Filled 2024-03-18 (×3): qty 3

## 2024-03-18 NOTE — Progress Notes (Signed)
 RT NOTE: patient placed on CPAP/PSV of 12/5 at 0853.  Tolerating well at this time.  Will continue to monitor and wean as tolerated.
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
## 2024-03-19 LAB — CBC
HCT: 31.1 % — ABNORMAL LOW (ref 39.0–52.0)
Hemoglobin: 9.8 g/dL — ABNORMAL LOW (ref 13.0–17.0)
MCH: 31.4 pg (ref 26.0–34.0)
MCHC: 31.5 g/dL (ref 30.0–36.0)
MCV: 99.7 fL (ref 80.0–100.0)
Platelets: 736 10*3/uL — ABNORMAL HIGH (ref 150–400)
RBC: 3.12 MIL/uL — ABNORMAL LOW (ref 4.22–5.81)
RDW: 14.5 % (ref 11.5–15.5)
WBC: 15.3 10*3/uL — ABNORMAL HIGH (ref 4.0–10.5)
nRBC: 0 % (ref 0.0–0.2)

## 2024-03-19 LAB — BASIC METABOLIC PANEL WITH GFR
Anion gap: 10 (ref 5–15)
BUN: 15 mg/dL (ref 6–20)
CO2: 27 mmol/L (ref 22–32)
Calcium: 8.6 mg/dL — ABNORMAL LOW (ref 8.9–10.3)
Chloride: 102 mmol/L (ref 98–111)
Creatinine, Ser: 0.37 mg/dL — ABNORMAL LOW (ref 0.61–1.24)
GFR, Estimated: >60 mL/min (ref >60)
Glucose, Bld: 113 mg/dL — ABNORMAL HIGH (ref 70–99)
Potassium: 3.6 mmol/L (ref 3.5–5.1)
Sodium: 139 mmol/L (ref 135–145)

## 2024-03-19 LAB — GLUCOSE, CAPILLARY
Glucose-Capillary: 118 mg/dL — ABNORMAL HIGH (ref 70–99)
Glucose-Capillary: 115 mg/dL — ABNORMAL HIGH (ref 70–99)
Glucose-Capillary: 118 mg/dL — ABNORMAL HIGH (ref 70–99)
Glucose-Capillary: 133 mg/dL — ABNORMAL HIGH (ref 70–99)
Glucose-Capillary: 133 mg/dL — ABNORMAL HIGH (ref 70–99)
Glucose-Capillary: 128 mg/dL — ABNORMAL HIGH (ref 70–99)

## 2024-03-19 NOTE — Progress Notes (Signed)
 Foley catheter d/c at 0600 on 4/23.  This RN I&O pt at 2200 for the 3rd time.  From 2200- 0400, pt has spontaneously voided 450 mL.  Bladder scanned pt and it showed 679 mL in bladder.  Called Trauma MD to ask about placing foley vs. I&O again.  Per Dr. Melton Squires, I&O pt.
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
## 2024-03-20 LAB — BASIC METABOLIC PANEL WITH GFR
Anion gap: 11 (ref 5–15)
BUN: 15 mg/dL (ref 6–20)
CO2: 29 mmol/L (ref 22–32)
Calcium: 8.9 mg/dL (ref 8.9–10.3)
Chloride: 99 mmol/L (ref 98–111)
Creatinine, Ser: 0.49 mg/dL — ABNORMAL LOW (ref 0.61–1.24)
GFR, Estimated: >60 mL/min (ref >60)
Glucose, Bld: 110 mg/dL — ABNORMAL HIGH (ref 70–99)
Potassium: 3.8 mmol/L (ref 3.5–5.1)
Sodium: 139 mmol/L (ref 135–145)

## 2024-03-20 LAB — GLUCOSE, CAPILLARY
Glucose-Capillary: 103 mg/dL — ABNORMAL HIGH (ref 70–99)
Glucose-Capillary: 105 mg/dL — ABNORMAL HIGH (ref 70–99)
Glucose-Capillary: 115 mg/dL — ABNORMAL HIGH (ref 70–99)
Glucose-Capillary: 120 mg/dL — ABNORMAL HIGH (ref 70–99)
Glucose-Capillary: 121 mg/dL — ABNORMAL HIGH (ref 70–99)
Glucose-Capillary: 123 mg/dL — ABNORMAL HIGH (ref 70–99)

## 2024-03-20 LAB — CBC
HCT: 34.3 % — ABNORMAL LOW (ref 39.0–52.0)
Hemoglobin: 10.9 g/dL — ABNORMAL LOW (ref 13.0–17.0)
MCH: 31.2 pg (ref 26.0–34.0)
MCHC: 31.8 g/dL (ref 30.0–36.0)
MCV: 98.3 fL (ref 80.0–100.0)
Platelets: 850 10*3/uL — ABNORMAL HIGH (ref 150–400)
RBC: 3.49 MIL/uL — ABNORMAL LOW (ref 4.22–5.81)
RDW: 14.3 % (ref 11.5–15.5)
WBC: 15 10*3/uL — ABNORMAL HIGH (ref 4.0–10.5)
nRBC: 0 % (ref 0.0–0.2)

## 2024-03-20 LAB — VALPROIC ACID LEVEL: Valproic Acid Lvl: 23 ug/mL — ABNORMAL LOW (ref 50–100)

## 2024-03-20 MED ORDER — MIDAZOLAM HCL 2 MG/2ML IJ SOLN
2.0000 mg | Freq: Four times a day (QID) | INTRAMUSCULAR | Status: DC | PRN
  Administered 2024-03-20 – 2024-03-28 (×8): 2 mg via INTRAVENOUS
  Filled 2024-03-20 (×8): qty 2

## 2024-03-20 MED ORDER — CLONIDINE HCL 0.2 MG PO TABS
0.2000 mg | ORAL_TABLET | Freq: Four times a day (QID) | ORAL | Status: DC
  Administered 2024-03-20 – 2024-03-22 (×10): 0.2 mg
  Filled 2024-03-20 (×10): qty 1

## 2024-03-20 MED ORDER — VALPROIC ACID 250 MG/5ML PO SOLN
750.0000 mg | Freq: Three times a day (TID) | ORAL | Status: DC
  Administered 2024-03-20 – 2024-03-26 (×18): 750 mg
  Filled 2024-03-20 (×18): qty 15

## 2024-03-20 MED ORDER — HYDROMORPHONE HCL 1 MG/ML IJ SOLN
1.0000 mg | INTRAMUSCULAR | Status: DC | PRN
  Administered 2024-03-20 – 2024-03-25 (×8): 1 mg via INTRAVENOUS
  Filled 2024-03-20 (×8): qty 1

## 2024-03-20 NOTE — Progress Notes (Signed)
 Went to do q 6hr I&O and noticed pt had spontaneously voided 700 mL.  Preformed bladder scan to ensure no residual, bladder scan read 0 mL.  Did not I&O pt.

## 2024-03-20 NOTE — Progress Notes (Signed)
 Attempted to get morning labs from PICC.  All lumens flush great but do not have blood return.  Notified phlebotomy to draw labs.
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
## 2024-03-20 NOTE — Progress Notes (Signed)
 SLP Cancellation Note  Patient Details Name: Hunter Cook MRN: [id number] DOB: 1996/10/13   Cancelled treatment:       Reason Eval/Treat Not Completed: Fatigue/lethargy limiting ability to participate. Attempted to arouse patient for eval without success. RN in room and reports that patient is either very lethargic/largely unarousable or restless and able to follow commands. Will f/u next date.   Delsa Fife MA, CCC-SLP    Teagyn Fishel Meryl 03/20/2024, 9:19 AM

## 2024-03-21 LAB — BASIC METABOLIC PANEL WITH GFR
Anion gap: 11 (ref 5–15)
BUN: 16 mg/dL (ref 6–20)
CO2: 29 mmol/L (ref 22–32)
Calcium: 9 mg/dL (ref 8.9–10.3)
Chloride: 100 mmol/L (ref 98–111)
Creatinine, Ser: 0.46 mg/dL — ABNORMAL LOW (ref 0.61–1.24)
GFR, Estimated: >60 mL/min (ref >60)
Glucose, Bld: 116 mg/dL — ABNORMAL HIGH (ref 70–99)
Potassium: 4.1 mmol/L (ref 3.5–5.1)
Sodium: 140 mmol/L (ref 135–145)

## 2024-03-21 LAB — CBC
HCT: 32.7 % — ABNORMAL LOW (ref 39.0–52.0)
Hemoglobin: 10.7 g/dL — ABNORMAL LOW (ref 13.0–17.0)
MCH: 31.9 pg (ref 26.0–34.0)
MCHC: 32.7 g/dL (ref 30.0–36.0)
MCV: 97.6 fL (ref 80.0–100.0)
Platelets: 780 10*3/uL — ABNORMAL HIGH (ref 150–400)
RBC: 3.35 MIL/uL — ABNORMAL LOW (ref 4.22–5.81)
RDW: 14 % (ref 11.5–15.5)
WBC: 17 10*3/uL — ABNORMAL HIGH (ref 4.0–10.5)
nRBC: 0 % (ref 0.0–0.2)

## 2024-03-21 LAB — GLUCOSE, CAPILLARY
Glucose-Capillary: 143 mg/dL — ABNORMAL HIGH (ref 70–99)
Glucose-Capillary: 120 mg/dL — ABNORMAL HIGH (ref 70–99)
Glucose-Capillary: 112 mg/dL — ABNORMAL HIGH (ref 70–99)
Glucose-Capillary: 120 mg/dL — ABNORMAL HIGH (ref 70–99)
Glucose-Capillary: 119 mg/dL — ABNORMAL HIGH (ref 70–99)
Glucose-Capillary: 118 mg/dL — ABNORMAL HIGH (ref 70–99)
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
## 2024-03-22 LAB — GLUCOSE, CAPILLARY
Glucose-Capillary: 109 mg/dL — ABNORMAL HIGH (ref 70–99)
Glucose-Capillary: 120 mg/dL — ABNORMAL HIGH (ref 70–99)
Glucose-Capillary: 118 mg/dL — ABNORMAL HIGH (ref 70–99)
Glucose-Capillary: 108 mg/dL — ABNORMAL HIGH (ref 70–99)
Glucose-Capillary: 112 mg/dL — ABNORMAL HIGH (ref 70–99)
Glucose-Capillary: 115 mg/dL — ABNORMAL HIGH (ref 70–99)

## 2024-03-22 MED ORDER — CLONIDINE HCL 0.2 MG PO TABS
0.2000 mg | ORAL_TABLET | Freq: Four times a day (QID) | ORAL | Status: DC
  Administered 2024-03-22 – 2024-03-23 (×3): 0.2 mg
  Filled 2024-03-22 (×3): qty 1

## 2024-03-22 MED ORDER — CLONIDINE HCL 0.1 MG PO TABS: 0.1000 mg | ORAL_TABLET | Freq: Two times a day (BID) | ORAL | Status: DC

## 2024-03-22 MED ORDER — CLONIDINE HCL 0.1 MG PO TABS: 0.1000 mg | ORAL_TABLET | ORAL | Status: DC

## 2024-03-22 MED ORDER — BUSPIRONE HCL 5 MG PO TABS
7.5000 mg | ORAL_TABLET | Freq: Two times a day (BID) | ORAL | Status: DC
  Administered 2024-03-22 – 2024-04-07 (×33): 7.5 mg
  Filled 2024-03-22: qty 1
  Filled 2024-03-22 (×6): qty 2
  Filled 2024-03-22: qty 1.5
  Filled 2024-03-22: qty 2
  Filled 2024-03-22: qty 1
  Filled 2024-03-22 (×10): qty 2
  Filled 2024-03-22: qty 1
  Filled 2024-03-22 (×5): qty 2
  Filled 2024-03-22 (×2): qty 1
  Filled 2024-03-22 (×4): qty 2
  Filled 2024-03-22: qty 1
  Filled 2024-03-22: qty 2

## 2024-03-22 MED ORDER — CLONIDINE HCL 0.1 MG PO TABS
0.1000 mg | ORAL_TABLET | Freq: Four times a day (QID) | ORAL | Status: DC
Start: 2024-03-23 — End: 2024-03-22
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
## 2024-03-22 NOTE — Progress Notes (Signed)

## 2024-03-23 LAB — CBC
HCT: 33.9 % — ABNORMAL LOW (ref 39.0–52.0)
Hemoglobin: 11.1 g/dL — ABNORMAL LOW (ref 13.0–17.0)
MCH: 31.2 pg (ref 26.0–34.0)
MCHC: 32.7 g/dL (ref 30.0–36.0)
MCV: 95.2 fL (ref 80.0–100.0)
Platelets: 808 10*3/uL — ABNORMAL HIGH (ref 150–400)
RBC: 3.56 MIL/uL — ABNORMAL LOW (ref 4.22–5.81)
RDW: 13.8 % (ref 11.5–15.5)
WBC: 13.5 10*3/uL — ABNORMAL HIGH (ref 4.0–10.5)
nRBC: 0 % (ref 0.0–0.2)

## 2024-03-23 LAB — GLUCOSE, CAPILLARY
Glucose-Capillary: 104 mg/dL — ABNORMAL HIGH (ref 70–99)
Glucose-Capillary: 130 mg/dL — ABNORMAL HIGH (ref 70–99)
Glucose-Capillary: 95 mg/dL (ref 70–99)
Glucose-Capillary: 119 mg/dL — ABNORMAL HIGH (ref 70–99)
Glucose-Capillary: 115 mg/dL — ABNORMAL HIGH (ref 70–99)
Glucose-Capillary: 128 mg/dL — ABNORMAL HIGH (ref 70–99)

## 2024-03-23 LAB — BASIC METABOLIC PANEL WITH GFR
Anion gap: 10 (ref 5–15)
BUN: 17 mg/dL (ref 6–20)
CO2: 27 mmol/L (ref 22–32)
Calcium: 9.2 mg/dL (ref 8.9–10.3)
Chloride: 101 mmol/L (ref 98–111)
Creatinine, Ser: 0.48 mg/dL — ABNORMAL LOW (ref 0.61–1.24)
GFR, Estimated: >60 mL/min (ref >60)
Glucose, Bld: 115 mg/dL — ABNORMAL HIGH (ref 70–99)
Potassium: 3.8 mmol/L (ref 3.5–5.1)
Sodium: 138 mmol/L (ref 135–145)

## 2024-03-23 MED ORDER — CLONIDINE HCL 0.2 MG PO TABS
0.3000 mg | ORAL_TABLET | Freq: Four times a day (QID) | ORAL | Status: DC
  Administered 2024-03-23 – 2024-03-24 (×4): 0.3 mg
  Filled 2024-03-23 (×4): qty 1
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
## 2024-03-24 LAB — GLUCOSE, CAPILLARY
Glucose-Capillary: 86 mg/dL (ref 70–99)
Glucose-Capillary: 86 mg/dL (ref 70–99)
Glucose-Capillary: 116 mg/dL — ABNORMAL HIGH (ref 70–99)
Glucose-Capillary: 110 mg/dL — ABNORMAL HIGH (ref 70–99)
Glucose-Capillary: 101 mg/dL — ABNORMAL HIGH (ref 70–99)
Glucose-Capillary: 94 mg/dL (ref 70–99)

## 2024-03-24 MED ORDER — OXYCODONE HCL 5 MG PO TABS
10.0000 mg | ORAL_TABLET | ORAL | Status: DC
  Administered 2024-03-24 – 2024-04-07 (×77): 10 mg
  Filled 2024-03-24 (×77): qty 2

## 2024-03-24 MED ORDER — CLONIDINE HCL 0.1 MG PO TABS: 0.1000 mg | ORAL_TABLET | Freq: Every day | ORAL | Status: DC

## 2024-03-24 MED ORDER — CLONIDINE HCL 0.1 MG PO TABS
0.2000 mg | ORAL_TABLET | Freq: Four times a day (QID) | ORAL | Status: AC
  Administered 2024-03-24 – 2024-03-25 (×4): 0.2 mg
  Filled 2024-03-24 (×4): qty 2

## 2024-03-24 MED ORDER — CLONIDINE HCL 0.2 MG PO TABS
0.3000 mg | ORAL_TABLET | Freq: Four times a day (QID) | ORAL | Status: AC
  Administered 2024-03-24 (×2): 0.3 mg
  Filled 2024-03-24: qty 1

## 2024-03-24 MED ORDER — DIAZEPAM 5 MG PO TABS
17.5000 mg | ORAL_TABLET | Freq: Four times a day (QID) | ORAL | Status: DC
  Administered 2024-03-24 – 2024-03-30 (×22): 17.5 mg
  Filled 2024-03-24 (×22): qty 4

## 2024-03-24 MED ORDER — CLONIDINE HCL 0.2 MG PO TABS: 0.2000 mg | ORAL_TABLET | Freq: Four times a day (QID) | ORAL | Status: DC

## 2024-03-24 MED ORDER — CLONIDINE HCL 0.1 MG PO TABS: 0.1000 mg | ORAL_TABLET | Freq: Two times a day (BID) | ORAL | Status: DC

## 2024-03-24 MED ORDER — CLONIDINE HCL 0.2 MG PO TABS
0.3000 mg | ORAL_TABLET | Freq: Four times a day (QID) | ORAL | Status: DC
  Filled 2024-03-24: qty 1

## 2024-03-24 MED ORDER — CLONIDINE HCL 0.1 MG PO TABS: 0.1000 mg | ORAL_TABLET | Freq: Four times a day (QID) | ORAL | Status: DC

## 2024-03-24 MED ORDER — CLONIDINE HCL 0.1 MG PO TABS
0.1000 mg | ORAL_TABLET | Freq: Every day | ORAL | Status: AC
  Administered 2024-03-28: 0.1 mg via ORAL
  Filled 2024-03-24: qty 1

## 2024-03-24 MED ORDER — CLONIDINE HCL 0.2 MG PO TABS: 0.3000 mg | ORAL_TABLET | Freq: Four times a day (QID) | ORAL | Status: DC

## 2024-03-24 MED ORDER — CLONIDINE HCL 0.1 MG PO TABS
0.1000 mg | ORAL_TABLET | Freq: Four times a day (QID) | ORAL | Status: AC
  Administered 2024-03-25 – 2024-03-26 (×4): 0.1 mg
  Filled 2024-03-24 (×4): qty 1

## 2024-03-24 MED ORDER — CLONIDINE HCL 0.1 MG PO TABS
0.1000 mg | ORAL_TABLET | Freq: Two times a day (BID) | ORAL | Status: AC
  Administered 2024-03-26 – 2024-03-27 (×2): 0.1 mg via ORAL
  Filled 2024-03-24 (×2): qty 1

## 2024-03-24 MED ORDER — CLONIDINE HCL 0.2 MG PO TABS
0.2000 mg | ORAL_TABLET | Freq: Four times a day (QID) | ORAL | Status: DC
Start: 2024-03-24 — End: 2024-03-24
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
## 2024-03-24 NOTE — Plan of Care (Signed)
  Problem: Clinical Measurements: Goal: Ability to maintain clinical measurements within normal limits will improve Outcome: Progressing Goal: Respiratory complications will improve Outcome: Progressing Goal: Cardiovascular complication will be avoided Outcome: Progressing   Problem: Activity: Goal: Risk for activity intolerance will decrease Outcome: Progressing   Problem: Nutrition: Goal: Adequate nutrition will be maintained Outcome: Progressing   Problem: Coping: Goal: Level of anxiety will decrease Outcome: Progressing   Problem: Pain Managment: Goal: General experience of comfort will improve and/or be controlled Outcome: Progressing   Problem: Safety: Goal: Ability to remain free from injury will improve Outcome: Progressing   Problem: Skin Integrity: Goal: Risk for impaired skin integrity will decrease Outcome: Progressing   Problem: Elimination: Goal: Will not experience complications related to urinary retention Outcome: Not Progressing

## 2024-03-24 NOTE — Plan of Care (Signed)
  Problem: Clinical Measurements: Goal: Ability to maintain clinical measurements within normal limits will improve Outcome: Progressing Goal: Will remain free from infection Outcome: Progressing Goal: Diagnostic test results will improve Outcome: Progressing Goal: Respiratory complications will improve Outcome: Progressing   Problem: Activity: Goal: Risk for activity intolerance will decrease Outcome: Progressing   Problem: Nutrition: Goal: Adequate nutrition will be maintained Outcome: Progressing   Problem: Safety: Goal: Ability to remain free from injury will improve Outcome: Progressing   Problem: Skin Integrity: Goal: Risk for impaired skin integrity will decrease Outcome: Progressing   Problem: Metabolic: Goal: Ability to maintain appropriate glucose levels will improve Outcome: Progressing   Problem: Nutritional: Goal: Maintenance of adequate nutrition will improve Outcome: Progressing Goal: Progress toward achieving an optimal weight will improve Outcome: Progressing   Problem: Skin Integrity: Goal: Risk for impaired skin integrity will decrease Outcome: Progressing   Problem: Tissue Perfusion: Goal: Adequacy of tissue perfusion will improve Outcome: Progressing   Problem: Education: Goal: Knowledge of General Education information will improve Description: Including pain rating scale, medication(s)/side effects and non-pharmacologic comfort measures Outcome: Not Progressing   Problem: Health Behavior/Discharge Planning: Goal: Ability to manage health-related needs will improve Outcome: Not Progressing   Problem: Clinical Measurements: Goal: Cardiovascular complication will be avoided Outcome: Not Progressing   Problem: Coping: Goal: Level of anxiety will decrease Outcome: Not Progressing   Problem: Elimination: Goal: Will not experience complications related to bowel motility Outcome: Not Progressing Goal: Will not experience complications  related to urinary retention Outcome: Not Progressing   Problem: Pain Managment: Goal: General experience of comfort will improve and/or be controlled Outcome: Not Progressing   Problem: Education: Goal: Ability to describe self-care measures that may prevent or decrease complications (Diabetes Survival Skills Education) will improve Outcome: Not Progressing Goal: Individualized Educational Video(s) Outcome: Not Progressing   Problem: Coping: Goal: Ability to adjust to condition or change in health will improve Outcome: Not Progressing   Problem: Fluid Volume: Goal: Ability to maintain a balanced intake and output will improve Outcome: Not Progressing   Problem: Health Behavior/Discharge Planning: Goal: Ability to identify and utilize available resources and services will improve Outcome: Not Progressing Goal: Ability to manage health-related needs will improve Outcome: Not Progressing
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
## 2024-03-25 LAB — GLUCOSE, CAPILLARY
Glucose-Capillary: 98 mg/dL (ref 70–99)
Glucose-Capillary: 114 mg/dL — ABNORMAL HIGH (ref 70–99)
Glucose-Capillary: 116 mg/dL — ABNORMAL HIGH (ref 70–99)
Glucose-Capillary: 129 mg/dL — ABNORMAL HIGH (ref 70–99)
Glucose-Capillary: 122 mg/dL — ABNORMAL HIGH (ref 70–99)
Glucose-Capillary: 100 mg/dL — ABNORMAL HIGH (ref 70–99)

## 2024-03-25 LAB — VALPROIC ACID LEVEL: Valproic Acid Lvl: 43 ug/mL — ABNORMAL LOW (ref 50–100)

## 2024-03-25 MED ORDER — METOPROLOL TARTRATE 50 MG PO TABS
75.0000 mg | ORAL_TABLET | Freq: Two times a day (BID) | ORAL | Status: DC
  Administered 2024-03-25 – 2024-03-27 (×5): 75 mg
  Filled 2024-03-25 (×5): qty 1
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
## 2024-03-25 NOTE — Progress Notes (Signed)
 Patient urinated 350 and wet the bed. Bladder scan- 215.  Dr. Aniceto Barley notified. Verbal order to not in & out patient if bladder scan less than 300.

## 2024-03-25 NOTE — Progress Notes (Signed)
 Pt transferred to 4NP13 at this time.
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
## 2024-03-25 NOTE — TOC Progression Note (Addendum)
 Transition of Care [facility]) - Progression Note    Patient Details  Name: Hunter Cook MRN: [id number] Date of Birth: 07-22-1996  Transition of [facility]) CM/SW Contact  Valery Gaucher, [place] Phone Number: 03/25/2024, 12:06 PM  Clinical Narrative:     TOC continues to follow -  per chart review, Patient does not appears to be an appropriate candidate for CIR.  TOC will send out SNF referrals once patient is closer to being medically stable and trach is cuffless.   Liddie Reel, MSW, LCSW Clinical Social Worker    Expected Discharge Plan: Skilled Nursing Facility Barriers to Discharge: Continued Medical Work up  Expected Discharge Plan and Services                                               Social Determinants of Health (SDOH) Interventions SDOH Screenings   Food Insecurity: Patient Unable To Answer (03/03/2024)  Housing: Patient Unable To Answer (03/11/2024)  Transportation Needs: Patient Unable To Answer (03/03/2024)  Utilities: Patient Unable To Answer (03/03/2024)    Readmission Risk Interventions     No data to display

## 2024-03-25 NOTE — Progress Notes (Signed)
 Patient HR currently in the 130s. HR did get up to 140s. MD paged.

## 2024-03-25 NOTE — Procedures (Signed)
 Pt's trach sutures removed per Dr. Raye Cai. Trach site cleaned and redressed and changed inner cannula. No complications noted, RN assisted at bedside.
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
## 2024-03-25 NOTE — Care Management Important Message (Signed)
 Important Message  Patient Details  Name: Hunter Cook MRN: [id number] Date of Birth: [birth year]   Important Message Given:  Yes - Medicare IM     Felix Host 03/25/2024, 12:33 PM

## 2024-03-25 NOTE — Plan of Care (Signed)

## 2024-03-26 ENCOUNTER — Telehealth (INDEPENDENT_AMBULATORY_CARE_PROVIDER_SITE_OTHER): Payer: Self-pay | Admitting: Physician Assistant

## 2024-03-26 LAB — GLUCOSE, CAPILLARY
Glucose-Capillary: 114 mg/dL — ABNORMAL HIGH (ref 70–99)
Glucose-Capillary: 129 mg/dL — ABNORMAL HIGH (ref 70–99)
Glucose-Capillary: 134 mg/dL — ABNORMAL HIGH (ref 70–99)
Glucose-Capillary: 136 mg/dL — ABNORMAL HIGH (ref 70–99)
Glucose-Capillary: 137 mg/dL — ABNORMAL HIGH (ref 70–99)
Glucose-Capillary: 98 mg/dL (ref 70–99)

## 2024-03-26 MED ORDER — VALPROIC ACID 250 MG/5ML PO SOLN
1000.0000 mg | Freq: Three times a day (TID) | ORAL | Status: DC
  Administered 2024-03-26 – 2024-03-31 (×15): 1000 mg
  Filled 2024-03-26 (×15): qty 20

## 2024-03-26 NOTE — Telephone Encounter (Signed)
 Tried to call to schedule 6 wk Mandible Fx follow-up with Belma Boxer for some time around June 12th.  VM not set-up
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
## 2024-03-26 NOTE — Progress Notes (Signed)
   Inpatient Rehab Admissions Coordinator :  Per therapy noted progress/recommendations, patient was screened for CIR candidacy by Jeannetta Millman RN MSN.  At this time patient appears to be a potential candidate for CIR. I will place a rehab consult per protocol for full assessment. Please call me with any questions.  Jeannetta Millman RN MSN Admissions Coordinator [phone]

## 2024-03-26 NOTE — Progress Notes (Signed)
 Trach tube downsized from #6 cuffed trach to #4 cuffless trach tube without difficulty.  Positive color change noted on co2 detector.  Airway suctioned for copious tan/white secretions.

## 2024-03-26 NOTE — Plan of Care (Signed)
 ENT Plan of Care; Seen again and examined at bedside today. He continues to be minimally responsive to commands. Trach in place, on trach mask. He is NPO. On limited exam as patient is not cooperative, his wear facets do seem to align and I am unable to feel gross mobility of the mandible. As he is almost 4 weeks out and no plans to imminently start PO, will proceed with non-op management of his angle fracture especially as given his mental status, he is at risk of aspiration. Will schedule follow up in 4 weeks.  Page ENT with any questions or should his mental status improve. He is to be on a strict liquid only diet for another 4 weeks.   Kadyn Chovan B Laiana Fratus
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
## 2024-03-27 ENCOUNTER — Inpatient Hospital Stay (HOSPITAL_COMMUNITY)

## 2024-03-27 LAB — GLUCOSE, CAPILLARY
Glucose-Capillary: 119 mg/dL — ABNORMAL HIGH (ref 70–99)
Glucose-Capillary: 135 mg/dL — ABNORMAL HIGH (ref 70–99)
Glucose-Capillary: 113 mg/dL — ABNORMAL HIGH (ref 70–99)
Glucose-Capillary: 134 mg/dL — ABNORMAL HIGH (ref 70–99)
Glucose-Capillary: 100 mg/dL — ABNORMAL HIGH (ref 70–99)

## 2024-03-27 NOTE — Progress Notes (Signed)
   03/27/24 0743  Vitals  Temp 98.3 F (36.8 C)  Temp Source Axillary  BP (!) 146/106  MAP (mmHg) 115  BP Location Left Arm  BP Method Automatic  Patient Position (if appropriate) Lying  Pulse Rate (!) 119  Pulse Rate Source Monitor  ECG Heart Rate (!) 120  Resp 18  Level of Consciousness  Level of Consciousness Alert  MEWS COLOR  MEWS Score Color Yellow  Oxygen Therapy  SpO2 90 %  O2 Device Tracheostomy Collar  O2 Flow Rate (L/min) 6 L/min  Pain Assessment  Pain Scale PAINAD  PAINAD (Pain Assessment in Advanced Dementia)  Breathing 0  Negative Vocalization 0  Facial Expression 0  Body Language 0  Consolability 0  PAINAD Score 0  Critical Care Pain Observation Tool (CPOT)  Facial Expression 0  Body Movements 0  Muscle Tension 0  Compliance with ventilator (intubated pts.) 0  Vocalization (extubated pts.) 0  CPOT Total 0  MEWS Score  MEWS Temp 0  MEWS Systolic 0  MEWS Pulse 2  MEWS RR 0  MEWS LOC 0  MEWS Score 2   Pt desating into mid 80s, pt does not appear to be in distress. RN unable to pass suction catheter in trach and meeting resistance. Respiratory therapist called. RT at bedside.

## 2024-03-27 NOTE — Progress Notes (Signed)
 1200- Pt desating into mid 80s. Pt does not appear to be in distress. RN unable to advance suction catheter in trach and meeting resistance. RT called. RT at bedside.  1220- New orders received for trach removal. RT at bedside.
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
## 2024-03-27 NOTE — Progress Notes (Signed)
 Pt decannulated per order.  Placed on 4L O2 via [place].

## 2024-03-27 NOTE — Progress Notes (Signed)
 Inpatient Rehab Admissions Coordinator:   Stopped by to see patient.  Bilateral soft wrist restraints and posey belt with RUE mitt.  Cortrak in place.  Pt is alert, R gaze preference and does not cross midline to verbal/tactile cues.  I called spouse to discuss caregiver support and she was at work so will call me when she has a break either today or Monday.  We will follow.   Loye Rumble, PT, DPT Admissions Coordinator [phone] 03/27/24  12:30 PM

## 2024-03-27 NOTE — Progress Notes (Signed)
 SLP Cancellation Note  Patient Details Name: Hunter Cook MRN: [id number] DOB: [birth year]   Cancelled treatment:       Reason Eval/Treat Not Completed: Fatigue/lethargy limiting ability to participate (Patient arousable but does not maintain eye opening this am. Noted trach change to 4 cuffless. Will plan to f/u on 5/5 for pmv treatment, po trials, and cognitive treatment.)  Delsa Fife MA, CCC-SLP  Katharine Rochefort Meryl 03/27/2024, 9:09 AM

## 2024-03-27 NOTE — Plan of Care (Signed)
  Problem: Elimination: Goal: Will not experience complications related to urinary retention Outcome: Progressing   Problem: Pain Managment: Goal: General experience of comfort will improve and/or be controlled Outcome: Progressing   Problem: Education: Goal: Knowledge of General Education information will improve Description: Including pain rating scale, medication(s)/side effects and non-pharmacologic comfort measures Outcome: Not Progressing   Problem: Coping: Goal: Level of anxiety will decrease Outcome: Not Progressing

## 2024-03-28 LAB — GLUCOSE, CAPILLARY
Glucose-Capillary: 105 mg/dL — ABNORMAL HIGH (ref 70–99)
Glucose-Capillary: 106 mg/dL — ABNORMAL HIGH (ref 70–99)
Glucose-Capillary: 107 mg/dL — ABNORMAL HIGH (ref 70–99)
Glucose-Capillary: 109 mg/dL — ABNORMAL HIGH (ref 70–99)
Glucose-Capillary: 116 mg/dL — ABNORMAL HIGH (ref 70–99)
Glucose-Capillary: 117 mg/dL — ABNORMAL HIGH (ref 70–99)
Glucose-Capillary: 132 mg/dL — ABNORMAL HIGH (ref 70–99)

## 2024-03-28 MED ORDER — HALOPERIDOL LACTATE 5 MG/ML IJ SOLN
5.0000 mg | Freq: Four times a day (QID) | INTRAMUSCULAR | Status: DC | PRN
  Administered 2024-04-03 – 2024-04-07 (×5): 5 mg via INTRAVENOUS
  Filled 2024-03-28 (×5): qty 1

## 2024-03-28 MED ORDER — METOPROLOL TARTRATE 50 MG PO TABS
100.0000 mg | ORAL_TABLET | Freq: Two times a day (BID) | ORAL | Status: DC
  Administered 2024-03-28 – 2024-04-08 (×23): 100 mg
  Filled 2024-03-28 (×23): qty 2

## 2024-03-28 MED ORDER — MIDAZOLAM HCL 2 MG/2ML IJ SOLN
4.0000 mg | Freq: Four times a day (QID) | INTRAMUSCULAR | Status: DC | PRN
  Administered 2024-03-28 – 2024-03-30 (×4): 4 mg via INTRAVENOUS
  Filled 2024-03-28 (×4): qty 4

## 2024-03-28 MED ORDER — MIDAZOLAM HCL 2 MG/2ML IJ SOLN
2.0000 mg | Freq: Once | INTRAMUSCULAR | Status: AC
  Administered 2024-03-28: 2 mg via INTRAVENOUS
  Filled 2024-03-28: qty 2

## 2024-03-28 NOTE — Progress Notes (Signed)
   03/28/24 0842  Vitals  Pulse Rate (!) 133  Pulse Rate Source Monitor  ECG Heart Rate (!) 133  Resp (!) 27  Level of Consciousness  Level of Consciousness Alert  MEWS COLOR  MEWS Score Color Red  Oxygen Therapy  SpO2 96 %  O2 Device Nasal Cannula  O2 Flow Rate (L/min) 4 L/min  MEWS Score  MEWS Temp 0  MEWS Systolic 0  MEWS Pulse 3  MEWS RR 2  MEWS LOC 0  MEWS Score 5   Pt screaming, cursing, and attempting to kick staff with right leg cam boot. [place] Breath, PA notified. PRN versed  administered.

## 2024-03-28 NOTE — Plan of Care (Signed)
  Problem: Nutrition: Goal: Adequate nutrition will be maintained Outcome: Progressing   Problem: Elimination: Goal: Will not experience complications related to bowel motility Outcome: Progressing   Problem: Pain Managment: Goal: General experience of comfort will improve and/or be controlled Outcome: Progressing   Problem: Fluid Volume: Goal: Ability to maintain a balanced intake and output will improve Outcome: Progressing

## 2024-03-28 NOTE — Plan of Care (Signed)
  Problem: Elimination: Goal: Will not experience complications related to urinary retention Outcome: Progressing   Problem: Pain Managment: Goal: General experience of comfort will improve and/or be controlled Outcome: Progressing
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
## 2024-03-29 LAB — GLUCOSE, CAPILLARY
Glucose-Capillary: 109 mg/dL — ABNORMAL HIGH (ref 70–99)
Glucose-Capillary: 113 mg/dL — ABNORMAL HIGH (ref 70–99)
Glucose-Capillary: 127 mg/dL — ABNORMAL HIGH (ref 70–99)
Glucose-Capillary: 129 mg/dL — ABNORMAL HIGH (ref 70–99)
Glucose-Capillary: 142 mg/dL — ABNORMAL HIGH (ref 70–99)
Glucose-Capillary: 96 mg/dL (ref 70–99)

## 2024-03-29 MED ORDER — METOPROLOL TARTRATE 5 MG/5ML IV SOLN
5.0000 mg | Freq: Four times a day (QID) | INTRAVENOUS | Status: DC | PRN
  Administered 2024-03-29 – 2024-04-01 (×3): 5 mg via INTRAVENOUS
  Filled 2024-03-29 (×2): qty 5

## 2024-03-29 MED ORDER — ORAL CARE MOUTH RINSE: 15.0000 mL | OROMUCOSAL | Status: DC | PRN

## 2024-03-29 MED ORDER — ORAL CARE MOUTH RINSE
15.0000 mL | OROMUCOSAL | Status: DC
  Administered 2024-03-29 – 2024-04-20 (×65): 15 mL via OROMUCOSAL

## 2024-03-29 MED ORDER — METOPROLOL TARTRATE 5 MG/5ML IV SOLN
INTRAVENOUS | Status: AC
Start: 2024-03-29 — End: 2024-03-30
  Filled 2024-03-29: qty 5
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
## 2024-03-29 NOTE — Plan of Care (Signed)
  Problem: Clinical Measurements: Goal: Ability to maintain clinical measurements within normal limits will improve Outcome: Progressing Goal: Will remain free from infection Outcome: Progressing Goal: Diagnostic test results will improve Outcome: Progressing Goal: Respiratory complications will improve Outcome: Progressing Goal: Cardiovascular complication will be avoided Outcome: Progressing   Problem: Nutrition: Goal: Adequate nutrition will be maintained Outcome: Progressing   Problem: Elimination: Goal: Will not experience complications related to urinary retention Outcome: Progressing   Problem: Pain Managment: Goal: General experience of comfort will improve and/or be controlled Outcome: Progressing   Problem: Safety: Goal: Ability to remain free from injury will improve Outcome: Progressing   Problem: Education: Goal: Knowledge of General Education information will improve Description: Including pain rating scale, medication(s)/side effects and non-pharmacologic comfort measures Outcome: Not Progressing   Problem: Health Behavior/Discharge Planning: Goal: Ability to manage health-related needs will improve Outcome: Not Progressing   Problem: Activity: Goal: Risk for activity intolerance will decrease Outcome: Not Progressing   Problem: Coping: Goal: Level of anxiety will decrease Outcome: Not Progressing   Problem: Elimination: Goal: Will not experience complications related to bowel motility Outcome: Not Progressing   Problem: Skin Integrity: Goal: Risk for impaired skin integrity will decrease Outcome: Not Progressing

## 2024-03-29 NOTE — Plan of Care (Signed)
  Problem: Nutrition: Goal: Adequate nutrition will be maintained Outcome: Progressing   Problem: Elimination: Goal: Will not experience complications related to bowel motility Outcome: Progressing   Problem: Tissue Perfusion: Goal: Adequacy of tissue perfusion will improve Outcome: Progressing

## 2024-03-29 NOTE — Plan of Care (Signed)
  Problem: Elimination: Goal: Will not experience complications related to urinary retention Outcome: Not Progressing Note: Bladder scan q6 hours; Straight cath x 1 during dayshift 03/29/24    Problem: Health Behavior/Discharge Planning: Goal: Ability to manage health-related needs will improve Outcome: Progressing   Problem: Clinical Measurements: Goal: Ability to maintain clinical measurements within normal limits will improve Outcome: Progressing Goal: Will remain free from infection Outcome: Progressing Goal: Diagnostic test results will improve Outcome: Progressing   Problem: Nutrition: Goal: Adequate nutrition will be maintained Outcome: Progressing   Problem: Coping: Goal: Level of anxiety will decrease Outcome: Progressing   Problem: Pain Managment: Goal: General experience of comfort will improve and/or be controlled Outcome: Progressing   Problem: Safety: Goal: Ability to remain free from injury will improve Outcome: Progressing   Problem: Skin Integrity: Goal: Risk for impaired skin integrity will decrease Outcome: Progressing   Problem: Fluid Volume: Goal: Ability to maintain a balanced intake and output will improve Outcome: Progressing   Problem: Metabolic: Goal: Ability to maintain appropriate glucose levels will improve Outcome: Progressing   Problem: Nutritional: Goal: Maintenance of adequate nutrition will improve Outcome: Progressing Goal: Progress toward achieving an optimal weight will improve Outcome: Progressing   Problem: Skin Integrity: Goal: Risk for impaired skin integrity will decrease Outcome: Progressing   Problem: Tissue Perfusion: Goal: Adequacy of tissue perfusion will improve Outcome: Progressing

## 2024-03-30 ENCOUNTER — Inpatient Hospital Stay (HOSPITAL_COMMUNITY)

## 2024-03-30 DIAGNOSIS — F419 Anxiety disorder, unspecified: Secondary | ICD-10-CM

## 2024-03-30 DIAGNOSIS — F319 Bipolar disorder, unspecified: Secondary | ICD-10-CM | POA: Diagnosis not present

## 2024-03-30 LAB — GLUCOSE, CAPILLARY
Glucose-Capillary: 113 mg/dL — ABNORMAL HIGH (ref 70–99)
Glucose-Capillary: 129 mg/dL — ABNORMAL HIGH (ref 70–99)
Glucose-Capillary: 127 mg/dL — ABNORMAL HIGH (ref 70–99)
Glucose-Capillary: 107 mg/dL — ABNORMAL HIGH (ref 70–99)
Glucose-Capillary: 126 mg/dL — ABNORMAL HIGH (ref 70–99)
Glucose-Capillary: 112 mg/dL — ABNORMAL HIGH (ref 70–99)

## 2024-03-30 MED ORDER — QUETIAPINE FUMARATE 100 MG PO TABS
200.0000 mg | ORAL_TABLET | Freq: Three times a day (TID) | ORAL | Status: DC
  Administered 2024-03-30 – 2024-04-08 (×27): 200 mg
  Filled 2024-03-30 (×28): qty 2

## 2024-03-30 MED ORDER — DIAZEPAM 5 MG PO TABS
20.0000 mg | ORAL_TABLET | Freq: Four times a day (QID) | ORAL | Status: DC
  Administered 2024-03-30 – 2024-04-01 (×9): 20 mg
  Filled 2024-03-30 (×9): qty 4
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
## 2024-03-30 NOTE — Progress Notes (Signed)
 SLP Cancellation Note  Patient Details Name: Hunter Cook MRN: [id number] DOB: 10/28/1996   Cancelled treatment:       Reason Eval/Treat Not Completed: Patient not medically ready. Recently sedated, willf/u   Chara Marquard, Hardin Leys 03/30/2024, 10:52 AM
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
## 2024-03-30 NOTE — Plan of Care (Signed)
  Problem: Clinical Measurements: Goal: Will remain free from infection Outcome: Progressing Goal: Diagnostic test results will improve Outcome: Progressing Goal: Respiratory complications will improve Outcome: Progressing Goal: Cardiovascular complication will be avoided Outcome: Progressing   Problem: Nutrition: Goal: Adequate nutrition will be maintained Outcome: Progressing   Problem: Elimination: Goal: Will not experience complications related to bowel motility Outcome: Progressing Goal: Will not experience complications related to urinary retention Outcome: Progressing   Problem: Metabolic: Goal: Ability to maintain appropriate glucose levels will improve Outcome: Progressing   Problem: Nutritional: Goal: Maintenance of adequate nutrition will improve Outcome: Progressing Goal: Progress toward achieving an optimal weight will improve Outcome: Progressing   Problem: Skin Integrity: Goal: Risk for impaired skin integrity will decrease Outcome: Progressing   Problem: Tissue Perfusion: Goal: Adequacy of tissue perfusion will improve Outcome: Progressing

## 2024-03-31 DIAGNOSIS — T1490XA Injury, unspecified, initial encounter: Secondary | ICD-10-CM | POA: Diagnosis not present

## 2024-03-31 DIAGNOSIS — R451 Restlessness and agitation: Secondary | ICD-10-CM

## 2024-03-31 LAB — COMPREHENSIVE METABOLIC PANEL WITH GFR
ALT: 14 U/L (ref 0–44)
AST: 14 U/L — ABNORMAL LOW (ref 15–41)
Albumin: 2.9 g/dL — ABNORMAL LOW (ref 3.5–5.0)
Alkaline Phosphatase: 197 U/L — ABNORMAL HIGH (ref 38–126)
Anion gap: 14 (ref 5–15)
BUN: 18 mg/dL (ref 6–20)
CO2: 31 mmol/L (ref 22–32)
Calcium: 9.2 mg/dL (ref 8.9–10.3)
Chloride: 98 mmol/L (ref 98–111)
Creatinine, Ser: 0.4 mg/dL — ABNORMAL LOW (ref 0.61–1.24)
GFR, Estimated: >60 mL/min (ref >60)
Glucose, Bld: 103 mg/dL — ABNORMAL HIGH (ref 70–99)
Potassium: 3.5 mmol/L (ref 3.5–5.1)
Sodium: 143 mmol/L (ref 135–145)
Total Bilirubin: 0.5 mg/dL (ref 0.0–1.2)
Total Protein: 7.1 g/dL (ref 6.5–8.1)

## 2024-03-31 LAB — CBC
HCT: 37.5 % — ABNORMAL LOW (ref 39.0–52.0)
Hemoglobin: 12 g/dL — ABNORMAL LOW (ref 13.0–17.0)
MCH: 31.5 pg (ref 26.0–34.0)
MCHC: 32 g/dL (ref 30.0–36.0)
MCV: 98.4 fL (ref 80.0–100.0)
Platelets: 506 10*3/uL — ABNORMAL HIGH (ref 150–400)
RBC: 3.81 MIL/uL — ABNORMAL LOW (ref 4.22–5.81)
RDW: 14.1 % (ref 11.5–15.5)
WBC: 15.7 10*3/uL — ABNORMAL HIGH (ref 4.0–10.5)
nRBC: 0 % (ref 0.0–0.2)

## 2024-03-31 LAB — GLUCOSE, CAPILLARY
Glucose-Capillary: 100 mg/dL — ABNORMAL HIGH (ref 70–99)
Glucose-Capillary: 125 mg/dL — ABNORMAL HIGH (ref 70–99)
Glucose-Capillary: 116 mg/dL — ABNORMAL HIGH (ref 70–99)
Glucose-Capillary: 136 mg/dL — ABNORMAL HIGH (ref 70–99)
Glucose-Capillary: 97 mg/dL (ref 70–99)
Glucose-Capillary: 125 mg/dL — ABNORMAL HIGH (ref 70–99)

## 2024-03-31 LAB — VALPROIC ACID LEVEL: Valproic Acid Lvl: 40 ug/mL — ABNORMAL LOW (ref 50–100)

## 2024-03-31 MED ORDER — FREE WATER
100.0000 mL | Freq: Three times a day (TID) | Status: DC
  Administered 2024-03-31 – 2024-04-14 (×36): 100 mL

## 2024-03-31 MED ORDER — MIDAZOLAM HCL 2 MG/2ML IJ SOLN
2.0000 mg | Freq: Four times a day (QID) | INTRAMUSCULAR | Status: DC | PRN
  Administered 2024-03-31 – 2024-04-20 (×18): 2 mg via INTRAVENOUS
  Filled 2024-03-31 (×18): qty 2

## 2024-03-31 MED ORDER — VALPROIC ACID 250 MG/5ML PO SOLN
1500.0000 mg | Freq: Three times a day (TID) | ORAL | Status: DC
  Administered 2024-03-31 – 2024-04-02 (×6): 1500 mg
  Filled 2024-03-31 (×13): qty 30
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
## 2024-03-31 NOTE — Plan of Care (Signed)
   Problem: Activity: Goal: Risk for activity intolerance will decrease Outcome: Progressing   Problem: Coping: Goal: Level of anxiety will decrease Outcome: Progressing   Problem: Elimination: Goal: Will not experience complications related to bowel motility Outcome: Progressing   Problem: Skin Integrity: Goal: Risk for impaired skin integrity will decrease Outcome: Progressing
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
## 2024-03-31 NOTE — Progress Notes (Addendum)
 Inpatient Rehab Admissions Coordinator:   Was able to touch base with pt's ex-spouse Vinessa.  Pt does not have any available caregiver support at discharge.  I inquired about Richarda Chance listed in chart, and Peggye Bowers states she was his step mother when he was little, but has not been involved in his life until very recently.  She does not feel it is in Zidane's best interest to consider her when discharge planning and does not feel she should be involved until [place]ban is aware and able to make the decision to include her in his care/decision making.    Loye Rumble, PT, DPT Admissions Coordinator [phone] 03/31/24  12:02 PM
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
## 2024-04-01 ENCOUNTER — Inpatient Hospital Stay (HOSPITAL_COMMUNITY)

## 2024-04-01 DIAGNOSIS — T1490XA Injury, unspecified, initial encounter: Secondary | ICD-10-CM | POA: Diagnosis not present

## 2024-04-01 LAB — VITAMIN B12: Vitamin B-12: 834 pg/mL (ref 180–914)

## 2024-04-01 LAB — URINALYSIS, ROUTINE W REFLEX MICROSCOPIC
Bilirubin Urine: NEGATIVE
Glucose, UA: NEGATIVE mg/dL
Ketones, ur: NEGATIVE mg/dL
Nitrite: POSITIVE — AB
Protein, ur: 30 mg/dL — AB
Specific Gravity, Urine: 1.02 (ref 1.005–1.030)
pH: 7 (ref 5.0–8.0)

## 2024-04-01 LAB — CBC
HCT: 37.2 % — ABNORMAL LOW (ref 39.0–52.0)
Hemoglobin: 11.8 g/dL — ABNORMAL LOW (ref 13.0–17.0)
MCH: 30.9 pg (ref 26.0–34.0)
MCHC: 31.7 g/dL (ref 30.0–36.0)
MCV: 97.4 fL (ref 80.0–100.0)
Platelets: 477 10*3/uL — ABNORMAL HIGH (ref 150–400)
RBC: 3.82 MIL/uL — ABNORMAL LOW (ref 4.22–5.81)
RDW: 14.2 % (ref 11.5–15.5)
WBC: 23.5 10*3/uL — ABNORMAL HIGH (ref 4.0–10.5)
nRBC: 0 % (ref 0.0–0.2)

## 2024-04-01 LAB — GLUCOSE, CAPILLARY
Glucose-Capillary: 105 mg/dL — ABNORMAL HIGH (ref 70–99)
Glucose-Capillary: 155 mg/dL — ABNORMAL HIGH (ref 70–99)
Glucose-Capillary: 105 mg/dL — ABNORMAL HIGH (ref 70–99)
Glucose-Capillary: 117 mg/dL — ABNORMAL HIGH (ref 70–99)
Glucose-Capillary: 95 mg/dL (ref 70–99)
Glucose-Capillary: 105 mg/dL — ABNORMAL HIGH (ref 70–99)

## 2024-04-01 LAB — URINALYSIS, MICROSCOPIC (REFLEX)

## 2024-04-01 LAB — FOLATE: Folate: 8.2 ng/mL (ref >5.9)

## 2024-04-01 LAB — MAGNESIUM: Magnesium: 1.8 mg/dL (ref 1.7–2.4)

## 2024-04-01 LAB — AMMONIA: Ammonia: 53 umol/L — ABNORMAL HIGH (ref 9–35)

## 2024-04-01 MED ORDER — FUROSEMIDE 10 MG/ML IJ SOLN
40.0000 mg | Freq: Once | INTRAMUSCULAR | Status: AC
  Administered 2024-04-01: 40 mg via INTRAVENOUS
  Filled 2024-04-01: qty 4

## 2024-04-01 MED ORDER — DIAZEPAM 5 MG PO TABS
15.0000 mg | ORAL_TABLET | Freq: Four times a day (QID) | ORAL | Status: DC
  Administered 2024-04-01 – 2024-04-06 (×19): 15 mg
  Filled 2024-04-01 (×19): qty 3

## 2024-04-01 MED ORDER — AMANTADINE HCL 100 MG PO CAPS
100.0000 mg | ORAL_CAPSULE | Freq: Every day | ORAL | Status: DC
  Administered 2024-04-01: 100 mg via ORAL
  Filled 2024-04-01 (×2): qty 1

## 2024-04-01 MED ORDER — THIAMINE HCL 100 MG/ML IJ SOLN
500.0000 mg | Freq: Three times a day (TID) | INTRAVENOUS | Status: AC
  Administered 2024-04-01 – 2024-04-04 (×9): 500 mg via INTRAVENOUS
  Filled 2024-04-01: qty 500
  Filled 2024-04-01 (×2): qty 5
  Filled 2024-04-01 (×2): qty 500
  Filled 2024-04-01: qty 3
  Filled 2024-04-01 (×3): qty 5

## 2024-04-01 MED ORDER — CIPROFLOXACIN IN D5W 400 MG/200ML IV SOLN
400.0000 mg | Freq: Two times a day (BID) | INTRAVENOUS | Status: AC
  Administered 2024-04-01 – 2024-04-06 (×10): 400 mg via INTRAVENOUS
  Filled 2024-04-01 (×10): qty 200

## 2024-04-01 MED ORDER — NYSTATIN 100000 UNIT/GM EX CREA
TOPICAL_CREAM | Freq: Two times a day (BID) | CUTANEOUS | Status: DC
  Administered 2024-04-04 – 2024-04-06 (×3): 1 via TOPICAL
  Filled 2024-04-01 (×2): qty 30
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
## 2024-04-01 NOTE — Progress Notes (Signed)
 Orthopedic Tech Progress Note Patient Details:  Hunter Cook 1996-09-12 [id number]  Per RN patient has on ASPEN COLLAR  Patient ID: Hunter Cook, male   DOB: 08-25-1996, 28 y.o.   MRN: [id number]  Hunter Cook 04/01/2024, 4:47 PM
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
## 2024-04-01 NOTE — Progress Notes (Signed)
 Orthopedic Tech Progress Note Patient Details:  Hunter Cook 1995/12/30 [id number] CAM Otho Blitz was delivered to bedside.  Patient ID: Hunter Cook, male   DOB: 1996-11-13, 28 y.o.   MRN: [id number]  Phill Brazil 04/01/2024, 8:26 AM
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
## 2024-04-02 DIAGNOSIS — T1490XA Injury, unspecified, initial encounter: Secondary | ICD-10-CM | POA: Diagnosis not present

## 2024-04-02 LAB — COMPREHENSIVE METABOLIC PANEL WITH GFR
ALT: 11 U/L (ref 0–44)
AST: 12 U/L — ABNORMAL LOW (ref 15–41)
Albumin: 2.9 g/dL — ABNORMAL LOW (ref 3.5–5.0)
Alkaline Phosphatase: 189 U/L — ABNORMAL HIGH (ref 38–126)
Anion gap: 8 (ref 5–15)
BUN: 18 mg/dL (ref 6–20)
CO2: 34 mmol/L — ABNORMAL HIGH (ref 22–32)
Calcium: 9.2 mg/dL (ref 8.9–10.3)
Chloride: 97 mmol/L — ABNORMAL LOW (ref 98–111)
Creatinine, Ser: 0.5 mg/dL — ABNORMAL LOW (ref 0.61–1.24)
GFR, Estimated: >60 mL/min (ref >60)
Glucose, Bld: 90 mg/dL (ref 70–99)
Potassium: 3.9 mmol/L (ref 3.5–5.1)
Sodium: 139 mmol/L (ref 135–145)
Total Bilirubin: 0.4 mg/dL (ref 0.0–1.2)
Total Protein: 7.6 g/dL (ref 6.5–8.1)

## 2024-04-02 LAB — GLUCOSE, CAPILLARY
Glucose-Capillary: 130 mg/dL — ABNORMAL HIGH (ref 70–99)
Glucose-Capillary: 120 mg/dL — ABNORMAL HIGH (ref 70–99)
Glucose-Capillary: 120 mg/dL — ABNORMAL HIGH (ref 70–99)
Glucose-Capillary: 149 mg/dL — ABNORMAL HIGH (ref 70–99)
Glucose-Capillary: 102 mg/dL — ABNORMAL HIGH (ref 70–99)
Glucose-Capillary: 119 mg/dL — ABNORMAL HIGH (ref 70–99)

## 2024-04-02 LAB — CBC
HCT: 36.1 % — ABNORMAL LOW (ref 39.0–52.0)
Hemoglobin: 11.6 g/dL — ABNORMAL LOW (ref 13.0–17.0)
MCH: 31.4 pg (ref 26.0–34.0)
MCHC: 32.1 g/dL (ref 30.0–36.0)
MCV: 97.8 fL (ref 80.0–100.0)
Platelets: 435 10*3/uL — ABNORMAL HIGH (ref 150–400)
RBC: 3.69 MIL/uL — ABNORMAL LOW (ref 4.22–5.81)
RDW: 14.2 % (ref 11.5–15.5)
WBC: 20.1 10*3/uL — ABNORMAL HIGH (ref 4.0–10.5)
nRBC: 0 % (ref 0.0–0.2)

## 2024-04-02 LAB — VALPROIC ACID LEVEL: Valproic Acid Lvl: 33 ug/mL — ABNORMAL LOW (ref 50–100)

## 2024-04-02 MED ORDER — AMANTADINE HCL 50 MG/5ML PO SOLN
100.0000 mg | Freq: Every day | ORAL | Status: DC
  Administered 2024-04-02 – 2024-04-07 (×6): 100 mg
  Filled 2024-04-02 (×6): qty 10

## 2024-04-02 MED ORDER — VALPROIC ACID 250 MG/5ML PO SOLN
1000.0000 mg | Freq: Three times a day (TID) | ORAL | Status: DC
  Administered 2024-04-02 – 2024-04-03 (×2): 1000 mg
  Filled 2024-04-02 (×2): qty 20

## 2024-04-02 MED ORDER — LACTULOSE 10 GM/15ML PO SOLN
10.0000 g | Freq: Every day | ORAL | Status: DC
  Administered 2024-04-02: 10 g
  Filled 2024-04-02 (×2): qty 15

## 2024-04-02 NOTE — Plan of Care (Signed)
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
## 2024-04-03 LAB — CBC
HCT: 34.8 % — ABNORMAL LOW (ref 39.0–52.0)
Hemoglobin: 11 g/dL — ABNORMAL LOW (ref 13.0–17.0)
MCH: 30.6 pg (ref 26.0–34.0)
MCHC: 31.6 g/dL (ref 30.0–36.0)
MCV: 96.9 fL (ref 80.0–100.0)
Platelets: 436 10*3/uL — ABNORMAL HIGH (ref 150–400)
RBC: 3.59 MIL/uL — ABNORMAL LOW (ref 4.22–5.81)
RDW: 14.1 % (ref 11.5–15.5)
WBC: 15 10*3/uL — ABNORMAL HIGH (ref 4.0–10.5)
nRBC: 0 % (ref 0.0–0.2)

## 2024-04-03 LAB — GLUCOSE, CAPILLARY
Glucose-Capillary: 105 mg/dL — ABNORMAL HIGH (ref 70–99)
Glucose-Capillary: 126 mg/dL — ABNORMAL HIGH (ref 70–99)
Glucose-Capillary: 119 mg/dL — ABNORMAL HIGH (ref 70–99)
Glucose-Capillary: 126 mg/dL — ABNORMAL HIGH (ref 70–99)
Glucose-Capillary: 98 mg/dL (ref 70–99)
Glucose-Capillary: 99 mg/dL (ref 70–99)

## 2024-04-03 LAB — AMMONIA: Ammonia: 79 umol/L — ABNORMAL HIGH (ref 9–35)

## 2024-04-03 LAB — VITAMIN B1: Vitamin B1 (Thiamine): 207.3 nmol/L — ABNORMAL HIGH (ref 66.5–200.0)

## 2024-04-03 LAB — FREE VALPROIC ACID (DEPAKOTE): Valproic Acid, Free: 7.5 ug/mL (ref 6.0–22.0)

## 2024-04-03 MED ORDER — LACTULOSE 10 GM/15ML PO SOLN
20.0000 g | Freq: Every day | ORAL | Status: DC
  Administered 2024-04-04 – 2024-04-05 (×2): 20 g
  Filled 2024-04-03 (×3): qty 30

## 2024-04-03 MED ORDER — LACTULOSE 10 GM/15ML PO SOLN
10.0000 g | Freq: Once | ORAL | Status: AC
  Administered 2024-04-03: 10 g
  Filled 2024-04-03: qty 15

## 2024-04-03 MED ORDER — VALPROIC ACID 250 MG/5ML PO SOLN: 1000.0000 mg | Freq: Three times a day (TID) | ORAL | Status: DC

## 2024-04-03 MED ORDER — SODIUM CHLORIDE 0.9 % IV SOLN
INTRAVENOUS | Status: AC
Start: 2024-04-03 — End: 2024-04-04

## 2024-04-03 MED ORDER — VALPROIC ACID 250 MG/5ML PO SOLN
1000.0000 mg | Freq: Three times a day (TID) | ORAL | Status: DC
Start: 2024-04-05 — End: 2024-04-06
  Administered 2024-04-05 – 2024-04-06 (×4): 1000 mg
  Filled 2024-04-03 (×4): qty 20
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
## 2024-04-03 NOTE — Progress Notes (Signed)
 Vomitted small amount of tube feeds. PA notified, RT notified. Oral care performed including oral sx, strong cough noted., patient able to clear secretions. SaO2 10% on 10l HHF. Tube feeds held as instructed by PA.

## 2024-04-03 NOTE — Procedures (Signed)
 Cortrak  Tube Type:  Cortrak - 43 inches Tube Location:  Right nare Initial Placement:  Stomach Secured by: Bridle Technique Used to Measure Tube Placement:  Marking at nare/corner of mouth Cortrak Secured At:  60 cm   Cortrak Tube Team Note:  Consult received to place a Cortrak feeding tube.   No x-ray is required. RN may begin using tube.   If the tube becomes dislodged please keep the tube and contact the Cortrak team at [email or web address] for replacement.  If after hours and replacement cannot be delayed, place a NG tube and confirm placement with an abdominal x-ray.    Torrance Freestone MS, RD, LDN If unable to be reached, please send secure chat to "RD inpatient" available from 8:00a-4:00p daily
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
## 2024-04-03 NOTE — Consult Note (Signed)
 The client was snoring on attempted assessment.  The RN stated he has intermittent agitation.  This morning after throwing up his tube feeding, he was cooperative and allowed them to baths and care for him without issues.  Depakote still on hold due to increase in ammonia level, new one ordered for tomorrow and will reassess.  Caveat:  His albumin continues to be low and increases in his Depakote are increasing his ammonia levels while his  Depakote level are not in the therapeutic range due to low albumin.  Psych will continue to follow.  Roslynn Coombes, PMHNP

## 2024-04-03 NOTE — Plan of Care (Signed)
   Problem: Activity: Goal: Risk for activity intolerance will decrease Outcome: Progressing   Problem: Coping: Goal: Level of anxiety will decrease Outcome: Progressing   Problem: Pain Managment: Goal: General experience of comfort will improve and/or be controlled Outcome: Progressing

## 2024-04-03 NOTE — Progress Notes (Signed)
  Progress Note   Date: 04/03/2024  Patient Name: Hunter Cook        MRN#: [id number]  Clarification of diagnosis: Transient a-fib, resolved

## 2024-04-03 NOTE — Progress Notes (Addendum)
 RN held tube feed per dayshift RN that stated she had verbal orders from PA to hold as well and it would be addressed 5/10

## 2024-04-04 LAB — GLUCOSE, CAPILLARY
Glucose-Capillary: 105 mg/dL — ABNORMAL HIGH (ref 70–99)
Glucose-Capillary: 94 mg/dL (ref 70–99)
Glucose-Capillary: 109 mg/dL — ABNORMAL HIGH (ref 70–99)
Glucose-Capillary: 108 mg/dL — ABNORMAL HIGH (ref 70–99)
Glucose-Capillary: 108 mg/dL — ABNORMAL HIGH (ref 70–99)
Glucose-Capillary: 135 mg/dL — ABNORMAL HIGH (ref 70–99)

## 2024-04-04 LAB — BASIC METABOLIC PANEL WITH GFR
Anion gap: 10 (ref 5–15)
BUN: 12 mg/dL (ref 6–20)
CO2: 31 mmol/L (ref 22–32)
Calcium: 9.2 mg/dL (ref 8.9–10.3)
Chloride: 100 mmol/L (ref 98–111)
Creatinine, Ser: 0.38 mg/dL — ABNORMAL LOW (ref 0.61–1.24)
GFR, Estimated: >60 mL/min (ref >60)
Glucose, Bld: 95 mg/dL (ref 70–99)
Potassium: 3.6 mmol/L (ref 3.5–5.1)
Sodium: 141 mmol/L (ref 135–145)

## 2024-04-04 LAB — VALPROIC ACID LEVEL: Valproic Acid Lvl: 12 ug/mL — ABNORMAL LOW (ref 50–100)

## 2024-04-04 LAB — AMMONIA: Ammonia: 44 umol/L — ABNORMAL HIGH (ref 9–35)
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
## 2024-04-04 NOTE — Consult Note (Addendum)
 WOC Nurse Consult Note: Reason for Consult: DTPI near rectum  Wound type: Deep Tissue Pressure Injury adjacent to rectum that appears to be evolving  Pressure Injury POA: no  Measurement: see nursing flowsheet  Wound bed: purple maroon discoloration with soft purple center  Drainage (amount, consistency, odor) appears dry  Periwound: intact  Dressing procedure/placement/frequency:  Cleanse DTPI near rectum with soap and water , dry and apply Xeroform gauze ([place] [phone]) to wound bed daily, secure with silicone foam or ABD pad and tape (may attempt to secure with mesh underwear if feasible).    POC discussed with bedside nurse. WOC team will follow this area weekly.   Thank you,    Ronni Colace MSN, RN-BC, [employer] [phone]

## 2024-04-05 ENCOUNTER — Inpatient Hospital Stay (HOSPITAL_COMMUNITY)

## 2024-04-05 LAB — CBC
HCT: 36.7 % — ABNORMAL LOW (ref 39.0–52.0)
Hemoglobin: 12.2 g/dL — ABNORMAL LOW (ref 13.0–17.0)
MCH: 31.5 pg (ref 26.0–34.0)
MCHC: 33.2 g/dL (ref 30.0–36.0)
MCV: 94.8 fL (ref 80.0–100.0)
Platelets: 461 10*3/uL — ABNORMAL HIGH (ref 150–400)
RBC: 3.87 MIL/uL — ABNORMAL LOW (ref 4.22–5.81)
RDW: 13.8 % (ref 11.5–15.5)
WBC: 13.8 10*3/uL — ABNORMAL HIGH (ref 4.0–10.5)
nRBC: 0 % (ref 0.0–0.2)

## 2024-04-05 LAB — BASIC METABOLIC PANEL WITH GFR
Anion gap: 12 (ref 5–15)
BUN: 18 mg/dL (ref 6–20)
CO2: 30 mmol/L (ref 22–32)
Calcium: 9.1 mg/dL (ref 8.9–10.3)
Chloride: 96 mmol/L — ABNORMAL LOW (ref 98–111)
Creatinine, Ser: 0.47 mg/dL — ABNORMAL LOW (ref 0.61–1.24)
GFR, Estimated: >60 mL/min (ref >60)
Glucose, Bld: 97 mg/dL (ref 70–99)
Potassium: 3.6 mmol/L (ref 3.5–5.1)
Sodium: 138 mmol/L (ref 135–145)

## 2024-04-05 LAB — GLUCOSE, CAPILLARY
Glucose-Capillary: 117 mg/dL — ABNORMAL HIGH (ref 70–99)
Glucose-Capillary: 122 mg/dL — ABNORMAL HIGH (ref 70–99)
Glucose-Capillary: 107 mg/dL — ABNORMAL HIGH (ref 70–99)
Glucose-Capillary: 108 mg/dL — ABNORMAL HIGH (ref 70–99)
Glucose-Capillary: 91 mg/dL (ref 70–99)
Glucose-Capillary: 128 mg/dL — ABNORMAL HIGH (ref 70–99)

## 2024-04-05 LAB — AMMONIA: Ammonia: 39 umol/L — ABNORMAL HIGH (ref 9–35)

## 2024-04-05 MED ORDER — FUROSEMIDE 10 MG/ML IJ SOLN
40.0000 mg | Freq: Once | INTRAMUSCULAR | Status: AC
  Administered 2024-04-05: 40 mg via INTRAVENOUS
  Filled 2024-04-05: qty 4

## 2024-04-05 NOTE — Progress Notes (Signed)
 Pt on 15 L of O2, and was sating at 84%. With productive cough. Respiratory Team notified.  RT called and notified to pt's room for sustained desaturations in the mid 80's on 15L Salter nasal Cannula. Assisted coughing initiated with suctioning's,TR was unable to get pt saturations above 86%. RT initiated HHFNC on  pt starting at 40L/80%. Pt sating @ 96%. MD notified. CRX, CBC, and BMP . Will continue monitoring.

## 2024-04-05 NOTE — Plan of Care (Signed)
  Problem: Clinical Measurements: Goal: Ability to maintain clinical measurements within normal limits will improve Outcome: Progressing Goal: Will remain free from infection Outcome: Progressing Goal: Diagnostic test results will improve Outcome: Progressing Goal: Respiratory complications will improve Outcome: Progressing Goal: Cardiovascular complication will be avoided Outcome: Progressing   Problem: Nutrition: Goal: Adequate nutrition will be maintained Outcome: Progressing   Problem: Elimination: Goal: Will not experience complications related to bowel motility Outcome: Progressing Goal: Will not experience complications related to urinary retention Outcome: Progressing   Problem: Pain Managment: Goal: General experience of comfort will improve and/or be controlled Outcome: Progressing   Problem: Safety: Goal: Ability to remain free from injury will improve Outcome: Progressing   Problem: Education: Goal: Individualized Educational Video(s) Outcome: Progressing   Problem: Fluid Volume: Goal: Ability to maintain a balanced intake and output will improve Outcome: Progressing   Problem: Metabolic: Goal: Ability to maintain appropriate glucose levels will improve Outcome: Progressing   Problem: Nutritional: Goal: Maintenance of adequate nutrition will improve Outcome: Progressing Goal: Progress toward achieving an optimal weight will improve Outcome: Progressing   Problem: Tissue Perfusion: Goal: Adequacy of tissue perfusion will improve Outcome: Progressing

## 2024-04-05 NOTE — Progress Notes (Addendum)
 Called for desaturation requiring transition to HFNC. Patient somnolent but arousable on tube feeds. He received 15 mg valium  last at 16:50, 10 mg oxycodone  16:50, 200 mg seroquel  14:09, valproic  acid 1000 mg at 14:09, 2 mg versed  at 11:00. So no documented recent sedating meds. Ammonia improved this morning at 39. -port CXR -CBC/Bmet -stop TF  -XR with bilateral effusions and vascular prominence concerning for overload      -will give lasix  for diuresis

## 2024-04-05 NOTE — Progress Notes (Signed)
 RT called to room for sustained desaturations in the mid to low 80's on 15L Salter nasal cannula.  After assisted coughing and suctioning of thick yellow secretions, RT was unable to get pt saturations above 86%.  RT initiated HHFNC on pt starting at 40L/80% and has  since been able to wean the FiO2 down to 60%.  RT will continue to titrate high flow oxygen and continue to monitor

## 2024-04-05 NOTE — Progress Notes (Signed)
 MD ordered to be stopped. Lasix  40mg  for diuresis ordered. Pt still not following any directive,  hence, Versed  2mg  administered. Will continue monitoring.

## 2024-04-05 NOTE — Plan of Care (Signed)
 Problem: Education: Goal: Knowledge of General Education information will improve Description: Including pain rating scale, medication(s)/side effects and non-pharmacologic comfort measures 04/05/2024 0120 by Alejos Husband, RN Outcome: Progressing 04/05/2024 0120 by Alejos Husband, RN Outcome: Progressing 04/04/2024 2255 by Alejos Husband, RN Outcome: Progressing   Problem: Health Behavior/Discharge Planning: Goal: Ability to manage health-related needs will improve 04/05/2024 0120 by Alejos Husband, RN Outcome: Progressing 04/05/2024 0120 by Alejos Husband, RN Outcome: Progressing 04/04/2024 2255 by Alejos Husband, RN Outcome: Progressing   Problem: Clinical Measurements: Goal: Ability to maintain clinical measurements within normal limits will improve 04/05/2024 0120 by Alejos Husband, RN Outcome: Progressing 04/05/2024 0120 by Alejos Husband, RN Outcome: Progressing 04/04/2024 2255 by Alejos Husband, RN Outcome: Progressing Goal: Will remain free from infection 04/05/2024 0120 by Alejos Husband, RN Outcome: Progressing 04/05/2024 0120 by Alejos Husband, RN Outcome: Progressing 04/04/2024 2255 by Alejos Husband, RN Outcome: Progressing Goal: Diagnostic test results will improve 04/05/2024 0120 by Alejos Husband, RN Outcome: Progressing 04/05/2024 0120 by Alejos Husband, RN Outcome: Progressing 04/04/2024 2255 by Alejos Husband, RN Outcome: Progressing Goal: Respiratory complications will improve 04/05/2024 0120 by Alejos Husband, RN Outcome: Progressing 04/05/2024 0120 by Alejos Husband, RN Outcome: Progressing 04/04/2024 2255 by Alejos Husband, RN Outcome: Progressing Goal: Cardiovascular complication will be avoided 04/05/2024 0120 by Alejos Husband, RN Outcome: Progressing 04/05/2024 0120 by Alejos Husband, RN Outcome: Progressing 04/04/2024 2255 by Alejos Husband, RN Outcome: Progressing    Problem: Activity: Goal: Risk for activity intolerance will decrease 04/05/2024 0120 by Alejos Husband, RN Outcome: Progressing 04/05/2024 0120 by Alejos Husband, RN Outcome: Progressing 04/04/2024 2255 by Alejos Husband, RN Outcome: Progressing   Problem: Nutrition: Goal: Adequate nutrition will be maintained 04/05/2024 0120 by Alejos Husband, RN Outcome: Progressing 04/05/2024 0120 by Alejos Husband, RN Outcome: Progressing 04/04/2024 2255 by Alejos Husband, RN Outcome: Progressing   Problem: Coping: Goal: Level of anxiety will decrease 04/05/2024 0120 by Alejos Husband, RN Outcome: Progressing 04/05/2024 0120 by Alejos Husband, RN Outcome: Progressing 04/04/2024 2255 by Alejos Husband, RN Outcome: Progressing   Problem: Elimination: Goal: Will not experience complications related to bowel motility 04/05/2024 0120 by Alejos Husband, RN Outcome: Progressing 04/05/2024 0120 by Alejos Husband, RN Outcome: Progressing 04/04/2024 2255 by Alejos Husband, RN Outcome: Progressing Goal: Will not experience complications related to urinary retention 04/05/2024 0120 by Alejos Husband, RN Outcome: Progressing 04/05/2024 0120 by Alejos Husband, RN Outcome: Progressing 04/04/2024 2255 by Alejos Husband, RN Outcome: Progressing   Problem: Pain Managment: Goal: General experience of comfort will improve and/or be controlled 04/05/2024 0120 by Alejos Husband, RN Outcome: Progressing 04/05/2024 0120 by Alejos Husband, RN Outcome: Progressing 04/04/2024 2255 by Alejos Husband, RN Outcome: Progressing   Problem: Safety: Goal: Ability to remain free from injury will improve 04/05/2024 0120 by Alejos Husband, RN Outcome: Progressing 04/05/2024 0120 by Alejos Husband, RN Outcome: Progressing 04/04/2024 2255 by Alejos Husband, RN Outcome: Progressing   Problem: Skin Integrity: Goal: Risk for impaired skin  integrity will decrease 04/05/2024 0120 by Alejos Husband, RN Outcome: Progressing 04/05/2024 0120 by Alejos Husband, RN Outcome: Progressing 04/04/2024 2255 by Alejos Husband, RN Outcome: Progressing   Problem: Education: Goal: Ability to describe self-care measures that may prevent or decrease complications (Diabetes Survival Skills Education) will improve 04/05/2024 0120 by  Alejos Husband, RN Outcome: Progressing 04/05/2024 0120 by Alejos Husband, RN Outcome: Progressing 04/04/2024 2255 by Alejos Husband, RN Outcome: Progressing Goal: Individualized Educational Video(s) 04/05/2024 0120 by Alejos Husband, RN Outcome: Progressing 04/05/2024 0120 by Alejos Husband, RN Outcome: Progressing 04/04/2024 2255 by Alejos Husband, RN Outcome: Progressing   Problem: Coping: Goal: Ability to adjust to condition or change in health will improve 04/05/2024 0120 by Alejos Husband, RN Outcome: Progressing 04/04/2024 2255 by Alejos Husband, RN Outcome: Progressing   Problem: Fluid Volume: Goal: Ability to maintain a balanced intake and output will improve 04/05/2024 0120 by Alejos Husband, RN Outcome: Progressing 04/04/2024 2255 by Alejos Husband, RN Outcome: Progressing   Problem: Health Behavior/Discharge Planning: Goal: Ability to identify and utilize available resources and services will improve 04/05/2024 0120 by Alejos Husband, RN Outcome: Progressing 04/04/2024 2255 by Alejos Husband, RN Outcome: Progressing Goal: Ability to manage health-related needs will improve 04/05/2024 0120 by Alejos Husband, RN Outcome: Progressing 04/04/2024 2255 by Alejos Husband, RN Outcome: Progressing   Problem: Metabolic: Goal: Ability to maintain appropriate glucose levels will improve 04/05/2024 0120 by Alejos Husband, RN Outcome: Progressing 04/04/2024 2255 by Alejos Husband, RN Outcome: Progressing   Problem:  Nutritional: Goal: Maintenance of adequate nutrition will improve 04/05/2024 0120 by Alejos Husband, RN Outcome: Progressing 04/04/2024 2255 by Alejos Husband, RN Outcome: Progressing Goal: Progress toward achieving an optimal weight will improve 04/05/2024 0120 by Alejos Husband, RN Outcome: Progressing 04/04/2024 2255 by Alejos Husband, RN Outcome: Progressing   Problem: Skin Integrity: Goal: Risk for impaired skin integrity will decrease 04/05/2024 0120 by Alejos Husband, RN Outcome: Progressing 04/04/2024 2255 by Alejos Husband, RN Outcome: Progressing   Problem: Tissue Perfusion: Goal: Adequacy of tissue perfusion will improve 04/05/2024 0120 by Alejos Husband, RN Outcome: Progressing 04/04/2024 2255 by Alejos Husband, RN Outcome: Progressing

## 2024-04-05 NOTE — Plan of Care (Signed)
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
## 2024-04-06 ENCOUNTER — Inpatient Hospital Stay (HOSPITAL_COMMUNITY)

## 2024-04-06 DIAGNOSIS — D72829 Elevated white blood cell count, unspecified: Secondary | ICD-10-CM | POA: Diagnosis not present

## 2024-04-06 DIAGNOSIS — M79661 Pain in right lower leg: Secondary | ICD-10-CM | POA: Diagnosis not present

## 2024-04-06 LAB — CBC
HCT: 36 % — ABNORMAL LOW (ref 39.0–52.0)
Hemoglobin: 12 g/dL — ABNORMAL LOW (ref 13.0–17.0)
MCH: 31.2 pg (ref 26.0–34.0)
MCHC: 33.3 g/dL (ref 30.0–36.0)
MCV: 93.5 fL (ref 80.0–100.0)
Platelets: 440 10*3/uL — ABNORMAL HIGH (ref 150–400)
RBC: 3.85 MIL/uL — ABNORMAL LOW (ref 4.22–5.81)
RDW: 14 % (ref 11.5–15.5)
WBC: 30.1 10*3/uL — ABNORMAL HIGH (ref 4.0–10.5)
nRBC: 0 % (ref 0.0–0.2)

## 2024-04-06 LAB — BASIC METABOLIC PANEL WITH GFR
Anion gap: 12 (ref 5–15)
BUN: 15 mg/dL (ref 6–20)
CO2: 29 mmol/L (ref 22–32)
Calcium: 9.1 mg/dL (ref 8.9–10.3)
Chloride: 96 mmol/L — ABNORMAL LOW (ref 98–111)
Creatinine, Ser: 0.58 mg/dL — ABNORMAL LOW (ref 0.61–1.24)
GFR, Estimated: >60 mL/min (ref >60)
Glucose, Bld: 112 mg/dL — ABNORMAL HIGH (ref 70–99)
Potassium: 3.7 mmol/L (ref 3.5–5.1)
Sodium: 137 mmol/L (ref 135–145)

## 2024-04-06 LAB — GLUCOSE, CAPILLARY
Glucose-Capillary: 100 mg/dL — ABNORMAL HIGH (ref 70–99)
Glucose-Capillary: 110 mg/dL — ABNORMAL HIGH (ref 70–99)
Glucose-Capillary: 111 mg/dL — ABNORMAL HIGH (ref 70–99)
Glucose-Capillary: 123 mg/dL — ABNORMAL HIGH (ref 70–99)
Glucose-Capillary: 142 mg/dL — ABNORMAL HIGH (ref 70–99)
Glucose-Capillary: 89 mg/dL (ref 70–99)

## 2024-04-06 LAB — AMMONIA: Ammonia: 59 umol/L — ABNORMAL HIGH (ref 9–35)

## 2024-04-06 MED ORDER — LACTULOSE 10 GM/15ML PO SOLN
30.0000 g | Freq: Every day | ORAL | Status: DC
  Administered 2024-04-07: 30 g
  Filled 2024-04-06: qty 45

## 2024-04-06 MED ORDER — LORAZEPAM 1 MG PO TABS
2.0000 mg | ORAL_TABLET | Freq: Four times a day (QID) | ORAL | Status: DC
  Administered 2024-04-06 – 2024-04-07 (×4): 2 mg
  Filled 2024-04-06 (×4): qty 2

## 2024-04-06 MED ORDER — THIAMINE MONONITRATE 100 MG PO TABS
250.0000 mg | ORAL_TABLET | Freq: Every day | ORAL | Status: DC
  Administered 2024-04-06 – 2024-04-07 (×2): 250 mg
  Filled 2024-04-06 (×2): qty 3

## 2024-04-06 MED ORDER — THIAMINE MONONITRATE 100 MG PO TABS: 250.0000 mg | ORAL_TABLET | Freq: Every day | ORAL | Status: DC

## 2024-04-06 NOTE — Progress Notes (Signed)
 Bilateral upper venous duplex and bilateral lower venous duplex has been completed. Preliminary results can be found in CV Proc through chart review.  Results were given to the patient's nurse, Susana Enter.  04/06/24 3:06 PM Birda Buffy RVT
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
## 2024-04-06 NOTE — Plan of Care (Signed)
 Patient doing much better today.  Psy increased PO meds and IV meds were not needed today.  Took out of leg restraints and possible could work to removal of arm restraints .  Is following commands and got to chair today.

## 2024-04-06 NOTE — Plan of Care (Signed)
 Problem: Education: Goal: Knowledge of General Education information will improve Description: Including pain rating scale, medication(s)/side effects and non-pharmacologic comfort measures 04/06/2024 0217 by Alejos Husband, RN Outcome: Progressing 04/05/2024 2017 by Alejos Husband, RN Outcome: Progressing   Problem: Health Behavior/Discharge Planning: Goal: Ability to manage health-related needs will improve 04/06/2024 0217 by Alejos Husband, RN Outcome: Progressing 04/05/2024 2017 by Alejos Husband, RN Outcome: Progressing   Problem: Clinical Measurements: Goal: Ability to maintain clinical measurements within normal limits will improve 04/06/2024 0217 by Alejos Husband, RN Outcome: Progressing 04/05/2024 2017 by Alejos Husband, RN Outcome: Progressing Goal: Will remain free from infection 04/06/2024 0217 by Alejos Husband, RN Outcome: Progressing 04/05/2024 2017 by Alejos Husband, RN Outcome: Progressing Goal: Diagnostic test results will improve 04/06/2024 0217 by Alejos Husband, RN Outcome: Progressing 04/05/2024 2017 by Alejos Husband, RN Outcome: Progressing Goal: Respiratory complications will improve 04/06/2024 0217 by Alejos Husband, RN Outcome: Progressing 04/05/2024 2017 by Alejos Husband, RN Outcome: Progressing Goal: Cardiovascular complication will be avoided 04/06/2024 0217 by Alejos Husband, RN Outcome: Progressing 04/05/2024 2017 by Alejos Husband, RN Outcome: Progressing   Problem: Activity: Goal: Risk for activity intolerance will decrease 04/06/2024 0217 by Alejos Husband, RN Outcome: Progressing 04/05/2024 2017 by Alejos Husband, RN Outcome: Progressing   Problem: Nutrition: Goal: Adequate nutrition will be maintained 04/06/2024 0217 by Alejos Husband, RN Outcome: Progressing 04/05/2024 2017 by Alejos Husband, RN Outcome: Progressing   Problem: Coping: Goal: Level of anxiety will  decrease 04/06/2024 0217 by Alejos Husband, RN Outcome: Progressing 04/05/2024 2017 by Alejos Husband, RN Outcome: Progressing   Problem: Elimination: Goal: Will not experience complications related to bowel motility 04/06/2024 0217 by Alejos Husband, RN Outcome: Progressing 04/05/2024 2017 by Alejos Husband, RN Outcome: Progressing Goal: Will not experience complications related to urinary retention 04/06/2024 0217 by Alejos Husband, RN Outcome: Progressing 04/05/2024 2017 by Alejos Husband, RN Outcome: Progressing   Problem: Pain Managment: Goal: General experience of comfort will improve and/or be controlled 04/06/2024 0217 by Alejos Husband, RN Outcome: Progressing 04/05/2024 2017 by Alejos Husband, RN Outcome: Progressing   Problem: Safety: Goal: Ability to remain free from injury will improve 04/06/2024 0217 by Alejos Husband, RN Outcome: Progressing 04/05/2024 2017 by Alejos Husband, RN Outcome: Progressing   Problem: Skin Integrity: Goal: Risk for impaired skin integrity will decrease 04/06/2024 0217 by Alejos Husband, RN Outcome: Progressing 04/05/2024 2017 by Alejos Husband, RN Outcome: Progressing   Problem: Education: Goal: Ability to describe self-care measures that may prevent or decrease complications (Diabetes Survival Skills Education) will improve 04/06/2024 0217 by Alejos Husband, RN Outcome: Progressing 04/05/2024 2017 by Alejos Husband, RN Outcome: Progressing Goal: Individualized Educational Video(s) 04/06/2024 0217 by Alejos Husband, RN Outcome: Progressing 04/05/2024 2017 by Alejos Husband, RN Outcome: Progressing   Problem: Coping: Goal: Ability to adjust to condition or change in health will improve 04/06/2024 0217 by Alejos Husband, RN Outcome: Progressing 04/05/2024 2017 by Alejos Husband, RN Outcome: Progressing   Problem: Fluid Volume: Goal: Ability to maintain a balanced  intake and output will improve 04/06/2024 0217 by Alejos Husband, RN Outcome: Progressing 04/05/2024 2017 by Alejos Husband, RN Outcome: Progressing   Problem: Health Behavior/Discharge Planning: Goal: Ability to identify and utilize available resources and services will improve 04/06/2024 0217 by Alejos Husband, RN Outcome: Progressing 04/05/2024 2017 by  Alejos Husband, RN Outcome: Progressing Goal: Ability to manage health-related needs will improve 04/06/2024 0217 by Alejos Husband, RN Outcome: Progressing 04/05/2024 2017 by Alejos Husband, RN Outcome: Progressing   Problem: Metabolic: Goal: Ability to maintain appropriate glucose levels will improve 04/06/2024 0217 by Alejos Husband, RN Outcome: Progressing 04/05/2024 2017 by Alejos Husband, RN Outcome: Progressing   Problem: Nutritional: Goal: Maintenance of adequate nutrition will improve 04/06/2024 0217 by Alejos Husband, RN Outcome: Progressing 04/05/2024 2017 by Alejos Husband, RN Outcome: Progressing Goal: Progress toward achieving an optimal weight will improve 04/06/2024 0217 by Alejos Husband, RN Outcome: Progressing 04/05/2024 2017 by Alejos Husband, RN Outcome: Progressing   Problem: Skin Integrity: Goal: Risk for impaired skin integrity will decrease 04/06/2024 0217 by Alejos Husband, RN Outcome: Progressing 04/05/2024 2017 by Alejos Husband, RN Outcome: Progressing   Problem: Tissue Perfusion: Goal: Adequacy of tissue perfusion will improve 04/06/2024 0217 by Alejos Husband, RN Outcome: Progressing 04/05/2024 2017 by Alejos Husband, RN Outcome: Progressing

## 2024-04-06 NOTE — Progress Notes (Signed)
 In and out Cath performed on the pt. Bladder scan performed and a total of801 ml of urine scanned. In and out scan performed, and a total of 1100 ml of urine drained.
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
## 2024-04-07 ENCOUNTER — Telehealth (HOSPITAL_COMMUNITY): Payer: Self-pay | Admitting: Pharmacy Technician

## 2024-04-07 ENCOUNTER — Other Ambulatory Visit (HOSPITAL_COMMUNITY): Payer: Self-pay

## 2024-04-07 ENCOUNTER — Inpatient Hospital Stay (HOSPITAL_COMMUNITY)

## 2024-04-07 LAB — BASIC METABOLIC PANEL WITH GFR
Anion gap: 9 (ref 5–15)
BUN: 15 mg/dL (ref 6–20)
CO2: 30 mmol/L (ref 22–32)
Calcium: 9.3 mg/dL (ref 8.9–10.3)
Chloride: 100 mmol/L (ref 98–111)
Creatinine, Ser: 0.46 mg/dL — ABNORMAL LOW (ref 0.61–1.24)
GFR, Estimated: >60 mL/min (ref >60)
Glucose, Bld: 121 mg/dL — ABNORMAL HIGH (ref 70–99)
Potassium: 4 mmol/L (ref 3.5–5.1)
Sodium: 139 mmol/L (ref 135–145)

## 2024-04-07 LAB — URINALYSIS, ROUTINE W REFLEX MICROSCOPIC
Bilirubin Urine: NEGATIVE
Glucose, UA: NEGATIVE mg/dL
Hgb urine dipstick: NEGATIVE
Ketones, ur: NEGATIVE mg/dL
Leukocytes,Ua: NEGATIVE
Nitrite: NEGATIVE
Protein, ur: NEGATIVE mg/dL
Specific Gravity, Urine: 1.026 (ref 1.005–1.030)
pH: 5 (ref 5.0–8.0)

## 2024-04-07 LAB — GLUCOSE, CAPILLARY
Glucose-Capillary: 114 mg/dL — ABNORMAL HIGH (ref 70–99)
Glucose-Capillary: 119 mg/dL — ABNORMAL HIGH (ref 70–99)
Glucose-Capillary: 112 mg/dL — ABNORMAL HIGH (ref 70–99)
Glucose-Capillary: 111 mg/dL — ABNORMAL HIGH (ref 70–99)
Glucose-Capillary: 125 mg/dL — ABNORMAL HIGH (ref 70–99)
Glucose-Capillary: 126 mg/dL — ABNORMAL HIGH (ref 70–99)

## 2024-04-07 LAB — CBC
HCT: 37.5 % — ABNORMAL LOW (ref 39.0–52.0)
Hemoglobin: 12.3 g/dL — ABNORMAL LOW (ref 13.0–17.0)
MCH: 30.6 pg (ref 26.0–34.0)
MCHC: 32.8 g/dL (ref 30.0–36.0)
MCV: 93.3 fL (ref 80.0–100.0)
Platelets: 441 10*3/uL — ABNORMAL HIGH (ref 150–400)
RBC: 4.02 MIL/uL — ABNORMAL LOW (ref 4.22–5.81)
RDW: 14 % (ref 11.5–15.5)
WBC: 15.9 10*3/uL — ABNORMAL HIGH (ref 4.0–10.5)
nRBC: 0 % (ref 0.0–0.2)

## 2024-04-07 LAB — AMMONIA: Ammonia: 84 umol/L — ABNORMAL HIGH (ref 9–35)

## 2024-04-07 MED ORDER — OXYCODONE HCL 5 MG PO TABS: 5.0000 mg | ORAL_TABLET | Freq: Four times a day (QID) | ORAL | Status: DC

## 2024-04-07 MED ORDER — GUAIFENESIN 100 MG/5ML PO LIQD
15.0000 mL | ORAL | Status: DC
  Administered 2024-04-07 – 2024-04-21 (×60): 15 mL via ORAL
  Filled 2024-04-07 (×63): qty 20

## 2024-04-07 MED ORDER — OXYCODONE HCL 5 MG PO TABS
5.0000 mg | ORAL_TABLET | Freq: Four times a day (QID) | ORAL | Status: DC
  Administered 2024-04-07: 5 mg
  Filled 2024-04-07: qty 1

## 2024-04-07 MED ORDER — AMANTADINE HCL 50 MG/5ML PO SOLN
100.0000 mg | Freq: Every day | ORAL | Status: DC
  Administered 2024-04-08 – 2024-04-15 (×7): 100 mg via ORAL
  Filled 2024-04-07 (×8): qty 10

## 2024-04-07 MED ORDER — THIAMINE MONONITRATE 100 MG PO TABS
250.0000 mg | ORAL_TABLET | Freq: Every day | ORAL | Status: AC
  Administered 2024-04-08 – 2024-04-10 (×3): 250 mg via ORAL
  Filled 2024-04-07 (×3): qty 3

## 2024-04-07 MED ORDER — LACTULOSE 10 GM/15ML PO SOLN
30.0000 g | Freq: Three times a day (TID) | ORAL | Status: DC
  Administered 2024-04-07 – 2024-04-08 (×3): 30 g
  Filled 2024-04-07 (×3): qty 45

## 2024-04-07 MED ORDER — BANATROL TF EN LIQD
60.0000 mL | Freq: Three times a day (TID) | ENTERAL | Status: DC
  Administered 2024-04-07 – 2024-04-13 (×18): 60 mL via ORAL
  Filled 2024-04-07 (×18): qty 60

## 2024-04-07 MED ORDER — BUSPIRONE HCL 5 MG PO TABS
7.5000 mg | ORAL_TABLET | Freq: Two times a day (BID) | ORAL | Status: DC
  Administered 2024-04-07 – 2024-04-14 (×13): 7.5 mg via ORAL
  Filled 2024-04-07 (×13): qty 2

## 2024-04-07 MED ORDER — DILTIAZEM HCL 60 MG PO TABS
60.0000 mg | ORAL_TABLET | Freq: Three times a day (TID) | ORAL | Status: DC
  Administered 2024-04-07 – 2024-04-21 (×37): 60 mg via ORAL
  Filled 2024-04-07 (×42): qty 1

## 2024-04-07 MED ORDER — ACETAMINOPHEN 500 MG PO TABS
1000.0000 mg | ORAL_TABLET | Freq: Four times a day (QID) | ORAL | Status: DC
  Administered 2024-04-07 – 2024-04-21 (×42): 1000 mg via ORAL
  Filled 2024-04-07 (×51): qty 2

## 2024-04-07 MED ORDER — OXYCODONE HCL 5 MG PO TABS
5.0000 mg | ORAL_TABLET | Freq: Four times a day (QID) | ORAL | Status: DC
  Administered 2024-04-07 – 2024-04-21 (×39): 5 mg via ORAL
  Filled 2024-04-07 (×46): qty 1

## 2024-04-07 MED ORDER — DIAZEPAM 5 MG PO TABS
15.0000 mg | ORAL_TABLET | ORAL | Status: DC
  Administered 2024-04-07 – 2024-04-16 (×46): 15 mg via ORAL
  Filled 2024-04-07 (×49): qty 3

## 2024-04-07 MED ORDER — ENOXAPARIN SODIUM 100 MG/ML IJ SOSY
100.0000 mg | PREFILLED_SYRINGE | Freq: Two times a day (BID) | INTRAMUSCULAR | Status: DC
  Administered 2024-04-07 – 2024-04-09 (×3): 100 mg via SUBCUTANEOUS
  Filled 2024-04-07 (×4): qty 1

## 2024-04-07 MED ORDER — JEVITY 1.5 CAL/FIBER PO LIQD
1000.0000 mL | ORAL | Status: DC
Start: 2024-04-07 — End: 2024-04-08
  Administered 2024-04-07: 1000 mL
  Filled 2024-04-07 (×2): qty 1000

## 2024-04-07 MED ORDER — ENOXAPARIN SODIUM 60 MG/0.6ML IJ SOSY
60.0000 mg | PREFILLED_SYRINGE | Freq: Once | INTRAMUSCULAR | Status: AC
Start: 2024-04-07 — End: 2024-04-07
  Administered 2024-04-07: 60 mg via SUBCUTANEOUS
  Filled 2024-04-07: qty 0.6

## 2024-04-07 MED ORDER — PROSOURCE TF20 ENFIT COMPATIBL EN LIQD
60.0000 mL | Freq: Every day | ENTERAL | Status: DC
Start: 2024-04-07 — End: 2024-04-08
  Administered 2024-04-07 – 2024-04-08 (×2): 60 mL
  Filled 2024-04-07 (×2): qty 60
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
## 2024-04-07 NOTE — Telephone Encounter (Signed)
 Patient Product/process development scientist completed.    The patient is insured through [employer]. Patient has Medicare and is not eligible for a copay card, but may be able to apply for patient assistance or Medicare RX Payment Plan (Patient Must reach out to their plan, if eligible for payment plan), if available.    Ran test claim for Eliquis 5 mg and the current 30 day co-pay is $4.80.   This test claim was processed through [facility]- copay amounts may vary at other pharmacies due to pharmacy/plan contracts, or as the patient moves through the different stages of their insurance plan.     Roland Earl, CPHT Pharmacy Technician III Certified Patient Advocate [facility] Patient Advocate Team Direct Number: [phone]  Fax: [phone]

## 2024-04-07 NOTE — Plan of Care (Signed)
 Problem: Education: Goal: Knowledge of General Education information will improve Description: Including pain rating scale, medication(s)/side effects and non-pharmacologic comfort measures 04/07/2024 0317 by Alejos Husband, RN Outcome: Progressing 04/06/2024 2105 by Alejos Husband, RN Outcome: Progressing   Problem: Health Behavior/Discharge Planning: Goal: Ability to manage health-related needs will improve 04/07/2024 0317 by Alejos Husband, RN Outcome: Progressing 04/06/2024 2105 by Alejos Husband, RN Outcome: Progressing   Problem: Clinical Measurements: Goal: Ability to maintain clinical measurements within normal limits will improve 04/07/2024 0317 by Alejos Husband, RN Outcome: Progressing 04/06/2024 2105 by Alejos Husband, RN Outcome: Progressing Goal: Will remain free from infection 04/07/2024 0317 by Alejos Husband, RN Outcome: Progressing 04/06/2024 2105 by Alejos Husband, RN Outcome: Progressing Goal: Diagnostic test results will improve 04/07/2024 0317 by Alejos Husband, RN Outcome: Progressing 04/06/2024 2105 by Alejos Husband, RN Outcome: Progressing Goal: Respiratory complications will improve 04/07/2024 0317 by Alejos Husband, RN Outcome: Progressing 04/06/2024 2105 by Alejos Husband, RN Outcome: Progressing Goal: Cardiovascular complication will be avoided 04/07/2024 0317 by Alejos Husband, RN Outcome: Progressing 04/06/2024 2105 by Alejos Husband, RN Outcome: Progressing   Problem: Activity: Goal: Risk for activity intolerance will decrease 04/07/2024 0317 by Alejos Husband, RN Outcome: Progressing 04/06/2024 2105 by Alejos Husband, RN Outcome: Progressing   Problem: Nutrition: Goal: Adequate nutrition will be maintained 04/07/2024 0317 by Alejos Husband, RN Outcome: Progressing 04/06/2024 2105 by Alejos Husband, RN Outcome: Progressing   Problem: Coping: Goal: Level of anxiety will  decrease 04/07/2024 0317 by Alejos Husband, RN Outcome: Progressing 04/06/2024 2105 by Alejos Husband, RN Outcome: Progressing   Problem: Elimination: Goal: Will not experience complications related to bowel motility 04/07/2024 0317 by Alejos Husband, RN Outcome: Progressing 04/06/2024 2105 by Alejos Husband, RN Outcome: Progressing Goal: Will not experience complications related to urinary retention 04/07/2024 0317 by Alejos Husband, RN Outcome: Progressing 04/06/2024 2105 by Alejos Husband, RN Outcome: Progressing   Problem: Pain Managment: Goal: General experience of comfort will improve and/or be controlled 04/07/2024 0317 by Alejos Husband, RN Outcome: Progressing 04/06/2024 2105 by Alejos Husband, RN Outcome: Progressing   Problem: Safety: Goal: Ability to remain free from injury will improve 04/07/2024 0317 by Alejos Husband, RN Outcome: Progressing 04/06/2024 2105 by Alejos Husband, RN Outcome: Progressing   Problem: Skin Integrity: Goal: Risk for impaired skin integrity will decrease 04/07/2024 0317 by Alejos Husband, RN Outcome: Progressing 04/06/2024 2105 by Alejos Husband, RN Outcome: Progressing   Problem: Education: Goal: Ability to describe self-care measures that may prevent or decrease complications (Diabetes Survival Skills Education) will improve 04/07/2024 0317 by Alejos Husband, RN Outcome: Progressing 04/06/2024 2105 by Alejos Husband, RN Outcome: Progressing Goal: Individualized Educational Video(s) 04/07/2024 0317 by Alejos Husband, RN Outcome: Progressing 04/06/2024 2105 by Alejos Husband, RN Outcome: Progressing   Problem: Coping: Goal: Ability to adjust to condition or change in health will improve 04/07/2024 0317 by Alejos Husband, RN Outcome: Progressing 04/06/2024 2105 by Alejos Husband, RN Outcome: Progressing   Problem: Fluid Volume: Goal: Ability to maintain a balanced  intake and output will improve 04/07/2024 0317 by Alejos Husband, RN Outcome: Progressing 04/06/2024 2105 by Alejos Husband, RN Outcome: Progressing   Problem: Health Behavior/Discharge Planning: Goal: Ability to identify and utilize available resources and services will improve 04/07/2024 0317 by Alejos Husband, RN Outcome: Progressing 04/06/2024 2105 by  Alejos Husband, RN Outcome: Progressing Goal: Ability to manage health-related needs will improve 04/07/2024 0317 by Alejos Husband, RN Outcome: Progressing 04/06/2024 2105 by Alejos Husband, RN Outcome: Progressing   Problem: Metabolic: Goal: Ability to maintain appropriate glucose levels will improve 04/07/2024 0317 by Alejos Husband, RN Outcome: Progressing 04/06/2024 2105 by Alejos Husband, RN Outcome: Progressing   Problem: Nutritional: Goal: Maintenance of adequate nutrition will improve 04/07/2024 0317 by Alejos Husband, RN Outcome: Progressing 04/06/2024 2105 by Alejos Husband, RN Outcome: Progressing Goal: Progress toward achieving an optimal weight will improve 04/07/2024 0317 by Alejos Husband, RN Outcome: Progressing 04/06/2024 2105 by Alejos Husband, RN Outcome: Progressing   Problem: Skin Integrity: Goal: Risk for impaired skin integrity will decrease 04/07/2024 0317 by Alejos Husband, RN Outcome: Progressing 04/06/2024 2105 by Alejos Husband, RN Outcome: Progressing   Problem: Tissue Perfusion: Goal: Adequacy of tissue perfusion will improve 04/07/2024 0317 by Alejos Husband, RN Outcome: Progressing 04/06/2024 2105 by Alejos Husband, RN Outcome: Progressing
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
## 2024-04-07 NOTE — Plan of Care (Signed)
 Patient up to chair and to [facility] several times today. Was able to removed all restraints and following commands.  Still crys sometimes.  Is confused at times be redirectable. Swallow study and advanced to thickened liquids.
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
## 2024-04-08 LAB — BASIC METABOLIC PANEL WITH GFR
Anion gap: 8 (ref 5–15)
BUN: 12 mg/dL (ref 6–20)
CO2: 30 mmol/L (ref 22–32)
Calcium: 9.4 mg/dL (ref 8.9–10.3)
Chloride: 101 mmol/L (ref 98–111)
Creatinine, Ser: 0.5 mg/dL — ABNORMAL LOW (ref 0.61–1.24)
GFR, Estimated: >60 mL/min (ref >60)
Glucose, Bld: 119 mg/dL — ABNORMAL HIGH (ref 70–99)
Potassium: 3.9 mmol/L (ref 3.5–5.1)
Sodium: 139 mmol/L (ref 135–145)

## 2024-04-08 LAB — CBC
HCT: 38.2 % — ABNORMAL LOW (ref 39.0–52.0)
Hemoglobin: 12.6 g/dL — ABNORMAL LOW (ref 13.0–17.0)
MCH: 31 pg (ref 26.0–34.0)
MCHC: 33 g/dL (ref 30.0–36.0)
MCV: 93.9 fL (ref 80.0–100.0)
Platelets: 497 10*3/uL — ABNORMAL HIGH (ref 150–400)
RBC: 4.07 MIL/uL — ABNORMAL LOW (ref 4.22–5.81)
RDW: 14.1 % (ref 11.5–15.5)
WBC: 15.2 10*3/uL — ABNORMAL HIGH (ref 4.0–10.5)
nRBC: 0 % (ref 0.0–0.2)

## 2024-04-08 LAB — GLUCOSE, CAPILLARY
Glucose-Capillary: 101 mg/dL — ABNORMAL HIGH (ref 70–99)
Glucose-Capillary: 104 mg/dL — ABNORMAL HIGH (ref 70–99)
Glucose-Capillary: 107 mg/dL — ABNORMAL HIGH (ref 70–99)
Glucose-Capillary: 112 mg/dL — ABNORMAL HIGH (ref 70–99)
Glucose-Capillary: 117 mg/dL — ABNORMAL HIGH (ref 70–99)
Glucose-Capillary: 140 mg/dL — ABNORMAL HIGH (ref 70–99)

## 2024-04-08 LAB — AMMONIA: Ammonia: 53 umol/L — ABNORMAL HIGH (ref 9–35)

## 2024-04-08 MED ORDER — ENSURE ENLIVE PO LIQD
237.0000 mL | Freq: Two times a day (BID) | ORAL | Status: DC
  Administered 2024-04-08 – 2024-04-09 (×2): 237 mL via ORAL

## 2024-04-08 MED ORDER — OXYCODONE HCL 5 MG PO TABS
10.0000 mg | ORAL_TABLET | ORAL | Status: DC | PRN
  Administered 2024-04-09: 15 mg via ORAL
  Administered 2024-04-09 – 2024-04-13 (×3): 10 mg via ORAL
  Administered 2024-04-14 – 2024-04-16 (×6): 15 mg via ORAL
  Administered 2024-04-16: 10 mg via ORAL
  Administered 2024-04-17 – 2024-04-18 (×2): 15 mg via ORAL
  Administered 2024-04-18: 10 mg via ORAL
  Administered 2024-04-18 – 2024-04-19 (×3): 15 mg via ORAL
  Administered 2024-04-19: 10 mg via ORAL
  Administered 2024-04-19: 15 mg via ORAL
  Administered 2024-04-20: 10 mg via ORAL
  Administered 2024-04-20: 15 mg via ORAL
  Filled 2024-04-08: qty 3
  Filled 2024-04-08 (×2): qty 2
  Filled 2024-04-08 (×4): qty 3
  Filled 2024-04-08 (×2): qty 2
  Filled 2024-04-08 (×4): qty 3
  Filled 2024-04-08: qty 2
  Filled 2024-04-08: qty 3
  Filled 2024-04-08: qty 2
  Filled 2024-04-08 (×4): qty 3

## 2024-04-08 MED ORDER — JEVITY 1.5 CAL/FIBER PO LIQD
1000.0000 mL | Freq: Every day | ORAL | Status: DC
  Administered 2024-04-08: 1000 mL
  Filled 2024-04-08 (×2): qty 1000

## 2024-04-08 MED ORDER — LACTULOSE 10 GM/15ML PO SOLN
30.0000 g | Freq: Two times a day (BID) | ORAL | Status: DC
  Administered 2024-04-08 – 2024-04-09 (×2): 30 g via ORAL
  Filled 2024-04-08 (×2): qty 45

## 2024-04-08 MED ORDER — QUETIAPINE FUMARATE 100 MG PO TABS
200.0000 mg | ORAL_TABLET | Freq: Three times a day (TID) | ORAL | Status: DC
  Administered 2024-04-08 – 2024-04-15 (×21): 200 mg via ORAL
  Filled 2024-04-08 (×21): qty 2

## 2024-04-08 MED ORDER — METOPROLOL TARTRATE 50 MG PO TABS
100.0000 mg | ORAL_TABLET | Freq: Two times a day (BID) | ORAL | Status: DC
  Administered 2024-04-08 – 2024-04-21 (×24): 100 mg via ORAL
  Filled 2024-04-08 (×8): qty 2
  Filled 2024-04-08: qty 4
  Filled 2024-04-08 (×16): qty 2

## 2024-04-08 MED ORDER — IBUPROFEN 200 MG PO TABS: 600.0000 mg | ORAL_TABLET | Freq: Four times a day (QID) | ORAL | Status: DC | PRN
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
## 2024-04-08 NOTE — Consult Note (Signed)
 WOC Nurse wound follow up Refer to previous WOC consult on 5/10.  Pt was previously noted to have a Deep tissue pressure injury near the rectum.  This is slowly receeding; .2X.2cm darker-colored intact skin.  Present on admission: No No topical treatment is indicated at this time.  Orders provided for bedside nurses to perform as follows: May leave deep tissue pressure injury near rectum open to air. WOC team will reassess weekly to determine if a change in the plan of care is indicated at that time.  Thank-you,  Wiliam Harder MSN, RN, CWOCN, [place], CNS [phone]
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
## 2024-04-08 NOTE — Plan of Care (Signed)

## 2024-04-09 ENCOUNTER — Other Ambulatory Visit (HOSPITAL_COMMUNITY): Payer: Self-pay

## 2024-04-09 LAB — GLUCOSE, CAPILLARY
Glucose-Capillary: 104 mg/dL — ABNORMAL HIGH (ref 70–99)
Glucose-Capillary: 109 mg/dL — ABNORMAL HIGH (ref 70–99)
Glucose-Capillary: 114 mg/dL — ABNORMAL HIGH (ref 70–99)
Glucose-Capillary: 118 mg/dL — ABNORMAL HIGH (ref 70–99)
Glucose-Capillary: 118 mg/dL — ABNORMAL HIGH (ref 70–99)
Glucose-Capillary: 111 mg/dL — ABNORMAL HIGH (ref 70–99)

## 2024-04-09 LAB — CBC
HCT: 39.9 % (ref 39.0–52.0)
Hemoglobin: 13 g/dL (ref 13.0–17.0)
MCH: 30.3 pg (ref 26.0–34.0)
MCHC: 32.6 g/dL (ref 30.0–36.0)
MCV: 93 fL (ref 80.0–100.0)
Platelets: 619 10*3/uL — ABNORMAL HIGH (ref 150–400)
RBC: 4.29 MIL/uL (ref 4.22–5.81)
RDW: 14.3 % (ref 11.5–15.5)
WBC: 16.6 10*3/uL — ABNORMAL HIGH (ref 4.0–10.5)
nRBC: 0 % (ref 0.0–0.2)

## 2024-04-09 LAB — BASIC METABOLIC PANEL WITH GFR
Anion gap: 11 (ref 5–15)
BUN: 13 mg/dL (ref 6–20)
CO2: 26 mmol/L (ref 22–32)
Calcium: 9.7 mg/dL (ref 8.9–10.3)
Chloride: 101 mmol/L (ref 98–111)
Creatinine, Ser: 0.55 mg/dL — ABNORMAL LOW (ref 0.61–1.24)
GFR, Estimated: >60 mL/min (ref >60)
Glucose, Bld: 107 mg/dL — ABNORMAL HIGH (ref 70–99)
Potassium: 3.8 mmol/L (ref 3.5–5.1)
Sodium: 138 mmol/L (ref 135–145)

## 2024-04-09 LAB — AMMONIA: Ammonia: 56 umol/L — ABNORMAL HIGH (ref 9–35)

## 2024-04-09 MED ORDER — JEVITY 1.5 CAL/FIBER PO LIQD
1000.0000 mL | Freq: Every day | ORAL | Status: DC
  Administered 2024-04-09: 1000 mL
  Filled 2024-04-09: qty 1000

## 2024-04-09 MED ORDER — APIXABAN 5 MG PO TABS: 5.0000 mg | ORAL_TABLET | Freq: Two times a day (BID) | ORAL | Status: DC

## 2024-04-09 MED ORDER — PROSOURCE TF20 ENFIT COMPATIBL EN LIQD
60.0000 mL | Freq: Two times a day (BID) | ENTERAL | Status: DC
  Administered 2024-04-09 – 2024-04-13 (×9): 60 mL
  Filled 2024-04-09 (×9): qty 60

## 2024-04-09 MED ORDER — LACTULOSE 10 GM/15ML PO SOLN
30.0000 g | Freq: Three times a day (TID) | ORAL | Status: DC
  Administered 2024-04-09 – 2024-04-13 (×13): 30 g via ORAL
  Filled 2024-04-09 (×20): qty 45

## 2024-04-09 MED ORDER — APIXABAN 5 MG PO TABS: 10.0000 mg | ORAL_TABLET | Freq: Two times a day (BID) | ORAL | Status: DC

## 2024-04-09 MED ORDER — PROSOURCE PLUS PO LIQD
30.0000 mL | Freq: Two times a day (BID) | ORAL | Status: DC
  Administered 2024-04-09 – 2024-04-12 (×6): 30 mL via ORAL
  Filled 2024-04-09 (×7): qty 30

## 2024-04-09 MED ORDER — APIXABAN 5 MG PO TABS
10.0000 mg | ORAL_TABLET | Freq: Two times a day (BID) | ORAL | Status: AC
  Administered 2024-04-09 – 2024-04-16 (×14): 10 mg via ORAL
  Filled 2024-04-09 (×14): qty 2

## 2024-04-09 MED ORDER — ENSURE ENLIVE PO LIQD: 237.0000 mL | Freq: Three times a day (TID) | ORAL | Status: DC

## 2024-04-09 MED ORDER — APIXABAN 5 MG PO TABS
5.0000 mg | ORAL_TABLET | Freq: Two times a day (BID) | ORAL | Status: DC
  Administered 2024-04-16 – 2024-04-21 (×10): 5 mg via ORAL
  Filled 2024-04-09 (×11): qty 1
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
## 2024-04-09 NOTE — Progress Notes (Signed)
 Pt tearful, refusing to keep tele wires hooked up, took miami J collar off, told this RN "I'm tired of this, I'm about to walk out of here" This RN educated importance of the collar as well as being on the monitor, Pt still refused and then proceeded to slide himself down the bed slowly with brace off. After about , pt asked to put brace back on and to be repositioned. Pt apologized and became tearful, support given to patient by this RN and NT  Will continue care
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
## 2024-04-09 NOTE — Discharge Instructions (Addendum)
 Cam Boot with heel lift at all times No chew/pureed diet x1 more week.  You need to see Dr. Lydia Sams for him to release you to a "chew" diet. Vaseline gauze and then a dry dressing over the back of your heel wound  Information on my medicine - ELIQUIS  (apixaban )  This medication education was reviewed with me or my healthcare representative as part of my discharge preparation.   Why was Eliquis  prescribed for you? Eliquis  was prescribed to treat blood clots that may have been found in the veins of your legs (deep vein thrombosis) or in your lungs (pulmonary embolism) and to reduce the risk of them occurring again.  What do You need to know about Eliquis  ? The starting dose is 10 mg (two 5 mg tablets) taken TWICE daily for the FIRST SEVEN (7) DAYS, then on (enter date)  04/16/2024 PM  the dose is reduced to ONE 5 mg tablet taken TWICE daily.  Eliquis  may be taken with or without food.   Try to take the dose about the same time in the morning and in the evening. If you have difficulty swallowing the tablet whole please discuss with your pharmacist how to take the medication safely.  Take Eliquis  exactly as prescribed and DO NOT stop taking Eliquis  without talking to the doctor who prescribed the medication.  Stopping may increase your risk of developing a new blood clot.  Refill your prescription before you run out.  After discharge, you should have regular check-up appointments with your healthcare provider that is prescribing your Eliquis .    What do you do if you miss a dose? If a dose of ELIQUIS  is not taken at the scheduled time, take it as soon as possible on the same day and twice-daily administration should be resumed. The dose should not be doubled to make up for a missed dose.  Important Safety Information A possible side effect of Eliquis  is bleeding. You should call your healthcare provider right away if you experience any of the following: Bleeding from an injury or your  nose that does not stop. Unusual colored urine (red or dark brown) or unusual colored stools (red or black). Unusual bruising for unknown reasons. A serious fall or if you hit your head (even if there is no bleeding).  Some medicines may interact with Eliquis  and might increase your risk of bleeding or clotting while on Eliquis . To help avoid this, consult your healthcare provider or pharmacist prior to using any new prescription or non-prescription medications, including herbals, vitamins, non-steroidal anti-inflammatory drugs (NSAIDs) and supplements.  This website has more information on Eliquis  (apixaban ): [email or web address] Romaine Closs    [facility] assistance programs Crisis assistance programs  -[facility] Coordinated Entry Program. If you are experiencing homelessness in [place], [place] , your first point of contact should be Pensions consultant. You can reach Coordinated Entry by calling ([phone] ([phone] or by emailing coordinatedentry@partnersendinghomelessness .org.  Community access points: [employer] [phone], [place]) every Tuesday from 9am-10am. [facility] ([name] [place]) every Wednesday from 8am-9am.   -[place] Coordinated Re-entry Jayson Michael: Dial 211 and request. Offers referrals to homeless shelters in the area.    -The [employer] 903-528-2357) offers several services to local families, as funding allows. The Emergency Assistance Program (EAP), which they administer, provides household goods, free food, clothing, and financial aid to people in need in the [place]  area. The EAP program does have some qualification, and counselors  will interview clients for financial assistance by written referral only. Referrals need to be made by the Department of Social Services or by other EAP approved human services agencies or charities in the area.  -[facility] of [employer],  which can be reached at [phone], offers emergency assistance programs for those in need of help, such as food, rent assistance, a soup kitchen, shelter, and clothing. They are based in [place]  but provide a number of services to those that qualify for assistance.   [facility] Commun[facility] Department of Social Services may be able to offer temporary financial assistance and cash grants for paying rent and utilities, Help may be provided for local county residents who may be experiencing personal crisis when other resources, including government programs, are not available. Call [phone]  -High [employer] is a [employer] agency, The organization can offer emergency assistance for paying rent, Care[employer] Rx, utilities, food, household products and furniture. They offer extensive emergency and transitional housing for families, children and single women, and also run a Boy's and [employer]. Thrift Shops, Secondary school teacher, and other aid offered too. [address] [place], [place]  [address], [phone]  -[place] Low Income Energy Assistance Program -- This is offered for [facility] families. The federal government created [employer] Program provides a one-time cash grant payment to help eligible low-income families pay their electric and heating bills. [address] [place], [place]  [address], [phone]  -[place] Emergency Assistance -- A program offers emergency utility and rent funds for greater [employer] area residents. The program can also provide counseling and referrals to charities and government programs. Also provides food and a free meal program that serves lunch Mondays - Saturdays and dinner seven days per week to individuals in the community. [address], [place], [place]  [address], [phone]  -[employer] - Offers affordable apartment and housing  communities across      [place] and [place]. The low income and seniors can access public housing, rental assistance to qualified applicants, and apply for the section 8 rent subsidy program. Other programs include Chiropractor and Engineer, maintenance. [address], [place], [place]  [address], dial [phone].  -The [facility] provides transitional housing to veterans and the disabled. Clients will also access other services too, including assistance in applying for Disability, life skills classes, case management, and assistance in finding permanent housing. [address], [place], [place]  [address], call [phone]  -[place] [place] Transitional Housing through [employer] is for people who were just evicted or that are formerly homeless. The non-profit will also help then gain self-sufficiency, find a home or apartment to live in, and also provides information on rent assistance when needed. Phone [phone]  -The [place] [facility] [facility] helps low income, elderly, or disabled residents in seven counties in the [place] ([place], [place], [place], [place], [place], [place], [place], and [place]) save energy and reduce their utility bills by improving energy efficiency. Phone [phone].  -[employer] is located in the [place] [facility] in the [employer], [address], [place], [place] [address]. Parking is in the rear of the building. Phone: [phone]   General Email: info@gsohc .org  [facility]HC provides free housing counseling assistance in locating affordable rental housing or housing with support services for families and individuals in crisis and the chronically homeless. We  provide potential resources for other housing needs like utilities. Our trained counselors also work with clients on budgeting and  financial literacy in effort to empower them to take control of their financial situations. [employer] collaborates with homeless service providers and other stakeholders as part of the [employer] COC (Continuum of Care). The (COC) is a regional/local planning body that coordinates housing and services funding for homeless families and individuals. The role of [facility] in the COC is through housing counseling to work with people we serve on diversion strategies for those that are at imminent risk of becoming homeless. We also work with the Coordinated Assessment/Entry Specialist who attempts to find temporary solutions and/or connects the people to Housing First, Rapid Re-housing or transitional housing programs. Our Homelessness Prevention Housing Counselors meet with clients on business days (Monday-Fridays, except scheduled holidays) from 8:30 am to 4:30 pm.  Legal assistance for evictions, foreclosure, and more -If you need free legal advice on civil issues, such as foreclosures, evictions, Electronics engineer, government programs, domestic issues and more, Landscape architect of [place]  [facility]) is a Associate Professor firm that provides free legal services and counsel to lower income people, seniors, disabled, and others, The goal is to ensure everyone has access to justice and fair representation. Call them at [phone].  [facility] for Housing and Community Studies can provide info about obtaining legal assistance with evictions. Phone [phone].  Data processing manager  The [employer], [employer]. offers job and Dispensing optician. Resources are focused on helping students obtain the skills and experiences that are necessary to compete in today's challenging and tight job market. The non-profit faith-based community action agency offers internship trainings as well as classroom instruction. Classes are tailored to meet the needs of people in the [place]  region. [place], [place] [address], [phone]  Foreclosure prevention/Debt Services Family Services of the [employer] Credit Counseling Service inludes debt and foreclosure prevention programs for local families. This includes money management, financial advice, budget review and development of a written action plan with a Pensions consultant to help solve specific individual financial problems. In addition, housing and mortgage counselors can also provide pre- and post-purchase homeownership counseling, default resolution counseling (to prevent foreclosure) and reverse mortgage counseling. A Debt Management Program allows people and families with a high level of credit card or medical debt to consolidate and repay consumer debt and loans to creditors and rebuild positive credit ratings and scores. Contact (336) F1555895.  Community clinics in [place] -Health Department [facility]: [address] [place], [place], [address]. [phone].  -Health Department [place] [facility]: [address], [facility], [address]. [phone].  -[facility] offers medical care through a group of doctors, pharmacies and other healthcare related agencies that offer services for low income, uninsured adults in [place]. Also offers adult Dental care and assistance with applying for an [employer]. Call [phone].   [facility]. This center provides low-cost health care to those without health insurance. Services offered include an onsite pharmacy. Phone [phone]. [address], [place].  -Medication Assistance Program serves as a link between pharmaceutical companies and patients to provide low cost or free prescription medications. This service is available for residents who meet certain income restrictions and have no insurance coverage. PLEASE CALL [phone]  Jonette Nestle) OR [phone] ([place])  -One Step Further: [employer], [employer]  Support & Nutrition Program, [employer]. Call ([phone]/ [phone].  Food pantry and assistance -[facility]: [address], [place] [address]. Phone [phone]  -[facility] Food [facility]: [address], [place], [place] [address]. [phone].  -Missionary Ministry: has the purpose of visiting the sick and shut-ins and provide for needs in the surrounding communities. Call ([phone]. Email: stpaulbcinc@gmail .com This program provides: Food box for seniors, Financial assistance, Food to meet basic nutritional needs.  -Meals on Wheels with Senior Resources: [facility] residents age 25 and over who are homebound and unable to obtain and prepare a nutritious meal for themselves are eligible for this service. There may be a waiting list in certain parts of [facility] if the route in that area is full. If you are in [name] [place] and [place] call [phone] to register. For all other areas call ([phone] to register.  -Greater Dietitian: https://findfood.BargainContractor.si  TRANSPORTATION: -[employer] Department of Health: Call [facility] and [employer] at [phone] for details. AttractionGuides.es  -Access [place]: Access [place] is the [employer] Agency's shared-ride transportation service for eligible riders who have a disability that prevents them from riding the fixed route bus. Call [phone]. Access [place] riders must pay a fare of $1.50 per trip, or may purchase a 10-ride punch card for $14.00 ($1.40 per ride) or a 40-ride punch card for $48.00 ($1.20 per ride).  -The Shepherd's WHEELS rideshare transportation service is provided for senior citizens (60+) who live independently within [place]  city limits and are unable to drive or have limited access to transportation. Call ([phone] to schedule an appointment.  -[place] Transportation: For Medicare or Medicaid recipients call [phone]?[facility], wheelchair Carloyn [name], and ambulatory quotes available.     [place] 2-1-1 is another useful way to locate resources in the community. Visit ShedSizes.ch to find service information online. If you need additional assistance, 2-1-1 Referral Specialists are available 24 hours a day, every day by dialing 2-1-1 or [phone] from any phone. The call is free, confidential, and available in any language.  Affordable Housing Search http://www.nchousingsearch.[facility])   M-F 8a-3p [address]  [place], [place] [address] [phone] Services include: laundry, barbering, support groups, case management, phone & computer access, showers, AA/NA mtgs, mental health/substance abuse nurse, job skills class, disability information, VA assistance, spiritual classes, etc. Winter Shelter available when temperatures are less than 32 degrees.   HOMELESS SHELTERS [facility] at [facility]- Call [phone] ext. 347 or ext. 336. Located at [address], [place], [place] [address]  [facility] Mens Shelter- Call [phone]. Located at [address], [place] [address].  [facility] [employer]. Call [phone]. Office located at [address] [place] [address].  [facility] Family Housing through [employer] [phone].  [facility] Family Shelter- Call [phone]. Located at [address] [place], [place], [place] [address].  [facility] the [facility]-For Pregnant mothers. Call [phone]. Located at [address] [place], [address].  [facility]-For men in [place]. Call [phone]. [address]-Shelter in [place]. Call [phone].  Home of [employer] for [employer] [phone]. Office located at [address] [place], [address].  [employer] be agreeable to help with chores. Call [phone] ext. [phone].  Men's: [address] [place], [place] [address]. Women's: [facility]  [address]  [place], [place] [address]  Crisis Services [facility] Mobile Crisis Management- [phone]  [facility] [address], [place], [place] [address]. Phone: [phone]

## 2024-04-09 NOTE — TOC Benefit Eligibility Note (Signed)
 Pharmacy Patient Advocate Encounter  Insurance verification completed.    The patient is insured through [employer]. Patient has Medicare and is not eligible for a copay card, but may be able to apply for patient assistance or Medicare RX Payment Plan (Patient Must reach out to their plan, if eligible for payment plan), if available.    Ran test claim for Eliquis 5mg  and the current 30 day co-pay is $4.80.   This test claim was processed through [facility]- copay amounts may vary at other pharmacies due to pharmacy/plan contracts, or as the patient moves through the different stages of their insurance plan.

## 2024-04-10 LAB — BASIC METABOLIC PANEL WITH GFR
Anion gap: 10 (ref 5–15)
BUN: 20 mg/dL (ref 6–20)
CO2: 25 mmol/L (ref 22–32)
Calcium: 9.3 mg/dL (ref 8.9–10.3)
Chloride: 101 mmol/L (ref 98–111)
Creatinine, Ser: 0.53 mg/dL — ABNORMAL LOW (ref 0.61–1.24)
GFR, Estimated: >60 mL/min (ref >60)
Glucose, Bld: 105 mg/dL — ABNORMAL HIGH (ref 70–99)
Potassium: 4 mmol/L (ref 3.5–5.1)
Sodium: 136 mmol/L (ref 135–145)

## 2024-04-10 LAB — CBC
HCT: 38.7 % — ABNORMAL LOW (ref 39.0–52.0)
Hemoglobin: 12.9 g/dL — ABNORMAL LOW (ref 13.0–17.0)
MCH: 30.9 pg (ref 26.0–34.0)
MCHC: 33.3 g/dL (ref 30.0–36.0)
MCV: 92.6 fL (ref 80.0–100.0)
Platelets: 647 10*3/uL — ABNORMAL HIGH (ref 150–400)
RBC: 4.18 MIL/uL — ABNORMAL LOW (ref 4.22–5.81)
RDW: 14.3 % (ref 11.5–15.5)
WBC: 15.2 10*3/uL — ABNORMAL HIGH (ref 4.0–10.5)
nRBC: 0 % (ref 0.0–0.2)

## 2024-04-10 LAB — GLUCOSE, CAPILLARY
Glucose-Capillary: 103 mg/dL — ABNORMAL HIGH (ref 70–99)
Glucose-Capillary: 114 mg/dL — ABNORMAL HIGH (ref 70–99)
Glucose-Capillary: 126 mg/dL — ABNORMAL HIGH (ref 70–99)
Glucose-Capillary: 122 mg/dL — ABNORMAL HIGH (ref 70–99)
Glucose-Capillary: 134 mg/dL — ABNORMAL HIGH (ref 70–99)

## 2024-04-10 LAB — AMMONIA: Ammonia: 53 umol/L — ABNORMAL HIGH (ref 9–35)

## 2024-04-10 MED ORDER — JEVITY 1.5 CAL/FIBER PO LIQD
1000.0000 mL | ORAL | Status: DC
  Administered 2024-04-10 – 2024-04-12 (×3): 1000 mL
  Filled 2024-04-10 (×4): qty 1000

## 2024-04-10 MED ORDER — JEVITY 1.5 CAL/FIBER PO LIQD
1000.0000 mL | ORAL | Status: DC
  Filled 2024-04-10: qty 1000

## 2024-04-10 MED ORDER — BOOST / RESOURCE BREEZE PO LIQD CUSTOM
1.0000 | Freq: Three times a day (TID) | ORAL | Status: DC
  Administered 2024-04-10 – 2024-04-11 (×3): 1 via ORAL
  Administered 2024-04-12: 237 mL via ORAL
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
## 2024-04-10 NOTE — Plan of Care (Signed)
  Problem: Education: Goal: Knowledge of General Education information will improve Description: Including pain rating scale, medication(s)/side effects and non-pharmacologic comfort measures Outcome: Progressing   Problem: Health Behavior/Discharge Planning: Goal: Ability to manage health-related needs will improve Outcome: Progressing   Problem: Clinical Measurements: Goal: Ability to maintain clinical measurements within normal limits will improve Outcome: Progressing Goal: Will remain free from infection Outcome: Progressing Goal: Diagnostic test results will improve Outcome: Progressing Goal: Respiratory complications will improve Outcome: Progressing Goal: Cardiovascular complication will be avoided Outcome: Progressing   Problem: Nutrition: Goal: Adequate nutrition will be maintained Outcome: Progressing   Problem: Coping: Goal: Level of anxiety will decrease Outcome: Progressing   Problem: Activity: Goal: Risk for activity intolerance will decrease Outcome: Progressing   Problem: Pain Managment: Goal: General experience of comfort will improve and/or be controlled Outcome: Progressing   Problem: Safety: Goal: Ability to remain free from injury will improve Outcome: Progressing

## 2024-04-10 NOTE — Progress Notes (Signed)
 Calorie Count Note  Patient very agitated on visit. Is tired of eating the same things over and over again. States "I just want a steak, this is bullshit." RD educated pt on why he is on a FLD and pt responded "I can't do this anymore." Pt does not want to drink any supplements and wants real food. RD discussed why supplements are important during this time to meet his needs. Pt declined to drink them. Does eat some of the magic cups but is tired of ice cream and grits. NT attempted to get pt to drink an Ensure this morning but pt refused. Only had 25% of grits for Breakfast this morning.   Tube feeds are not affecting pt's appetite rather it is the diet itself that is causing his poor PO intake. Increase nocturnal TF rate to meet more of his estimated needs  Reviewed ENT notes and on 5/1 it was noted he needed to be on a strict liquid only diet for 4 weeks 5/29. Messaged Dr. Hildy Lowers via [facility], he is ok with leaving his cortrak in until his PO intake improves.   48 hour calorie count ordered.  Diet: FLD, Nectar Thick  Supplements: Ensure Enlive, Magic cups, Prosource Plus   Breakfast: 25% grits, 50 kcal,1 gm protein Lunch: 40% [place] yogurt, 40 kcal, 5 gm protein Dinner: 0%  Supplements: 0 Ensures, 1 PS, 0 Magic cups, 100 kcal, 15 gm protein  Total intake: 190 kcal (8% of minimum estimated needs)  21 gm protein (17% of minimum estimated needs)  Estimated Nutritional Needs:  Kcal:  2400-2600 Protein:  120-140 grams Fluid:  > 2L/day  INTERVENTION:   Continue nocturnal tube feeds Jevity 1.5 via Cortrak tube: (1800-0600) Increase Jevity 1.5 to 70 ml/hr (840 ml/day) Prosource TF20 60 ml BID 100 ml FWF Q8H    Provides 1,420 kcal (59% estimated needs), 94 gm protein (78% estimated needs) , and 638 ml water  (938 ml water  daily TF + FWF)   Encourage PO intake House trays  Feeding with assist as required - Discussed with nursing  48 hour calorie count  Continue Banatrol TID per tube   Trial Boost Breeze po TID, each supplement provides 250 kcal and 9 grams of protein Continue Magic cup TID with meals, each supplement provides 290 kcal and 9 grams of protein Continue Prosource Plus 30 ml PO BID, each packet contains 100 kcal, and 15 gm protein Discontinue Ensure Enlive-pt refusing    Pt is very limited on what he can have due to his current diet. Pt also is tired of the foods on his FLD. Noted that pt has been refusing supplements and having poor PO intake. Recommend to keep cortrak in place until pt meeting 60% of his needs.   NUTRITION DIAGNOSIS:    Increased nutrient needs related to  (trauma) as evidenced by estimated needs.   - Still applicable   GOAL:    Patient will meet greater than or equal to 90% of their needs   - Progressing   Hunter Cook, RD Registered Dietitian  See Amion for more information

## 2024-04-11 LAB — VALPROIC ACID LEVEL: Valproic Acid Lvl: <10 ug/mL — ABNORMAL LOW (ref 50–100)

## 2024-04-11 LAB — GLUCOSE, CAPILLARY
Glucose-Capillary: 115 mg/dL — ABNORMAL HIGH (ref 70–99)
Glucose-Capillary: 116 mg/dL — ABNORMAL HIGH (ref 70–99)
Glucose-Capillary: 121 mg/dL — ABNORMAL HIGH (ref 70–99)
Glucose-Capillary: 125 mg/dL — ABNORMAL HIGH (ref 70–99)
Glucose-Capillary: 139 mg/dL — ABNORMAL HIGH (ref 70–99)
Glucose-Capillary: 99 mg/dL (ref 70–99)

## 2024-04-11 LAB — AMMONIA: Ammonia: 45 umol/L — ABNORMAL HIGH (ref 9–35)

## 2024-04-11 MED ORDER — DIVALPROEX SODIUM 500 MG PO DR TAB
500.0000 mg | DELAYED_RELEASE_TABLET | Freq: Two times a day (BID) | ORAL | Status: DC
  Administered 2024-04-11 – 2024-04-14 (×5): 500 mg via ORAL
  Filled 2024-04-11 (×6): qty 1

## 2024-04-11 NOTE — Progress Notes (Signed)
 Received new consult; will assess and evaluate this patient on Monday. In the meantime, recommend maintaining 1:1 observation and suicide precautions. Orders placed by this provider. Patient is currently prescribed multiple medications targeting a broad range of psychiatric symptoms. Given the significant stressors over the past five weeks, demoralization syndrome should be considered. Outside of therapy, he is receiving optimal pharmacologic support during this inpatient stay. Will assess readiness for trauma therapies, and need for inpatient psychiatric hospitalization once medically stable.
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
## 2024-04-12 LAB — GLUCOSE, CAPILLARY: Glucose-Capillary: 110 mg/dL — ABNORMAL HIGH (ref 70–99)

## 2024-04-12 NOTE — Plan of Care (Signed)
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
## 2024-04-12 NOTE — Plan of Care (Signed)
  Problem: Education: Goal: Knowledge of General Education information will improve Description: Including pain rating scale, medication(s)/side effects and non-pharmacologic comfort measures Outcome: Progressing   Problem: Clinical Measurements: Goal: Respiratory complications will improve Outcome: Progressing   Problem: Activity: Goal: Risk for activity intolerance will decrease Outcome: Progressing   Problem: Elimination: Goal: Will not experience complications related to bowel motility Outcome: Progressing   Problem: Pain Managment: Goal: General experience of comfort will improve and/or be controlled Outcome: Progressing   Problem: Safety: Goal: Ability to remain free from injury will improve Outcome: Progressing

## 2024-04-13 LAB — GLUCOSE, CAPILLARY
Glucose-Capillary: 91 mg/dL (ref 70–99)
Glucose-Capillary: 111 mg/dL — ABNORMAL HIGH (ref 70–99)

## 2024-04-13 MED ORDER — JUVEN PO PACK: 1.0000 | PACK | Freq: Two times a day (BID) | ORAL | Status: DC

## 2024-04-13 MED ORDER — JEVITY 1.5 CAL/FIBER PO LIQD
1000.0000 mL | ORAL | Status: DC
  Administered 2024-04-13: 1000 mL
  Filled 2024-04-13 (×2): qty 1000

## 2024-04-13 NOTE — Progress Notes (Signed)
   04/13/24 2029  Assess: MEWS Score  Temp 97.9 F (36.6 C)  BP (!) 137/92 (RN Notified)  MAP (mmHg) 105  Pulse Rate (!) 122 (RN Notified)  Resp 20  SpO2 92 %  O2 Device Room Air  Assess: MEWS Score  MEWS Temp 0  MEWS Systolic 0  MEWS Pulse 2  MEWS RR 0  MEWS LOC 0  MEWS Score 2  MEWS Score Color Yellow  Assess: if the MEWS score is Yellow or Red  Were vital signs accurate and taken at a resting state? Yes  Does the patient meet 2 or more of the SIRS criteria? Yes  Does the patient have a confirmed or suspected source of infection? No  MEWS guidelines implemented  Yes, yellow  Treat  MEWS Interventions Considered administering scheduled or prn medications/treatments as ordered  Take Vital Signs  Increase Vital Sign Frequency  Yellow: Q2hr x1, continue Q4hrs until patient remains green for 12hrs  Escalate  MEWS: Escalate Yellow: Discuss with charge nurse and consider notifying provider and/or RRT  Notify: Charge Nurse/RN  Name of Charge Nurse/RN Notified Mary, RN  Assess: SIRS CRITERIA  SIRS Temperature  0  SIRS Respirations  0  SIRS Pulse 1  SIRS WBC 0  SIRS Score Sum  1

## 2024-04-13 NOTE — Consult Note (Signed)
 WOC Nurse wound follow up Pt was dizzy and confuse. He couldn't move by himself without help.  Wound type: DTPI on sacrum, close to the rectum. Measurement: 1 cm x 1 cm  Wound bed: open wound 0.5 x 0.5 cm 100% red. Drainage (amount, consistency, odor) Minimum, the pt was not using a dressing. Periwound: dark/purple skin, intact. Dressing procedure/placement/frequency: Apply foam dressing into the sacrum area, change every 3 days or PRN.  WOC team will follow weekly.  Please reconsult if further assistance is needed. Thank-you,  Rachel Budds BSN, RN, [employer], WOC  (Pager: [phone])

## 2024-04-13 NOTE — Progress Notes (Signed)
 RN notified by lab technician that patient refused lab work this am.  Will try again later.
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
## 2024-04-13 NOTE — Progress Notes (Signed)
 CSW filed IVC paperwork, envelope [id number], case number #[id number],  Trauma LCSW made copies of paperwork and placed them on patient's chart. Patient to be served by [employer].  Shepard Dicker, MSW, LCSW Transitions of Care  Clinical Social Worker II [phone]

## 2024-04-13 NOTE — Progress Notes (Signed)
 Patient refusing to apply the new aspen collar that was ordered, due to the other one being "smelly and gross."  Pt oxygen saturation at 90% but refusing supplemental oxygen at this time.
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
## 2024-04-14 DIAGNOSIS — T1490XA Injury, unspecified, initial encounter: Secondary | ICD-10-CM | POA: Diagnosis not present

## 2024-04-14 DIAGNOSIS — E7220 Disorder of urea cycle metabolism, unspecified: Secondary | ICD-10-CM

## 2024-04-14 DIAGNOSIS — R451 Restlessness and agitation: Secondary | ICD-10-CM | POA: Diagnosis not present

## 2024-04-14 DIAGNOSIS — S069XAA Unspecified intracranial injury with loss of consciousness status unknown, initial encounter: Secondary | ICD-10-CM

## 2024-04-14 DIAGNOSIS — F0630 Mood disorder due to known physiological condition, unspecified: Secondary | ICD-10-CM

## 2024-04-14 DIAGNOSIS — R4589 Other symptoms and signs involving emotional state: Secondary | ICD-10-CM

## 2024-04-14 LAB — GLUCOSE, CAPILLARY
Glucose-Capillary: 97 mg/dL (ref 70–99)
Glucose-Capillary: 110 mg/dL — ABNORMAL HIGH (ref 70–99)
Glucose-Capillary: 145 mg/dL — ABNORMAL HIGH (ref 70–99)

## 2024-04-14 LAB — AMMONIA: Ammonia: 36 umol/L — ABNORMAL HIGH (ref 9–35)

## 2024-04-14 MED ORDER — JUVEN PO PACK
1.0000 | PACK | Freq: Two times a day (BID) | ORAL | Status: DC
  Administered 2024-04-15 – 2024-04-16 (×4): 1 via ORAL
  Filled 2024-04-14 (×5): qty 1

## 2024-04-14 MED ORDER — BUSPIRONE HCL 5 MG PO TABS
15.0000 mg | ORAL_TABLET | Freq: Two times a day (BID) | ORAL | Status: DC
  Administered 2024-04-14 – 2024-04-21 (×13): 15 mg via ORAL
  Filled 2024-04-14 (×15): qty 3

## 2024-04-14 MED ORDER — LITHIUM CARBONATE 150 MG PO CAPS
150.0000 mg | ORAL_CAPSULE | Freq: Two times a day (BID) | ORAL | Status: DC
  Administered 2024-04-14 – 2024-04-17 (×7): 150 mg via ORAL
  Filled 2024-04-14 (×8): qty 1

## 2024-04-14 NOTE — Plan of Care (Signed)
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
## 2024-04-14 NOTE — Plan of Care (Signed)
   Problem: Education: Goal: Knowledge of General Education information will improve Description Including pain rating scale, medication(s)/side effects and non-pharmacologic comfort measures Outcome: Progressing

## 2024-04-14 NOTE — Progress Notes (Addendum)
 Pt agitated because he believes he is having surgery on his sacral wound today and is mad because RN is infusing tube feed before his procedure and that "will ruin everything."  RN not aware of any procedures/surgeries scheduled for today.  RN explained wound on buttocks and wound care nurses note about treatment.  Pt  then picked up the bedside table and flipped it over.

## 2024-04-14 NOTE — Progress Notes (Signed)
 Patient refused lab draw this morning.  Pt educated by RN, pt still adamantly refused.
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
## 2024-04-14 NOTE — Progress Notes (Signed)
 Waiting for hydrogel from material management to change wound at this time. Patient updated.

## 2024-04-15 DIAGNOSIS — F319 Bipolar disorder, unspecified: Secondary | ICD-10-CM | POA: Insufficient documentation

## 2024-04-15 DIAGNOSIS — T1490XA Injury, unspecified, initial encounter: Secondary | ICD-10-CM | POA: Diagnosis not present

## 2024-04-15 DIAGNOSIS — R451 Restlessness and agitation: Secondary | ICD-10-CM | POA: Diagnosis not present

## 2024-04-15 DIAGNOSIS — E7220 Disorder of urea cycle metabolism, unspecified: Secondary | ICD-10-CM | POA: Diagnosis not present

## 2024-04-15 MED ORDER — DISULFIRAM 250 MG PO TABS
125.0000 mg | ORAL_TABLET | Freq: Every day | ORAL | Status: DC
  Filled 2024-04-15 (×3): qty 1

## 2024-04-15 MED ORDER — LACTULOSE 10 GM/15ML PO SOLN
30.0000 g | Freq: Two times a day (BID) | ORAL | Status: DC
  Administered 2024-04-15 – 2024-04-19 (×3): 30 g via ORAL
  Filled 2024-04-15 (×14): qty 45

## 2024-04-15 MED ORDER — AMANTADINE HCL 50 MG/5ML PO SOLN
50.0000 mg | Freq: Every day | ORAL | Status: AC
  Administered 2024-04-16: 50 mg via ORAL
  Filled 2024-04-15 (×2): qty 5

## 2024-04-15 MED ORDER — QUETIAPINE FUMARATE 200 MG PO TABS
800.0000 mg | ORAL_TABLET | Freq: Every day | ORAL | Status: DC
  Administered 2024-04-16 – 2024-04-20 (×5): 800 mg via ORAL
  Filled 2024-04-15 (×2): qty 4
  Filled 2024-04-15 (×2): qty 8
  Filled 2024-04-15 (×3): qty 4
  Filled 2024-04-15: qty 8
  Filled 2024-04-15: qty 4
  Filled 2024-04-15: qty 8
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
## 2024-04-15 NOTE — Care Management Important Message (Signed)
 Important Message  Patient Details  Name: Hunter Cook MRN: [id number] Date of Birth: December 09, 1995   Important Message Given:  Yes - Medicare IM     Felix Host 04/15/2024, 1:52 PM

## 2024-04-15 NOTE — Progress Notes (Signed)
 Pt was sitting in chair went to get up when I looked to see pt was on floor no bleeding noted vitals signs with in normal limits. Pt c/o sob  O2 given via nasal canula for comfort, pt also c/o right side of neck pain and right shoulder and right ankle pain. MD was inform order placed for xray. Will continue to monitor pt

## 2024-04-16 ENCOUNTER — Inpatient Hospital Stay (HOSPITAL_COMMUNITY)

## 2024-04-16 MED ORDER — DIAZEPAM 5 MG PO TABS
10.0000 mg | ORAL_TABLET | Freq: Three times a day (TID) | ORAL | Status: DC
  Administered 2024-04-16 – 2024-04-17 (×2): 10 mg via ORAL
  Filled 2024-04-16 (×2): qty 2

## 2024-04-16 MED ORDER — HYDROMORPHONE HCL 1 MG/ML IJ SOLN
1.0000 mg | Freq: Once | INTRAMUSCULAR | Status: AC
  Administered 2024-04-16: 1 mg via INTRAVENOUS
  Filled 2024-04-16: qty 1

## 2024-04-16 NOTE — TOC Progression Note (Addendum)
 Transition of Care [facility]) - Progression Note    Patient Details  Name: Hunter Cook MRN: [id number] Date of Birth: Apr 03, 1996  [facility] CM/SW Contact  Randell Bussing, LCSW Phone Number: 04/16/2024, 12:00 PM  Clinical Narrative:    Inpatient Psych referrals sent  [facility] Pending - Request Sent  [facility] Pending - Request Sent  [facility] Pending - Request [facility] Pending - Request Sent  [facility] Patient Placement Pending - Request Sent  [facility] Pending - Request Sent  [facility] Pending - Request Sent  [facility] Pending - Request [facility] Pending - Request Sent  [facility] Pending - Request Sent  [facility] Pending - Request Sent  [facility] Pending - Request Sent  [facility] Pending - Request Sent  [facility] Pending - Request Sent  [facility] Pending - Request Sent  [facility] Pending - Request Sent  [facility] Pending - Request Sent  [facility] Pending - Request Sent  [facility] Pending - Request [facility] Pending - Request Sent  [facility] IDD [facility] Pending - Request Sent    1:00- -[facility] - spoke with Avanell Bob, she stated it has not been reviewed yet and will not be reviewed until tomorrow. She advised to call back tomorrow.  -[facility] - spoke with Arlyce Lambert, they do not have enough beds, states we can call back tomorrow and they may reconsider. Arlyce Lambert states they likely cannot accept "due to TBI, alcohol use, hx of poor decision making but this would ultimately be up to the doctor". He  states they will need specific information about what level of assistance patient needs with ADLs.  [facility] - referral faxed. Called and spoke with Cornel Diesel. Cornel Diesel confirmed they received the referral, the doctors will review referral and then patient will be added to the waitlist if appropriate.   Expected Discharge Plan: Skilled Nursing Facility Barriers to Discharge: Continued Medical Work up  Expected Discharge Plan and Services   Discharge Planning Services: CM Consult                                           Social Determinants of Health (SDOH) Interventions SDOH Screenings   Food Insecurity: Patient Unable To Answer (03/03/2024)  Housing: Patient Unable To Answer (03/11/2024)  Transportation Needs: Patient Unable To Answer (03/03/2024)  Utilities: Patient Unable To Answer (03/03/2024)    Readmission Risk Interventions     No data to display
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
## 2024-04-17 ENCOUNTER — Inpatient Hospital Stay (HOSPITAL_COMMUNITY)

## 2024-04-17 ENCOUNTER — Telehealth (INDEPENDENT_AMBULATORY_CARE_PROVIDER_SITE_OTHER): Payer: Self-pay | Admitting: Physician Assistant

## 2024-04-17 LAB — GLUCOSE, CAPILLARY: Glucose-Capillary: 98 mg/dL (ref 70–99)

## 2024-04-17 MED ORDER — HALOPERIDOL LACTATE 5 MG/ML IJ SOLN
5.0000 mg | Freq: Three times a day (TID) | INTRAMUSCULAR | Status: DC | PRN
  Administered 2024-04-18 (×2): 5 mg via INTRAMUSCULAR
  Filled 2024-04-17 (×2): qty 1

## 2024-04-17 MED ORDER — HALOPERIDOL 5 MG PO TABS
5.0000 mg | ORAL_TABLET | Freq: Three times a day (TID) | ORAL | Status: DC | PRN
  Filled 2024-04-17: qty 1

## 2024-04-17 MED ORDER — DIAZEPAM 5 MG PO TABS
5.0000 mg | ORAL_TABLET | Freq: Four times a day (QID) | ORAL | Status: DC
  Administered 2024-04-17 – 2024-04-18 (×2): 5 mg via ORAL
  Filled 2024-04-17 (×2): qty 1

## 2024-04-17 NOTE — Telephone Encounter (Signed)
 The patient has called our office multiple times today, cussing at our staff and hanging up the phone.  I reached out to him to see if I can provide any help with the situation.  He proceeded to use explicit language directed at myself and our staff.  I reiterated the treatment plan including his current diet, we will be evaluating him next week and make changes as needed at that point.  I did advise him that our staff would not tolerate his language and I have advised our office staff to not engage in conversation with him.  If he has any issue he is currently hospitalized and hospital staff can reach out to us  directly.

## 2024-04-17 NOTE — Telephone Encounter (Signed)
 Patient called in stating that he is very upset and "starving".  Still in the hospital and is complaining of being on a liquid / puree diet.  I informed him he needed to speak with the attending physician.  I then went and spoke to [facility], and he did see where Dr. Lydia Sams had put the patient on a liquid diet on 03/26/2024 for 4 weeks.  I called the patient back to let him know of the orders and Patient is extremely upset and requests someone to call him.

## 2024-04-17 NOTE — Progress Notes (Signed)
 Pt refused dose of valium  and lithium. MD [facility] notified via secure chat.

## 2024-04-17 NOTE — Progress Notes (Signed)
 Patient is still refusing bladder scan however; did ambulate to the bathroom and voided yellow, clear urine.

## 2024-04-17 NOTE — Progress Notes (Signed)
 Pt back on the unit from [facility].

## 2024-04-17 NOTE — Progress Notes (Signed)
 PT Cancellation Note  Patient Details Name: Hunter Cook MRN: [id number] DOB: [birth year]   Cancelled Treatment:    Reason Eval/Treat Not Completed: Patient declined, no reason specified. Pt refuses mobilization at this time, reporting he does not feel up to it. Pt expresses further complaints regarding his pureed diet. PT will attempt to follow up next week.   Rexie Catena 04/17/2024, 2:51 PM
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
## 2024-04-17 NOTE — Plan of Care (Signed)

## 2024-04-17 NOTE — Progress Notes (Signed)
 Pt off the unit for MBS.

## 2024-04-17 NOTE — Progress Notes (Signed)
 Pt hasn't voided all shift and has refused bladder scan. Refused vitals signs as well.
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
## 2024-04-17 NOTE — TOC Progression Note (Addendum)
 Transition of Care [facility]) - Progression Note    Patient Details  Name: Dhanvin Szeto MRN: [id number] Date of Birth: [birth year]  [facility] CM/SW Contact  Hatley Henegar E Ryli Standlee, LCSW Phone Number: 04/17/2024, 11:50 AM  Clinical Narrative:    Inpatient Psych placement pending. Called the following facilities as recommended by psych. [facility] Hagedorn - called and spoke with Claudis Cumber, he requests the referral be faxed, faxed to [phone]. -[facility] -  called and spoke with Arlyce Lambert, he states they do not have any beds. -[facility] - called and spoke with Ladyvan, completed demographic screening   Expected Discharge Plan: Skilled Nursing Facility Barriers to Discharge: Continued Medical Work up  Expected Discharge Plan and Services   Discharge Planning Services: CM Consult                                           Social Determinants of Health (SDOH) Interventions SDOH Screenings   Food Insecurity: Patient Unable To Answer (03/03/2024)  Housing: Patient Unable To Answer (03/11/2024)  Transportation Needs: Patient Unable To Answer (03/03/2024)  Utilities: Patient Unable To Answer (03/03/2024)    Readmission Risk Interventions     No data to display

## 2024-04-18 DIAGNOSIS — F319 Bipolar disorder, unspecified: Secondary | ICD-10-CM | POA: Diagnosis not present

## 2024-04-18 LAB — GLUCOSE, CAPILLARY: Glucose-Capillary: 122 mg/dL — ABNORMAL HIGH (ref 70–99)

## 2024-04-18 MED ORDER — DIAZEPAM 2 MG PO TABS
4.0000 mg | ORAL_TABLET | Freq: Three times a day (TID) | ORAL | Status: DC
  Administered 2024-04-18 – 2024-04-19 (×3): 4 mg via ORAL
  Filled 2024-04-18 (×3): qty 2

## 2024-04-18 MED ORDER — LITHIUM CARBONATE 300 MG PO CAPS
300.0000 mg | ORAL_CAPSULE | Freq: Every day | ORAL | Status: DC
  Administered 2024-04-18 – 2024-04-20 (×3): 300 mg via ORAL
  Filled 2024-04-18 (×4): qty 1

## 2024-04-18 MED ORDER — LITHIUM CARBONATE 150 MG PO CAPS
150.0000 mg | ORAL_CAPSULE | Freq: Every morning | ORAL | Status: DC
  Administered 2024-04-18 – 2024-04-21 (×4): 150 mg via ORAL
  Filled 2024-04-18 (×5): qty 1
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
## 2024-04-19 ENCOUNTER — Inpatient Hospital Stay (HOSPITAL_COMMUNITY)

## 2024-04-19 DIAGNOSIS — F319 Bipolar disorder, unspecified: Secondary | ICD-10-CM | POA: Diagnosis not present

## 2024-04-19 LAB — GLUCOSE, CAPILLARY
Glucose-Capillary: 109 mg/dL — ABNORMAL HIGH (ref 70–99)
Glucose-Capillary: 108 mg/dL — ABNORMAL HIGH (ref 70–99)

## 2024-04-19 MED ORDER — IPRATROPIUM-ALBUTEROL 0.5-2.5 (3) MG/3ML IN SOLN
3.0000 mL | Freq: Once | RESPIRATORY_TRACT | Status: AC
  Administered 2024-04-19: 3 mL via RESPIRATORY_TRACT

## 2024-04-19 MED ORDER — DIAZEPAM 2 MG PO TABS
3.0000 mg | ORAL_TABLET | Freq: Three times a day (TID) | ORAL | Status: DC
  Administered 2024-04-19 – 2024-04-20 (×3): 3 mg via ORAL
  Filled 2024-04-19 (×3): qty 2
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
## 2024-04-20 ENCOUNTER — Inpatient Hospital Stay (HOSPITAL_COMMUNITY)

## 2024-04-20 DIAGNOSIS — T1490XA Injury, unspecified, initial encounter: Secondary | ICD-10-CM | POA: Diagnosis not present

## 2024-04-20 LAB — VALPROIC ACID LEVEL: Valproic Acid Lvl: <10 ug/mL — ABNORMAL LOW (ref 50–100)

## 2024-04-20 MED ORDER — DIAZEPAM 2 MG PO TABS
2.0000 mg | ORAL_TABLET | Freq: Two times a day (BID) | ORAL | Status: DC
  Administered 2024-04-20 – 2024-04-21 (×2): 2 mg via ORAL
  Filled 2024-04-20 (×2): qty 1

## 2024-04-20 MED ORDER — IPRATROPIUM-ALBUTEROL 0.5-2.5 (3) MG/3ML IN SOLN
3.0000 mL | Freq: Once | RESPIRATORY_TRACT | Status: AC
  Administered 2024-04-20: 3 mL via RESPIRATORY_TRACT

## 2024-04-20 NOTE — TOC CM/SW Note (Signed)
 Uploaded Notice of Change in Commitment Recommendation Form completed by Psych NP into [employer]. Envelope #[id number]  Arthi Mcdonald, LCSW Transitions of Care Department [phone]
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
## 2024-04-20 NOTE — Progress Notes (Signed)
 Pharmacy sent 4mg  of valium  through tube station d/t pyxis not having sufficient quantity, dose ordered is 3mg , 1mg /0.5 tab wasted with Ami Kail, RN in stericycle
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
## 2024-04-21 ENCOUNTER — Other Ambulatory Visit (HOSPITAL_COMMUNITY): Payer: Self-pay

## 2024-04-21 MED ORDER — ACETAMINOPHEN 500 MG PO TABS: 1000.0000 mg | ORAL_TABLET | Freq: Four times a day (QID) | ORAL | Status: AC | PRN

## 2024-04-21 MED ORDER — ADULT MULTIVITAMIN W/MINERALS CH
1.0000 | ORAL_TABLET | Freq: Every day | ORAL | Status: DC
  Administered 2024-04-21: 1 via ORAL
  Filled 2024-04-21: qty 1

## 2024-04-21 MED ORDER — VITAMIN C 500 MG PO TABS
500.0000 mg | ORAL_TABLET | Freq: Every day | ORAL | Status: DC
  Administered 2024-04-21: 500 mg via ORAL
  Filled 2024-04-21: qty 1

## 2024-04-21 MED ORDER — DILTIAZEM HCL 60 MG PO TABS
60.0000 mg | ORAL_TABLET | Freq: Three times a day (TID) | ORAL | 0 refills | Status: AC
  Filled 2024-04-21: qty 90, 30d supply, fill #0

## 2024-04-21 MED ORDER — OXYCODONE HCL 10 MG PO TABS
10.0000 mg | ORAL_TABLET | Freq: Four times a day (QID) | ORAL | 0 refills | Status: DC | PRN
  Filled 2024-04-21: qty 30, 5d supply, fill #0

## 2024-04-21 MED ORDER — ZINC SULFATE 220 (50 ZN) MG PO CAPS
220.0000 mg | ORAL_CAPSULE | Freq: Every day | ORAL | Status: DC
  Administered 2024-04-21: 220 mg via ORAL
  Filled 2024-04-21: qty 1

## 2024-04-21 MED ORDER — LITHIUM CARBONATE 300 MG PO CAPS
300.0000 mg | ORAL_CAPSULE | Freq: Every day | ORAL | 0 refills | Status: AC
  Filled 2024-04-21: qty 30, 30d supply, fill #0

## 2024-04-21 MED ORDER — ZINC SULFATE 220 (50 ZN) MG PO TABS
220.0000 mg | ORAL_TABLET | Freq: Every day | ORAL | 0 refills | Status: AC
  Filled 2024-04-21: qty 15, 15d supply, fill #0

## 2024-04-21 MED ORDER — APIXABAN 5 MG PO TABS
5.0000 mg | ORAL_TABLET | Freq: Two times a day (BID) | ORAL | 2 refills | Status: DC
  Filled 2024-04-21: qty 60, 30d supply, fill #0

## 2024-04-21 MED ORDER — BUSPIRONE HCL 15 MG PO TABS
15.0000 mg | ORAL_TABLET | Freq: Two times a day (BID) | ORAL | 0 refills | Status: AC
  Filled 2024-04-21: qty 60, 30d supply, fill #0

## 2024-04-21 MED ORDER — LITHIUM CARBONATE 150 MG PO CAPS
150.0000 mg | ORAL_CAPSULE | Freq: Every morning | ORAL | 0 refills | Status: AC
  Filled 2024-04-21: qty 30, 30d supply, fill #0

## 2024-04-21 MED ORDER — METOPROLOL TARTRATE 100 MG PO TABS
100.0000 mg | ORAL_TABLET | Freq: Two times a day (BID) | ORAL | 0 refills | Status: AC
  Filled 2024-04-21: qty 60, 30d supply, fill #0

## 2024-04-21 MED ORDER — DIAZEPAM 2 MG PO TABS
2.0000 mg | ORAL_TABLET | Freq: Two times a day (BID) | ORAL | 0 refills | Status: DC
  Filled 2024-04-21: qty 4, 2d supply, fill #0

## 2024-04-21 MED ORDER — DIAZEPAM 2 MG PO TABS
1.0000 mg | ORAL_TABLET | Freq: Two times a day (BID) | ORAL | 0 refills | Status: AC
  Filled 2024-04-21: qty 7, 7d supply, fill #0

## 2024-04-21 MED ORDER — QUETIAPINE FUMARATE 400 MG PO TABS
800.0000 mg | ORAL_TABLET | Freq: Every day | ORAL | 0 refills | Status: AC
  Filled 2024-04-21: qty 60, 30d supply, fill #0

## 2024-04-21 NOTE — Progress Notes (Addendum)
 Order to discharge patient home.  Discharge instructions/AVS given to and reviewed with patient. Education provided as needed . Patient verbalized understanding of the discharge instructions. 1 PIV removed by the this RN. DME ([facility])delivered to the room and patient took with them. Patient refused to take home back brace at discharge. Personal belongings sent home with the patient. Patient picked up meds from the [facility] pharmacy. Home via private vehicle.

## 2024-04-21 NOTE — Consult Note (Signed)
 WOC Nurse wound follow up Refer to previous WOC consult notes on 5/19.  Pt is alert and states the previously noted Deep tissue pressure injury near his rectum was actually from a previous pilonidal cyst which was drained and healed, it was not a pressure injury.  Location is pink dry scar tissue. No further need for topical treatment.  Please re-consult if further assistance is needed.  Thank-you,  Wiliam Harder MSN, RN, CWOCN, [place], CNS [phone]
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
## 2024-04-21 NOTE — Progress Notes (Signed)
 Patient is not able to walk the distance required to go the bathroom, or he/she is unable to safely negotiate stairs required to access the bathroom.  A BSC will alleviate this problem.
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
## 2024-04-22 ENCOUNTER — Encounter: Payer: Self-pay | Admitting: Surgery

## 2024-04-22 ENCOUNTER — Encounter (HOSPITAL_COMMUNITY): Payer: Self-pay | Admitting: *Deleted

## 2024-04-22 ENCOUNTER — Other Ambulatory Visit: Payer: Self-pay

## 2024-04-22 ENCOUNTER — Emergency Department (HOSPITAL_COMMUNITY)
Admission: EM | Admit: 2024-04-22 | Discharge: 2024-04-23 | Disposition: A | Discharge status: Home / Self Care | Attending: Emergency Medicine | Admitting: Emergency Medicine

## 2024-04-22 DIAGNOSIS — Z7901 Long term (current) use of anticoagulants: Secondary | ICD-10-CM | POA: Insufficient documentation

## 2024-04-22 DIAGNOSIS — F17210 Nicotine dependence, cigarettes, uncomplicated: Secondary | ICD-10-CM | POA: Insufficient documentation

## 2024-04-22 DIAGNOSIS — M79604 Pain in right leg: Secondary | ICD-10-CM | POA: Diagnosis present

## 2024-04-22 MED ORDER — ACETAMINOPHEN 325 MG PO TABS
650.0000 mg | ORAL_TABLET | ORAL | Status: DC | PRN
  Administered 2024-04-23: 650 mg via ORAL
  Filled 2024-04-22: qty 2
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
## 2024-04-22 NOTE — ED Triage Notes (Signed)
 The pt reports that he has a clot in his lt thigh and is on blood thinners  he reports that he was here yesterday
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
## 2024-04-23 DIAGNOSIS — M79604 Pain in right leg: Secondary | ICD-10-CM | POA: Diagnosis not present

## 2024-04-23 MED ORDER — LORAZEPAM 1 MG PO TABS
1.0000 mg | ORAL_TABLET | Freq: Once | ORAL | Status: AC
  Administered 2024-04-23: 1 mg via ORAL
  Filled 2024-04-23: qty 1

## 2024-04-23 NOTE — ED Notes (Signed)
 DC NIHSS per previous RN
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
## 2024-04-23 NOTE — Plan of Care (Signed)
 ENT Plan of Care: Evaluated patient. No mandibular mobility/stepoffs noted, wear facets do align. He is tolerating no chew diet. Given this, appropriate to transition to regular diet. He was advised to call our office if any issues. Follow up as needed. Ziva Nunziata B Lesleyanne Politte

## 2024-04-24 ENCOUNTER — Ambulatory Visit: Payer: Self-pay

## 2024-04-24 ENCOUNTER — Other Ambulatory Visit: Payer: Self-pay | Admitting: Family Medicine

## 2024-04-24 ENCOUNTER — Telehealth: Admitting: Family Medicine

## 2024-04-24 DIAGNOSIS — M25572 Pain in left ankle and joints of left foot: Secondary | ICD-10-CM
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
## 2024-04-27 ENCOUNTER — Other Ambulatory Visit (HOSPITAL_COMMUNITY): Payer: Self-pay

## 2024-04-27 ENCOUNTER — Ambulatory Visit: Payer: Self-pay | Admitting: *Deleted
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
## 2024-04-28 ENCOUNTER — Other Ambulatory Visit: Payer: Self-pay | Admitting: Family Medicine

## 2024-04-28 NOTE — Telephone Encounter (Signed)
 Called patient; couldn't leave VM. I reached out to the alternate number(spouse) I asked her to have patient contact the office.

## 2024-04-28 NOTE — Telephone Encounter (Signed)
 Please call the patient and let him know I can send in some additional pain medication for the three days until he sees me.  Also please let him know that he should not be taking anything like ibuprofen , advil , alleve or other nsaids if he is taking the eliquis .

## 2024-04-29 ENCOUNTER — Emergency Department (HOSPITAL_COMMUNITY)

## 2024-04-29 ENCOUNTER — Other Ambulatory Visit: Payer: Self-pay | Admitting: Family Medicine

## 2024-04-29 ENCOUNTER — Telehealth: Payer: Self-pay

## 2024-04-29 ENCOUNTER — Other Ambulatory Visit: Payer: Self-pay

## 2024-04-29 ENCOUNTER — Emergency Department (HOSPITAL_COMMUNITY)
Admission: EM | Admit: 2024-04-29 | Discharge: 2024-04-29 | Discharge status: Against Medical Advice | Attending: Emergency Medicine | Admitting: Emergency Medicine

## 2024-04-29 ENCOUNTER — Encounter (HOSPITAL_COMMUNITY): Payer: Self-pay | Admitting: Emergency Medicine

## 2024-04-29 DIAGNOSIS — S0990XA Unspecified injury of head, initial encounter: Secondary | ICD-10-CM | POA: Insufficient documentation

## 2024-04-29 DIAGNOSIS — Z86718 Personal history of other venous thrombosis and embolism: Secondary | ICD-10-CM | POA: Insufficient documentation

## 2024-04-29 DIAGNOSIS — Z7901 Long term (current) use of anticoagulants: Secondary | ICD-10-CM | POA: Insufficient documentation

## 2024-04-29 DIAGNOSIS — S80212A Abrasion, left knee, initial encounter: Secondary | ICD-10-CM | POA: Insufficient documentation

## 2024-04-29 DIAGNOSIS — M25571 Pain in right ankle and joints of right foot: Secondary | ICD-10-CM | POA: Insufficient documentation

## 2024-04-29 DIAGNOSIS — M549 Dorsalgia, unspecified: Secondary | ICD-10-CM | POA: Diagnosis not present

## 2024-04-29 DIAGNOSIS — S8992XA Unspecified injury of left lower leg, initial encounter: Secondary | ICD-10-CM | POA: Diagnosis present

## 2024-04-29 LAB — RAPID URINE DRUG SCREEN, HOSP PERFORMED
Amphetamines: NOT DETECTED
Barbiturates: NOT DETECTED
Benzodiazepines: POSITIVE — AB
Cocaine: POSITIVE — AB
Opiates: NOT DETECTED
Tetrahydrocannabinol: POSITIVE — AB

## 2024-04-29 MED ORDER — ACETAMINOPHEN 500 MG PO TABS
1000.0000 mg | ORAL_TABLET | Freq: Once | ORAL | Status: AC
  Administered 2024-04-29: 1000 mg via ORAL
  Filled 2024-04-29: qty 2

## 2024-04-29 MED ORDER — OXYCODONE HCL 5 MG PO TABS: 5.0000 mg | ORAL_TABLET | Freq: Once | ORAL | Status: DC

## 2024-04-29 MED ORDER — IBUPROFEN 400 MG PO TABS: 600.0000 mg | ORAL_TABLET | Freq: Once | ORAL | Status: DC

## 2024-04-29 MED ORDER — OXYCODONE HCL 10 MG PO TABS: 10.0000 mg | ORAL_TABLET | Freq: Four times a day (QID) | ORAL | 0 refills | Status: DC | PRN
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
## 2024-04-29 NOTE — Telephone Encounter (Signed)
Rx will be sent   

## 2024-04-29 NOTE — Telephone Encounter (Signed)
 Medication has been sent in

## 2024-04-29 NOTE — ED Notes (Signed)
 Pt left via elopement. Pt left prior to receiving pain meds or VS being obtained. Pt ambulated to lobby with c collar and boot on R foot, A&Ox4.
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
## 2024-04-29 NOTE — Telephone Encounter (Signed)
 Copied from CRM [id number]. Topic: General - Other >> Apr 29, 2024 10:36 AM Hunter Cook wrote: Reason for CRM: Pt called back and verbalized understanding of the message from PCP. Is requesting to have his pain medication sent to  [facility] - [place], [place] - [address] [name] [address] Phone: [phone] Fax: [phone]

## 2024-04-29 NOTE — ED Notes (Signed)
 Officer personnel remains at bedside speaking with pt. Privacy dividers placed in hallway area.
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
## 2024-04-29 NOTE — Telephone Encounter (Signed)
 Copied from CRM [id number]. Topic: General - Other >> Apr 29, 2024 10:36 AM Baldemar Lev wrote: Reason for CRM: Pt called back and verbalized understanding of the message from PCP. Is requesting to have his pain medication sent to  [facility] - [place], [place] - [address] [place] [address] Phone: [phone] Fax: [phone]   Patient is agreeable for pain medication to be sent

## 2024-05-01 ENCOUNTER — Ambulatory Visit (INDEPENDENT_AMBULATORY_CARE_PROVIDER_SITE_OTHER): Admitting: Family Medicine

## 2024-05-01 ENCOUNTER — Encounter: Payer: Self-pay | Admitting: Family Medicine

## 2024-05-01 VITALS — BP 144/104 | HR 120 | Ht 69.0 in | Wt 213.2 lb

## 2024-05-01 DIAGNOSIS — S86011S Strain of right Achilles tendon, sequela: Secondary | ICD-10-CM | POA: Diagnosis not present

## 2024-05-01 DIAGNOSIS — I82432 Acute embolism and thrombosis of left popliteal vein: Secondary | ICD-10-CM

## 2024-05-01 DIAGNOSIS — S02652S Fracture of angle of left mandible, sequela: Secondary | ICD-10-CM

## 2024-05-01 DIAGNOSIS — I82411 Acute embolism and thrombosis of right femoral vein: Secondary | ICD-10-CM | POA: Diagnosis not present

## 2024-05-01 DIAGNOSIS — F1910 Other psychoactive substance abuse, uncomplicated: Secondary | ICD-10-CM

## 2024-05-01 DIAGNOSIS — S22009S Unspecified fracture of unspecified thoracic vertebra, sequela: Secondary | ICD-10-CM

## 2024-05-01 DIAGNOSIS — S2242XA Multiple fractures of ribs, left side, initial encounter for closed fracture: Secondary | ICD-10-CM

## 2024-05-01 DIAGNOSIS — Z8659 Personal history of other mental and behavioral disorders: Secondary | ICD-10-CM

## 2024-05-01 MED ORDER — MUPIROCIN 2 % EX OINT: 1.0000 | TOPICAL_OINTMENT | Freq: Two times a day (BID) | CUTANEOUS | 2 refills | Status: AC

## 2024-05-01 MED ORDER — OXYCODONE HCL 10 MG PO TABS: 10.0000 mg | ORAL_TABLET | Freq: Four times a day (QID) | ORAL | 0 refills | Status: AC | PRN

## 2024-05-01 MED ORDER — APIXABAN 5 MG PO TABS: 5.0000 mg | ORAL_TABLET | Freq: Two times a day (BID) | ORAL | 2 refills | Status: AC

## 2024-05-01 MED ORDER — NALOXONE HCL 4 MG/0.1ML NA LIQD: NASAL | 1 refills | Status: AC

## 2024-05-01 NOTE — Patient Instructions (Signed)
 It was nice to see you today,  We addressed the following topics today: -I am going to send in a referral to social worker to will help you with your housing and transportation situation hopefully - I am sending in oxycodone  for use for the next 5 days.  Use this sparingly. - Before you use your oxycodone  I would like you to make sure you did the following: Take 1000 mg of Tylenol  every 8 hours.  Do not take more than 4000 mg of Tylenol  in a day.  This includes any other medications that may have Tylenol  in them.;  Use heating pad for your back.  Use any over-the-counter topicals you can use. - Do not use any NSAIDs (Aleve, ibuprofen , Advil , Goody's powder, aspirin , BC powder) or recreational drugs while taking the oxycodone  and Eliquis . - I would like you to take your Eliquis  twice a day to prevent blood clot from going to your lungs. - I am sending a referral to physical medicine rehab. - I have provided you with some supplies for your wound. Every day I would like you to wash the wound with soap and water , dried off then apply the antibiotic ointment I have prescribed, then like the gauze and wrap with an Ace bandage.  If it starts to appear like it is becoming infected you should go to the emergency department.  Have a great day,  Etha Henle, MD
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
## 2024-05-04 ENCOUNTER — Ambulatory Visit: Payer: Self-pay

## 2024-05-04 ENCOUNTER — Telehealth: Payer: Self-pay

## 2024-05-04 ENCOUNTER — Encounter: Payer: Self-pay | Admitting: Family Medicine

## 2024-05-04 ENCOUNTER — Other Ambulatory Visit: Payer: Self-pay | Admitting: Family Medicine

## 2024-05-04 DIAGNOSIS — I82411 Acute embolism and thrombosis of right femoral vein: Secondary | ICD-10-CM | POA: Insufficient documentation

## 2024-05-04 DIAGNOSIS — Z8659 Personal history of other mental and behavioral disorders: Secondary | ICD-10-CM | POA: Insufficient documentation

## 2024-05-04 DIAGNOSIS — S86011A Strain of right Achilles tendon, initial encounter: Secondary | ICD-10-CM | POA: Insufficient documentation

## 2024-05-04 DIAGNOSIS — F1910 Other psychoactive substance abuse, uncomplicated: Secondary | ICD-10-CM | POA: Insufficient documentation

## 2024-05-04 DIAGNOSIS — S02652A Fracture of angle of left mandible, initial encounter for closed fracture: Secondary | ICD-10-CM | POA: Insufficient documentation

## 2024-05-04 DIAGNOSIS — S22009A Unspecified fracture of unspecified thoracic vertebra, initial encounter for closed fracture: Secondary | ICD-10-CM | POA: Insufficient documentation

## 2024-05-04 NOTE — Telephone Encounter (Signed)
 It was discussed with pt at his visit that we would not refill opioid prescriptions early.  Will discuss with him other options for pain mgmt.

## 2024-05-04 NOTE — Progress Notes (Signed)
 Complex Care Management Note  Care Guide Note 05/04/2024 Name: Hunter Cook MRN: [id number] DOB: [birth year]  Hunter Cook is a 28 y.o. year old male who sees Laneta Pintos, MD for primary care. I reached out to [employer] by phone today to offer complex care management services.  Hunter Cook was given information about Complex Care Management services today including:   The Complex Care Management services include support from the care team which includes your Nurse Care Manager, Clinical Social Worker, or Pharmacist.  The Complex Care Management team is here to help remove barriers to the health concerns and goals most important to you. Complex Care Management services are voluntary, and the patient may decline or stop services at any time by request to their care team member.   Complex Care Management Consent Status: Patient agreed to services and verbal consent obtained.   Follow up plan:  Telephone appointment with complex care management team member scheduled for:  LCSW 05/05/2024 and RNCM 05/08/2024  Encounter Outcome:  Patient Scheduled  Lenton Rail , RMA     [place]  [facility], [facility] Guide  Direct Dial: [phone]  Website: [email or web address]

## 2024-05-04 NOTE — Telephone Encounter (Signed)
 Copied from CRM [id number]. Topic: Clinical - Medication Refill >> May 04, 2024  3:23 PM Talmadge Fail S wrote: Medication: Oxycodone  HCl 10 MG TABS   Has the patient contacted their pharmacy? Yes (Agent: If no, request that the patient contact the pharmacy for the refill. If patient does not wish to contact the pharmacy document the reason why and proceed with request.) (Agent: If yes, when and what did the pharmacy advise?)  This is the patient's preferred pharmacy:  [facility] - [place], [place] - [address] [place] [address] Phone: [phone] Fax: [phone]  Is this the correct pharmacy for this prescription? Yes If no, delete pharmacy and type the correct one.   Has the prescription been filled recently? Yes  Is the patient out of the medication? Yes  Has the patient been seen for an appointment in the last year OR does the patient have an upcoming appointment? Yes  Can we respond through MyChart? Yes  Agent: Please be advised that Rx refills may take up to 3 business days. We ask that you follow-up with your pharmacy.

## 2024-05-04 NOTE — Assessment & Plan Note (Addendum)
 Pt was advised to f/u with dr. Lydia Sams outpatient. does not appear to have appt set up.  Transportaiton is a barrier to f/u.  Sending in VBCI referral for help with transportation, care coordination. Advised to continue puree'd diet for one week after discharge.  Pt endorses still not being able to eat solids.
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
## 2024-05-04 NOTE — Assessment & Plan Note (Signed)
 Was cleared by psych prior to d/c.  No SI today.

## 2024-05-05 ENCOUNTER — Telehealth: Payer: Self-pay | Admitting: Licensed Clinical Social Worker

## 2024-05-07 ENCOUNTER — Other Ambulatory Visit: Payer: Self-pay | Admitting: Licensed Clinical Social Worker

## 2024-05-07 NOTE — Patient Instructions (Signed)
 Visit Information  Thank you for taking time to visit with me today. Please don't hesitate to contact me if I can be of assistance to you before our next scheduled appointment.   Please call the care guide team at [phone] if you need to cancel or reschedule your appointment.   Following is a copy of your care plan:   Goals Addressed             This Visit's Progress    COMPLETED: VBCI Social Work Care Plan       Problems:   Housing  and Transportation  CSW Clinical Goal(s):   Over the next 4 weeks the Patient will increase familiarization of local resources and how to access them.  Interventions:  Social Determinants of Health in Patient with TBI: SDOH assessments completed: Psychologist, educational of current treatment plan related to unmet needs Provided resources for [employer] and [employer]  Patient Goals/Self-Care Activities:  Review Agricultural engineer on community resources.  Plan:   No further follow up required: Please reach out to care guide if you would like to reconnect.        Please call the Suicide and Crisis Lifeline: 988 if you are experiencing a Mental Health or Behavioral Health Crisis or need someone to talk to.  Patient verbalizes understanding of instructions and care plan provided today and agrees to view in MyChart. Active MyChart status and patient understanding of how to access instructions and care plan via MyChart confirmed with patient.     Hale Level, LCSW [place], [facility] Licensed Clinical Social Worker Care Coordinator [phone]

## 2024-05-07 NOTE — Patient Outreach (Signed)
 Complex Care Management   Visit Note  05/07/2024  Name:  Hunter Cook MRN: [id number] DOB: 03-Dec-1995  Situation: Referral received for Complex Care Management related to TBI I obtained verbal consent from Patient.  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   Snake bite    TBI (traumatic brain injury) (HCC) 2020   Tracheostomy tube present (HCC)    Trimalleolar fracture of ankle, closed, left, initial encounter 10/17/2016    Assessment: Patient Reported Symptoms:  Cognitive Cognitive Status: Alert and oriented to person, place, and time, Normal speech and language skills Cognitive/Intellectual Conditions Management [RPT]: Brain Injury Brain Injury: TBI   Healing Pattern: Unsure  Neurological   Neurological Conditions: Head trauma Neurological Management Strategies: Medication therapy Neurological Self-Management Outcome: 3 (uncertain)  HEENT HEENT Symptoms Reported: Not assessed      Cardiovascular Cardiovascular Symptoms Reported: Not assessed    Respiratory Respiratory Symptoms Reported: Not assesed    Endocrine Patient reports the following symptoms related to hypoglycemia or hyperglycemia : Not assessed    Gastrointestinal Gastrointestinal Symptoms Reported: Not assessed      Genitourinary Genitourinary Symptoms Reported: Not assessed    Integumentary Integumentary Symptoms Reported: Not assessed    Musculoskeletal Musculoskelatal Symptoms Reviewed: Not assessed        Psychosocial Psychosocial Symptoms Reported: No symptoms reported     Quality of Family Relationships: disruptive, helpful, supportive Do you feel physically threatened by others?: No      05/01/2024    9:00 AM  Depression screen PHQ 2/9   Decreased Interest 3  Down, Depressed, Hopeless 3  PHQ - 2 Score 6  Altered sleeping 3  Tired, decreased energy 2  Change in appetite 1  Feeling bad or failure about yourself  3  Trouble concentrating 3  Moving slowly or fidgety/restless 3  Suicidal thoughts 0  PHQ-9 Score 21  Difficult doing work/chores Extremely dIfficult    There were no vitals filed for this visit.  Medications Reviewed Today   Medications were not reviewed in this encounter     Recommendation:   CSW spoke with pt regarding SDOH needs - pt reported he would be evicted at the end of the month and lacked transportation. CSW and pt discussed community options for homelessness and pt agreed to receive that information. Pt declined further assessment. CSW attempted to discuss MH with pt but he decline this. Due to pt risk factors, CSW gave pt information for crisis line if needed and pt acknowledged how to contact them.  Follow Up Plan:   Pt declined further follow up: please reach out to care guide if you would like to reconnect.  Hale Level, LCSW [place], [facility] Licensed Clinical Social Worker Care Coordinator [phone]

## 2024-05-08 ENCOUNTER — Other Ambulatory Visit: Payer: Self-pay

## 2024-05-08 NOTE — Patient Outreach (Signed)
 Complex Care Management   Visit Note  05/08/2024  Name:  Hunter Cook MRN: [id number] DOB: [birth year]  Situation: Referral received for Complex Care Management related to PAIN I obtained verbal consent from Patient.  Visit completed with Mr. Hunter Cook  on the phone.  Mr. Hunter Cook states he main concern today is lower back pain, leg pain, and neck pain.  He has already been to ED on 04/29/24 for assessment, prescribed Tylenol  for pain.  He went to PCP 05/01/24, prescribed 5 more days of oxycodone , this has since run out.    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   Snake bite    TBI (traumatic brain injury) (HCC) 2020   Tracheostomy tube present (HCC)    Trimalleolar fracture of ankle, closed, left, initial encounter 10/17/2016    Assessment: Patient Reported Symptoms:  Cognitive Cognitive Status: Alert and oriented to person, place, and time, Normal speech and language skills Cognitive/Intellectual Conditions Management [RPT]: Brain Injury Brain Injury: TBI      Neurological      HEENT HEENT Symptoms Reported: Not assessed      Cardiovascular Cardiovascular Symptoms Reported: Not assessed    Respiratory Respiratory Symptoms Reported: No symptoms reported    Endocrine Patient reports the following symptoms related to hypoglycemia or hyperglycemia : Not assessed    Gastrointestinal Gastrointestinal Symptoms Reported: Diarrhea Additional Gastrointestinal Details: Reports liquid stools.  He is on a puree/liquid diet due to mandibular fracture. States he isn't able to eat solid foods due to overall pain in lower back/neck/headache.      Genitourinary Genitourinary Symptoms Reported: No symptoms reported    Integumentary Integumentary Symptoms Reported: Wound Additional  Integumentary Details: healing wounds to right heel and lateral malleolus. On 6/6 office visit, PCP gave extra supplies to dress wounds, wash with soap and water , dry off then apply antibiotic ointment, wrap with gauze and ACE bandage, Go to ER if presenting with increased redness/odorous drainage/warmth around the wound. Take Tylenol  500mg  2 tabs every 6 hours for pain Skin Management Strategies: Dressing changes Skin Self-Management Outcome: 3 (uncertain)  Musculoskeletal Musculoskelatal Symptoms Reviewed: Other Other Musculoskeletal Symptoms: Experiencing difficulty with moving around due to lower back pain, rates pain of 10.  States he is using heating pad, has tried Tylenol  as prescribed but not touching the pain.  He was prescribed oxycodone  10mg  but has since run out of med, PCP is not able to prescribe more, he needs to attend follow up appointments with Ortho for plan. [facility] is not able to see him until 05/19/24 for F/U. Musculoskeletal Conditions: Back pain, Mobility limited Falls in the past year?: No    Psychosocial Psychosocial Symptoms Reported: Not assessed            05/01/2024    9:00 AM  Depression screen PHQ 2/9  Decreased Interest 3  Down, Depressed, Hopeless 3  PHQ - 2 Score 6  Altered sleeping 3  Tired, decreased energy 2  Change in appetite 1  Feeling bad or failure about yourself  3  Trouble concentrating 3  Moving slowly or fidgety/restless 3  Suicidal thoughts 0  PHQ-9 Score 21  Difficult doing work/chores Extremely dIfficult    There were no vitals filed for this visit.  Medications  Reviewed Today   Medications were not reviewed in this encounter     Recommendation:   Specialty provider follow-up :first available appt for [facility] is 05/19/24.  This RNCM gave patient location/contact info for [facility] to be seen for back pain.   Follow Up Plan:   Telephone follow-up 3 days.   Jurline Olmsted BSN, CCM [place]  VBCI  Population Health RN Care Manager Direct Dial: [phone]  Fax: [phone]

## 2024-05-08 NOTE — Patient Instructions (Addendum)
 Visit Information  Kazimierz, here is the info for [facility] : [address], [address], [place], [place] [phone].  Hours are M-F 8:00am to 8:00PM, Saturday 10:00am to 3:00pm.   Thank you for taking time to visit with me today. Please don't hesitate to contact me if I can be of assistance to you before our next scheduled appointment.  Our next appointment is by telephone on Monday, June 16 to check in on your back pain.   Please call the care guide team at [phone] if you need to cancel or reschedule your appointment.   Following is a copy of your care plan:   Goals Addressed             This Visit's Progress    VBCI RN Care Plan       Problems:  Chronic Disease Management support and education needs related to Pain in lower back/right leg.   Goal: Over the next 30 days the Patient will demonstrate Improved adherence to prescribed treatment plan for back pain as evidenced by rates pain in lower back less than 4.  Currently rates pain 10/10.   Interventions:   Pain Interventions: Pain assessment performed Medications reviewed Provided education materials for Acute Back Pain. Reviewed provider established plan for pain management Reviewed with patient prescribed pharmacological and nonpharmacological pain relief strategies This RNCM called [facility] to assist in setting up post-ED visit for back pain, first available appt is 05/19/24.  This will not work for patient as his lower back pain is rating at 10/10.     Patient Self-Care Activities:  Take medications as prescribed   Go to [facility] for back pain as soon as possible.    Plan:  The care management team will reach out to the patient again over the next 3 days.             A reminder to ALL patients/family/friends, please call the Suicide and Crisis Lifeline: 988 if you are experiencing a Mental Health or Behavioral Health Crisis or need someone to talk  to.  Patient verbalizes understanding of instructions and care plan provided today and agrees to view in MyChart. Active MyChart status and patient understanding of how to access instructions and care plan via MyChart confirmed with patient.     Jurline Olmsted BSN, CCM [place]  VBCI Population Health RN Care Manager Direct Dial: [phone]  Fax: [phone]

## 2024-05-18 ENCOUNTER — Encounter (HOSPITAL_COMMUNITY): Payer: Self-pay | Admitting: Surgery

## 2024-06-01 ENCOUNTER — Ambulatory Visit: Admitting: Family Medicine

## 2024-06-04 ENCOUNTER — Telehealth: Payer: Self-pay

## 2024-06-08 ENCOUNTER — Telehealth: Payer: Self-pay

## 2024-06-10 ENCOUNTER — Telehealth: Payer: Self-pay
